# Patient Record
Sex: Female | Born: 1945 | Race: White | Hispanic: No | Smoking: Former smoker
Health system: Southern US, Community
[De-identification: ages and names within clinical notes are randomized; demographics above are authoritative.]

## PROBLEM LIST (undated history)

## (undated) DIAGNOSIS — Z87891 Personal history of nicotine dependence: Secondary | ICD-10-CM

## (undated) DIAGNOSIS — F319 Bipolar disorder, unspecified: Secondary | ICD-10-CM

## (undated) HISTORY — DX: Personal history of nicotine dependence: Z87.891

## (undated) HISTORY — PX: ABDOMINAL HYSTERECTOMY: SHX81

## (undated) HISTORY — DX: Bipolar disorder, unspecified: F31.9

---

## 1999-02-24 HISTORY — PX: HAND SURGERY: SHX662

## 2004-12-30 ENCOUNTER — Ambulatory Visit: Payer: Self-pay | Admitting: Internal Medicine

## 2006-12-23 ENCOUNTER — Ambulatory Visit: Payer: Self-pay | Admitting: Internal Medicine

## 2007-08-02 ENCOUNTER — Emergency Department: Payer: Self-pay | Admitting: Emergency Medicine

## 2007-08-12 ENCOUNTER — Emergency Department: Payer: Self-pay | Admitting: Internal Medicine

## 2008-06-11 ENCOUNTER — Ambulatory Visit: Payer: Self-pay | Admitting: Nurse Practitioner

## 2009-07-01 ENCOUNTER — Ambulatory Visit: Payer: Self-pay | Admitting: Nurse Practitioner

## 2011-01-18 ENCOUNTER — Emergency Department: Payer: Self-pay | Admitting: Internal Medicine

## 2011-09-22 ENCOUNTER — Ambulatory Visit: Payer: Self-pay

## 2013-01-10 ENCOUNTER — Ambulatory Visit: Payer: Self-pay

## 2014-01-30 ENCOUNTER — Ambulatory Visit: Payer: Self-pay

## 2014-05-17 DIAGNOSIS — Z720 Tobacco use: Secondary | ICD-10-CM | POA: Insufficient documentation

## 2014-05-17 DIAGNOSIS — F17200 Nicotine dependence, unspecified, uncomplicated: Secondary | ICD-10-CM | POA: Insufficient documentation

## 2014-08-07 ENCOUNTER — Other Ambulatory Visit: Payer: Self-pay

## 2014-08-07 DIAGNOSIS — F333 Major depressive disorder, recurrent, severe with psychotic symptoms: Secondary | ICD-10-CM

## 2014-08-07 MED ORDER — ZIPRASIDONE HCL 80 MG PO CAPS: 80.0000 mg | ORAL_CAPSULE | Freq: Two times a day (BID) | ORAL | Status: DC

## 2014-08-07 NOTE — Telephone Encounter (Signed)
pt called states she needs a refill on ziprasidone hcl 80mg . send to [facility] [address]  Pt has appt set up for 09-06-14

## 2014-08-07 NOTE — Telephone Encounter (Signed)
We'll send in a prescription for ziprasidone 80 mg, #30 with no refills. AW

## 2014-08-13 NOTE — Telephone Encounter (Signed)
PT STATES SHE RECEIVED HER RX AND IS DOING FINE.

## 2014-08-28 ENCOUNTER — Telehealth: Payer: Self-pay | Admitting: Psychiatry

## 2014-08-28 NOTE — Telephone Encounter (Signed)
Pt was given Hydroxyzine 25mg  1tid prn #150 with 1 refill on 07/09/14. She has enough supply of medication and does not need the refill at this time.   Brandy HaleUzma Anniemae Haberkorn, MD

## 2014-09-06 ENCOUNTER — Ambulatory Visit (INDEPENDENT_AMBULATORY_CARE_PROVIDER_SITE_OTHER): Payer: 59 | Admitting: Psychiatry

## 2014-09-06 ENCOUNTER — Encounter: Payer: Self-pay | Admitting: Psychiatry

## 2014-09-06 VITALS — BP 108/68 | HR 51 | Ht 60.0 in | Wt 129.2 lb

## 2014-09-06 DIAGNOSIS — F323 Major depressive disorder, single episode, severe with psychotic features: Secondary | ICD-10-CM | POA: Diagnosis not present

## 2014-09-06 MED ORDER — ZIPRASIDONE HCL 60 MG PO CAPS: 60.0000 mg | ORAL_CAPSULE | Freq: Every day | ORAL | Status: DC

## 2014-09-06 MED ORDER — HYDROXYZINE HCL 25 MG PO TABS: ORAL_TABLET | ORAL | Status: DC

## 2014-09-06 NOTE — Progress Notes (Addendum)
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 She states things continue to go well for her. She states she's not noticed any issues or problems. She states she continues to have itching. She did see a dermatologist for her itching which is localized to the scalp. Patient notes he gets better for about 24 hours when she goes to the hairdresser and then resumes. She does not report any subjective anxiety that comes along with the itching in the scalp. She states the dermatologist told her to discuss the issue with her psychiatrist. I have recommended that she perhaps get another opinion from a dermatologist given that the localized nature of the itching and the response to being in a hair salon are not entirely consistent with a somatic symptom related to anxiety.  We discussed her medication regimen for which she has been stable on for a while. However she has not exhibited any psychotic symptoms and years and her mood is remaining stable. She states in the past she's been told she has bipolar. She did have several hospitalizations but none since 2005. Given her age we've discussed whether perhaps we could decrease her dose or their need to be adjustments in her regimen. We stated that perhaps a first step would be to see if she could do well on a lower dose of Geodon.  Chief Complaint:  Visit Diagnosis:  No diagnosis found.  Past Medical History:  Past Medical History  Diagnosis Date  . Bipolar disorder     Past Surgical History  Procedure Laterality Date  . Hand surgery Right 2001    Patient not sure of what exactly was done.  Repared index finger knuckle area.   Family History: History reviewed. No pertinent family history. Social History:  History   Social History  . Marital Status: Widowed    Spouse Name: N/A  . Number of Children: N/A  . Years of  Education: N/A   Social History Main Topics  . Smoking status: Current Every Day Smoker -- 1.00 packs/day for 31 years    Types: Cigarettes  . Smokeless tobacco: Never Used  . Alcohol Use: No  . Drug Use: No  . Sexual Activity: Not Currently   Other Topics Concern  . None   Social History Narrative  . None   Additional History:   Assessment:   Musculoskeletal: Strength & Muscle Tone: within normal limits Gait & Station: normal, [name] does take small steps Patient leans: N/A  Psychiatric Specialty Exam: HPI  Review of Systems  Psychiatric/Behavioral: Negative for depression, suicidal ideas, hallucinations, memory loss and substance abuse. The patient is not nervous/anxious and does not have insomnia.     Blood pressure 108/68, pulse 51, height 5' (1.524 m), weight 129 lb 3.2 oz (58.605 kg).Body mass index is 25.23 kg/(m^2).  General Appearance: Neat and Well Groomed  Eye Contact:  Good  Speech:  Normal Rate  Volume:  Normal  Mood:  Good  Affect:  Appropriate  Thought Process:  Linear and Logical  Orientation:  Full (Time, Place, and Person)  Thought Content:  Negative  Suicidal Thoughts:  No  Homicidal Thoughts:  No  Memory:  Immediate;   Good Recent;   Good Remote;   Good  Judgement:  Good  Insight:  Good  Psychomotor Activity:  Negative  Concentration:  Good  Recall:  Good  Fund of Knowledge:  Good  Language: Good  Akathisia:  Negative  Handed:  Right unknown   AIMS (if indicated):  normal  Assets:  Communication Skills Desire for Improvement Social Support  ADL's:  Intact  Cognition: WNL  Sleep:  good   Is the patient at risk to self?  No. Has the patient been a risk to self in the past 6 months?  No. Has the patient been a risk to self within the distant past?  No. Is the patient a risk to others?  No. Has the patient been a risk to others in the past 6 months?  No. Has the patient been a risk to others within the distant past?  No.  Current  Medications: Current Outpatient Prescriptions  Medication Sig Dispense Refill  . Calcium Carbonate-Vitamin D 600-400 MG-UNIT per tablet Take by mouth.    . hydrOXYzine (ATARAX/VISTARIL) 25 MG tablet TK 1 T PO TID PRF ITCHING AND 2 TS PO QHS PRF SLEEP/ITCHING  1  . ziprasidone (GEODON) 80 MG capsule Take 1 capsule (80 mg total) by mouth 2 (two) times daily with a meal. 30 capsule 0  . ziprasidone (GEODON) 80 MG capsule Take by mouth.    . ziprasidone (GEODON) 80 MG capsule Take 80 mg by mouth.     No current facility-administered medications for this visit.    Medical Decision Making:  Established Problem, Stable/Improving (1)  Treatment Plan Summary:Medication management and Plan We will decrease the patient's ziprasidone to 60 mg a day. I've encouraged call with any questions or concerns or changes in her status with the decrease. She'll follow up in 1 month. I will also prescribe the Vistaril which she states helps her sleep. She states helps a little bit with the itching but not entirely.   Patient reports the hydroxyzine does help her sleep and helps minimally with the itching. We will provide her with a prescription for this medication.   Wallace Goinglton Mickaela Starlin 09/06/2014, 11:35 AM

## 2014-09-06 NOTE — Addendum Note (Signed)
Addended by: Kerin SalenWILLIAMS, Damyia Strider L on: 09/06/2014 11:50 AM   Modules accepted: Orders

## 2014-09-19 DIAGNOSIS — F319 Bipolar disorder, unspecified: Secondary | ICD-10-CM | POA: Insufficient documentation

## 2014-09-27 ENCOUNTER — Encounter: Payer: Self-pay | Admitting: Psychiatry

## 2014-09-27 ENCOUNTER — Ambulatory Visit (INDEPENDENT_AMBULATORY_CARE_PROVIDER_SITE_OTHER): Payer: 59 | Admitting: Psychiatry

## 2014-09-27 VITALS — BP 100/58 | HR 76 | Temp 97.8°F | Ht 60.0 in | Wt 130.8 lb

## 2014-09-27 DIAGNOSIS — F333 Major depressive disorder, recurrent, severe with psychotic symptoms: Secondary | ICD-10-CM | POA: Diagnosis not present

## 2014-09-27 MED ORDER — ZIPRASIDONE HCL 80 MG PO CAPS: 80.0000 mg | ORAL_CAPSULE | Freq: Every day | ORAL | Status: DC

## 2014-09-27 MED ORDER — HYDROXYZINE HCL 25 MG PO TABS: ORAL_TABLET | ORAL | Status: DC

## 2014-09-27 NOTE — Progress Notes (Signed)
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 At the last visit we had decreased her ziprasidone from 80 mg daily to 60 mg daily. She states that since that change she has noticed she is tired. She states she now has to take a nap pretty much every day. She states that socially she is still doing well and she is able to enjoy things but she has noticed that she is tired even in the middle of the day or late morning since the dose change. She's not noticed any other changes in her mood or perceptions or any development of psychotic symptoms.  She continues to have issues with itching and has seen a dermatologist who has prescribed a cream fluocinolone for her. She states it is not working however she states she was just given it last week.  Chief Complaint:  Visit Diagnosis:  No diagnosis found.  Past Medical History:  Past Medical History  Diagnosis Date  . Bipolar disorder     Past Surgical History  Procedure Laterality Date  . Hand surgery Right 2001    Patient not sure of what exactly was done.  Repared index finger knuckle area.  . Abdominal hysterectomy     Family History:  Family History  Problem Relation Age of Onset  . Arthritis Mother   . Heart attack Father   . Diabetes Father    Social History:  History   Social History  . Marital Status: Widowed    Spouse Name: N/A  . Number of Children: N/A  . Years of Education: N/A   Social History Main Topics  . Smoking status: Current Every Day Smoker -- 1.00 packs/day for 31 years    Types: Cigarettes    Start date: 09/27/1979  . Smokeless tobacco: Never Used  . Alcohol Use: No  . Drug Use: No  . Sexual Activity: Not Currently   Other Topics Concern  . None   Social History Narrative   Additional History:   Assessment:   Musculoskeletal: Strength & Muscle Tone: within normal  limits Gait & Station: normal, Patient does take small steps Patient leans: N/A  Psychiatric Specialty Exam: HPI  Review of Systems  Psychiatric/Behavioral: Negative for depression, suicidal ideas, hallucinations, memory loss and substance abuse. The patient has insomnia (States she sleeps about 7 hours with the Vistaril. Hypersomnia in the daytime as noted above.). The patient is not nervous/anxious.     Blood pressure 100/58, pulse 76, temperature 97.8 F (36.6 C), temperature source Tympanic, height 5' (1.524 m), weight 130 lb 12.8 oz (59.33 kg), SpO2 94 %.Body mass index is 25.54 kg/(m^2).  General Appearance: Neat and Well Groomed  Eye Contact:  Good  Speech:  Normal Rate  Volume:  Normal  Mood:  Good  Affect:  Appropriate  Thought Process:  Linear and Logical  Orientation:  Full (Time, Place, and Person)  Thought Content:  Negative  Suicidal Thoughts:  No  Homicidal Thoughts:  No  Memory:  Immediate;   Good Recent;   Good Remote;   Good  Judgement:  Good  Insight:  Good  Psychomotor Activity:  Negative  Concentration:  Good  Recall:  Good  Fund of Knowledge: Good  Language: Good  Akathisia:  Negative  Handed:  Right unknown   AIMS (if indicated):  normal  Assets:  Communication Skills Desire for Improvement Social Support  ADL's:  Intact  Cognition: WNL  Sleep:  good   Is the patient at risk to self?  No. Has the patient been a risk to self in the past 6 months?  No. Has the patient been a risk to self within the distant past?  No. Is the patient a risk to others?  No. Has the patient been a risk to others in the past 6 months?  No. Has the patient been a risk to others within the distant past?  No.  Current Medications: Current Outpatient Prescriptions  Medication Sig Dispense Refill  . Calcium Carbonate-Vitamin D 600-400 MG-UNIT per tablet Take by mouth.    . hydrOXYzine (ATARAX/VISTARIL) 25 MG tablet 1 tab 3 times daily as needed, 2 tabs at bedtime as  needed 150 tablet 1  . ziprasidone (GEODON) 60 MG capsule Take 1 capsule (60 mg total) by mouth daily with breakfast. 30 capsule 2   No current facility-administered medications for this visit.    Medical Decision Making:  Established Problem, Stable/Improving (1)  Treatment Plan Summary:Medication management and Plan As noted above the patient reports a increased daytime somnolence with the decrease from her ziprasidone from 80 mg daily to 60 ng daily. We will return her to her previous dose of 80 mg daily. She states the Vistaril has been to some extent helpful with her itching and insomnia. We will refill this medication. Thus she will get Vistaril 25 mg 4 times a day as needed for itching and 50 mg at bedtime as needed for insomnia. Patient will follow up in 2 months. She's been encouraged call with any questions or concerns prior to her next appointment.   Patient reports the hydroxyzine does help her sleep and helps minimally with the itching. We will provide her with a prescription for this medication.   Wallace Going 09/27/2014, 11:21 AM

## 2014-11-29 ENCOUNTER — Encounter: Payer: Self-pay | Admitting: Psychiatry

## 2014-11-29 ENCOUNTER — Ambulatory Visit (INDEPENDENT_AMBULATORY_CARE_PROVIDER_SITE_OTHER): Payer: 59 | Admitting: Psychiatry

## 2014-11-29 VITALS — BP 108/76 | HR 94 | Temp 97.3°F | Ht 60.0 in | Wt 133.6 lb

## 2014-11-29 DIAGNOSIS — F323 Major depressive disorder, single episode, severe with psychotic features: Secondary | ICD-10-CM
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
## 2014-12-07 ENCOUNTER — Other Ambulatory Visit: Payer: Self-pay | Admitting: Psychiatry

## 2014-12-08 LAB — LIPID PANEL W/O CHOL/HDL RATIO
CHOLESTEROL TOTAL: 218 mg/dL — AB (ref 100–199)
HDL: 60 mg/dL (ref >39)
LDL Calculated: 127 mg/dL — ABNORMAL HIGH (ref 0–99)
TRIGLYCERIDES: 153 mg/dL — AB (ref 0–149)
VLDL Cholesterol Cal: 31 mg/dL (ref 5–40)

## 2014-12-08 LAB — GLUCOSE, RANDOM: Glucose: 87 mg/dL (ref 65–99)

## 2014-12-08 LAB — PROLACTIN: PROLACTIN: 46.5 ng/mL — AB (ref 4.8–23.3)

## 2014-12-10 ENCOUNTER — Telehealth: Payer: Self-pay | Admitting: Psychiatry

## 2014-12-10 NOTE — Telephone Encounter (Signed)
Patient had her metabolic labs on 16/10/960410/14/2016. Her total cholesterol was elevated at 218, triglycerides were elevated at 153. Her glucose was 87. Her prolactin was elevated at 46.5. I contacted patient to discuss courses of action related to the elevated prolactin such as perhaps changing her to Seroquel or Abilify. Left a message for patient to contact me to discuss these issues. AW

## 2014-12-11 NOTE — Telephone Encounter (Signed)
Spoke with patient informed her laboratory results in regards to lipids and glucose. I spoke with her about her elevated prolactin. I indicated that perhaps she should come in earlier and we can discuss alternatives to Geodon to see if that medicine is contributing to her elevated prolactin at 46. Patient will come in for an earlier appointment. AW

## 2014-12-13 ENCOUNTER — Ambulatory Visit (INDEPENDENT_AMBULATORY_CARE_PROVIDER_SITE_OTHER): Payer: 59 | Admitting: Psychiatry

## 2014-12-13 ENCOUNTER — Encounter: Payer: Self-pay | Admitting: Psychiatry

## 2014-12-13 VITALS — BP 86/60 | HR 92 | Temp 97.7°F | Ht <= 58 in | Wt 133.4 lb

## 2014-12-13 DIAGNOSIS — F323 Major depressive disorder, single episode, severe with psychotic features: Secondary | ICD-10-CM | POA: Diagnosis not present

## 2014-12-13 MED ORDER — ZIPRASIDONE HCL 40 MG PO CAPS: 40.0000 mg | ORAL_CAPSULE | Freq: Every day | ORAL | Status: DC

## 2014-12-13 MED ORDER — ARIPIPRAZOLE 10 MG PO TABS: 10.0000 mg | ORAL_TABLET | Freq: Every day | ORAL | Status: DC

## 2014-12-13 NOTE — Progress Notes (Signed)
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 She comes in for this appointment early because of elevated prolactin levels on her most recent labs. Her prolactin level was 46.5 on 12/07/2014. We have made a decision that we will try to transition her to another agent to rule out the antipsychotic as a cause. Thus I have instructed her to take Geodon 40 mg daily for 7 days and then discontinue the medication. She will then start Abilify 10 mg daily. Adriana Vasquez is agreeable to this plan. She denies any physical symptoms related to hyperprolactinemia such as enlarge breasts or milk production.  Chief Complaint:  Chief Complaint    Follow-up; Medication Refill     Visit Diagnosis:   No diagnosis found.  Past Medical History:  Past Medical History  Diagnosis Date  . Bipolar disorder [facility](HCC)     Past Surgical History  Procedure Laterality Date  . Hand surgery Right 2001    Patient not sure of what exactly was done.  Repared index finger knuckle area.  . Abdominal hysterectomy     Family History:  Family History  Problem Relation Age of Onset  . Arthritis Mother   . Heart attack Father   . Diabetes Father    Social History:  Social History   Social History  . Marital Status: Widowed    Spouse Name: N/A  . Number of Children: N/A  . Years of Education: N/A   Social History Main Topics  . Smoking status: Current Every Day Smoker -- 1.00 packs/day for 31 years    Types: Cigarettes    Start date: 09/27/1979  . Smokeless tobacco: Never Used  . Alcohol Use: No  . Drug Use: No  . Sexual Activity: Not Currently   Other Topics Concern  . None   Social History Narrative   Additional History:   Assessment:   Musculoskeletal: Strength & Muscle Tone: within normal limits Gait & Station: normal, Patient does take small steps Patient leans:  N/A  Psychiatric Specialty Exam: HPI  Review of Systems  Skin: Positive for itching.  Psychiatric/Behavioral: Negative for depression, suicidal ideas, hallucinations, memory loss and substance abuse. The patient has insomnia (States she sleeps about 7 hours with the Vistaril. Hypersomnia in the daytime as noted above.). The patient is not nervous/anxious.   All other systems reviewed and are negative.   Blood pressure 86/60, pulse 92, temperature 97.7 F (36.5 C), temperature source Tympanic, height 4\' 9"  (1.448 m), weight 133 lb 6.4 oz (60.51 kg), SpO2 92 %.Body mass index is 28.86 kg/(m^2).  General Appearance: Neat and Well Groomed  Eye Contact:  Good  Speech:  Normal Rate  Volume:  Normal  Mood:  Good  Affect:  Appropriate  Thought Process:  Linear and Logical  Orientation:  Full (Time, Place, and Person)  Thought Content:  Negative  Suicidal Thoughts:  No  Homicidal Thoughts:  No  Memory:  Immediate;   Good Recent;   Good Remote;   Good  Judgement:  Good  Insight:  Good  Psychomotor Activity:  Negative  Concentration:  Good  Recall:  Good  Fund of Knowledge: Good  Language: Good  Akathisia:  Negative  Handed:  Right unknown   AIMS (if indicated):  normal  Assets:  Communication Skills Desire for Improvement Social Support  ADL's:  Intact  Cognition: WNL  Sleep:  good  Is the patient at risk to self?  No. Has the patient been a risk to self in the past 6 months?  No. Has the patient been a risk to self within the distant past?  No. Is the patient a risk to others?  No. Has the patient been a risk to others in the past 6 months?  No. Has the patient been a risk to others within the distant past?  No.  Current Medications: Current Outpatient Prescriptions  Medication Sig Dispense Refill  . Calcium Carbonate-Vitamin D 600-400 MG-UNIT per tablet Take by mouth.    . hydrOXYzine (ATARAX/VISTARIL) 25 MG tablet Take 1 capsule 4 times a day as needed for  anxiety/itching. Take 2 capsules at bedtime as needed for sleep. 180 tablet 4  . ziprasidone (GEODON) 40 MG capsule Take 1 capsule (40 mg total) by mouth daily after lunch. Take with food. Take this for seven days then discontinue Geodon. 7 capsule 0  . ARIPiprazole (ABILIFY) 10 MG tablet Take 1 tablet (10 mg total) by mouth daily. START THE DAY AFTER LAST DOSE OF GEODON 30 tablet 1   No current facility-administered medications for this visit.    Medical Decision Making:  Established Problem, Stable/Improving (1)  Treatment Plan Summary:Medication management and Plan   Major depressive disorder, recurrent severe with psychotic features. We will discontinue her Geodon. She will taper off of the medication taking 40 mg daily for 7 days and then stop it. She will then start Abilify 10 mg daily. Risk and benefits have been discussing patient's able to consent.   Insomnia-  hydroxyzine does help her sleep and helps minimally with the itching. We will provide her with a prescription for this medication.  Patient will follow up in 1 months. She's been encouraged call any questions or concerns prior to her next appointment.   Wallace Going 12/13/2014, 4:37 PM

## 2014-12-31 ENCOUNTER — Other Ambulatory Visit: Payer: Self-pay | Admitting: Infectious Diseases

## 2014-12-31 DIAGNOSIS — Z1231 Encounter for screening mammogram for malignant neoplasm of breast: Secondary | ICD-10-CM

## 2015-01-14 ENCOUNTER — Ambulatory Visit (INDEPENDENT_AMBULATORY_CARE_PROVIDER_SITE_OTHER): Payer: 59 | Admitting: Psychiatry

## 2015-01-14 ENCOUNTER — Encounter: Payer: Self-pay | Admitting: Psychiatry

## 2015-01-14 VITALS — BP 122/70 | HR 94 | Temp 97.8°F | Ht <= 58 in | Wt 135.0 lb

## 2015-01-14 DIAGNOSIS — F333 Major depressive disorder, recurrent, severe with psychotic symptoms: Secondary | ICD-10-CM

## 2015-01-14 MED ORDER — ARIPIPRAZOLE 10 MG PO TABS: 10.0000 mg | ORAL_TABLET | Freq: Every day | ORAL | Status: DC

## 2015-01-14 NOTE — Patient Instructions (Signed)
HAVE PROLACTIN LAB DONE ON OR AFTER February 06, 2015

## 2015-01-14 NOTE — Progress Notes (Addendum)
BH MD/PA/NP OP Progress Note  01/14/2015 10:34 AM Adriana Vasquez  MRN:  [id number]  Subjective:  Patient presents for follow-up for major depressive disorder with psychotic features. Patient states that she is doing well on the Abilify. Her Geodon was discontinued due to an elevation in prolactin. She states that she has plans for the holidays and she will travel to see her brother. She states an aunt will company her for this trip. She denies any emotional issues at this time. Chief Complaint:  Chief Complaint    Follow-up; Medication Refill     Visit Diagnosis:     ICD-9-CM ICD-10-CM   1. 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 Bipolar disorder [facility])     Past Surgical History  Procedure Laterality Date  . Hand surgery Right 2001    Patient not sure of what exactly was done.  Repared index finger knuckle area.  . Abdominal hysterectomy     Family History:  Family History  Problem Relation Age of Onset  . Arthritis Mother   . Heart attack Father   . Diabetes Father    Social History:  Social History   Social History  . Marital Status: Widowed    Spouse Name: N/A  . Number of Children: N/A  . Years of Education: N/A   Social History Main Topics  . Smoking status: Current Every Day Smoker -- 1.00 packs/day for 31 years    Types: Cigarettes    Start date: 09/27/1979  . Smokeless tobacco: Never Used  . Alcohol Use: No  . Drug Use: No  . Sexual Activity: Not Currently   Other Topics Concern  . None   Social History Narrative   Additional History:   Assessment:   Musculoskeletal: Strength & Muscle Tone: within normal limits Gait & Station: normal, Patient does take small steps Patient leans: N/A  Psychiatric Specialty Exam: HPI  Review of Systems  Skin: Positive for itching.  Psychiatric/Behavioral: Negative for depression, suicidal ideas,  hallucinations, memory loss and substance abuse. The patient is not nervous/anxious and does not have insomnia (States she sleeps about 7 hours with the Vistaril. Hypersomnia in the daytime as noted above.).   All other systems reviewed and are negative.   Blood pressure 122/70, pulse 94, temperature 97.8 F (36.6 C), temperature source Tympanic, height  (1.448 m), weight 135 lb (61.236 kg), SpO2 92 %.Body mass index is 29.21 kg/(m^2).  General Appearance: Neat and Well Groomed  Eye Contact:  Good  Speech:  Normal Rate  Volume:  Normal  Mood:  Good  Affect:  Congruent  Thought Process:  Linear and Logical  Orientation:  Full (Time, Place, and Person)  Thought Content:  Negative  Suicidal Thoughts:  No  Homicidal Thoughts:  No  Memory:  Immediate;   Good Recent;   Good Remote;   Good  Judgement:  Good  Insight:  Good  Psychomotor Activity:  Negative  Concentration:  Good  Recall:  Good  Fund of Knowledge: Good  Language: Good  Akathisia:  Negative  Handed:  Right unknown   AIMS (if indicated):  Done on 01/14/15 normal  Assets:  Communication Skills Desire for Improvement Social Support  ADL's:  Intact  Cognition: WNL  Sleep:  good   Is the patient at risk to self?  No. Has the patient been a risk to self in the past  6 months?  No. Has the patient been a risk to self within the distant past?  No. Is the patient a risk to others?  No. Has the patient been a risk to others in the past 6 months?  No. Has the patient been a risk to others within the distant past?  No.  Current Medications: Current Outpatient Prescriptions  Medication Sig Dispense Refill  . ARIPiprazole (ABILIFY) 10 MG tablet Take 1 tablet (10 mg total) by mouth daily. 30 tablet 2  . Calcium Carbonate-Vitamin D 600-400 MG-UNIT per tablet Take by mouth.    . hydrOXYzine (ATARAX/VISTARIL) 25 MG tablet Take 1 capsule 4 times a day as needed for anxiety/itching. Take 2 capsules at bedtime as needed for  sleep. 180 tablet 4  . PREVNAR 13 SUSP injection INJECT UTD  0   No current facility-administered medications for this visit.    Medical Decision Making:  Established Problem, Stable/Improving (1)  Treatment Plan Summary:Medication management and Plan   Major depressive disorder, recurrent severe with psychotic features. She'll continue the Abilify 10 mg daily.  Insomnia-  hydroxyzine does help her sleep and helps minimally with the itching. We will provide her with a prescription for this medication.   Given her prescription with a laboratory order for a prolactin. Her last prolactin was done on 12/07/2014 and was 46.5. I given instructions to have this lab done on her after 02/06/2015.  Patient will follow up in 2 months. She's been encouraged call any questions or concerns prior to her next appointment.   Wallace Goinglton Evvie Behrmann 01/14/2015, 10:34 AM

## 2015-01-22 ENCOUNTER — Other Ambulatory Visit: Payer: Self-pay | Admitting: Family Medicine

## 2015-01-22 ENCOUNTER — Encounter: Payer: Self-pay | Admitting: Family Medicine

## 2015-01-22 DIAGNOSIS — Z87891 Personal history of nicotine dependence: Secondary | ICD-10-CM | POA: Insufficient documentation

## 2015-01-22 HISTORY — DX: Personal history of nicotine dependence: Z87.891

## 2015-01-23 ENCOUNTER — Ambulatory Visit: Admission: RE | Admit: 2015-01-23 | Payer: Medicare Other | Source: Ambulatory Visit

## 2015-01-23 ENCOUNTER — Encounter: Payer: Self-pay | Admitting: Family Medicine

## 2015-01-23 ENCOUNTER — Inpatient Hospital Stay: Payer: Medicare Other | Attending: Family Medicine | Admitting: Family Medicine

## 2015-01-23 NOTE — Progress Notes (Signed)
Called pharmacy discontinued medication on  01-14-15 due to elevation in prolactin

## 2015-01-25 ENCOUNTER — Encounter: Payer: Self-pay | Admitting: Family Medicine

## 2015-01-25 ENCOUNTER — Ambulatory Visit
Admission: RE | Admit: 2015-01-25 | Discharge: 2015-01-25 | Disposition: A | Discharge status: Home / Self Care | Payer: Medicare Other | Source: Ambulatory Visit | Attending: Family Medicine | Admitting: Family Medicine

## 2015-01-25 ENCOUNTER — Inpatient Hospital Stay: Payer: Medicare Other | Attending: Family Medicine | Admitting: Family Medicine

## 2015-01-25 ENCOUNTER — Other Ambulatory Visit: Payer: Self-pay | Admitting: Family Medicine

## 2015-01-25 DIAGNOSIS — Z87891 Personal history of nicotine dependence: Secondary | ICD-10-CM | POA: Insufficient documentation

## 2015-01-25 DIAGNOSIS — I2510 Atherosclerotic heart disease of native coronary artery without angina pectoris: Secondary | ICD-10-CM | POA: Insufficient documentation

## 2015-01-25 DIAGNOSIS — Z122 Encounter for screening for malignant neoplasm of respiratory organs: Secondary | ICD-10-CM | POA: Diagnosis not present

## 2015-01-25 NOTE — Progress Notes (Addendum)
In accordance with CMS guidelines, patient has meet eligibility criteria including age, absence of signs or symptoms of lung cancer, the specific calculation of cigarette smoking pack-years was 35 years and is a current smoker.   A shared decision-making session was conducted prior to the performance of CT scan. This includes one or more decision aids, includes benefits and harms of screening, follow-up diagnostic testing, over-diagnosis, false positive rate, and total radiation exposure.  Counseling on the importance of adherence to annual lung cancer LDCT screening, impact of co-morbidities, and ability or willingness to undergo diagnosis and treatment is imperative for compliance of the program.  Counseling on the importance of continued smoking cessation for former smokers; the importance of smoking cessation for current smokers and information about tobacco cessation interventions have been given to patient including the [place] at [facility], 1800 quit , as well as Cancer Center specific smoking cessation programs.  Written order for lung cancer screening with LDCT has been given to the patient and any and all questions have been answered to the best of my abilities.   Yearly follow up will be scheduled by Shawn Perkins, Thoracic Navigator.   

## 2015-01-28 ENCOUNTER — Telehealth: Payer: Self-pay | Admitting: *Deleted

## 2015-01-28 NOTE — Telephone Encounter (Signed)
Notified patient of LDCT lung cancer screening results of Lung Rads 2S finding with recommendation for 12 month follow up imaging. Also notified of incidental finding noted below. Patient verbalizes understanding.   IMPRESSION: 1. Lung-RADS Category 2S, benign appearance or behavior. Continue annual screening with low-dose chest CT without contrast in 12 months. 2. The "S" modifier above refers to potentially clinically significant non lung cancer related findings. Specifically, there is atherosclerosis, including left anterior descending coronary artery disease. Please note that although the presence of coronary artery calcium documents the presence of coronary artery disease, the severity of this disease and any potential stenosis cannot be assessed on this non-gated CT examination. Assessment for potential risk factor modification, dietary therapy or pharmacologic therapy may be warranted, if clinically indicated.

## 2015-02-04 ENCOUNTER — Ambulatory Visit
Admission: RE | Admit: 2015-02-04 | Discharge: 2015-02-04 | Disposition: A | Discharge status: Home / Self Care | Payer: Medicare Other | Source: Ambulatory Visit | Attending: Infectious Diseases | Admitting: Infectious Diseases

## 2015-02-04 ENCOUNTER — Other Ambulatory Visit: Payer: Self-pay | Admitting: Infectious Diseases

## 2015-02-04 DIAGNOSIS — Z1231 Encounter for screening mammogram for malignant neoplasm of breast: Secondary | ICD-10-CM

## 2015-02-07 ENCOUNTER — Other Ambulatory Visit: Payer: Self-pay | Admitting: Psychiatry

## 2015-02-08 LAB — PROLACTIN: Prolactin: 26.7 ng/mL — ABNORMAL HIGH (ref 4.8–23.3)

## 2015-02-11 ENCOUNTER — Telehealth: Payer: Self-pay | Admitting: Psychiatry

## 2015-02-11 NOTE — Telephone Encounter (Signed)
Patient today and informed her that her prolactin level had decreased to 26.7. She had an elevated prolactin while on Geodon and now has been on Abilify. I explained to patient that perhaps at her next appointment in January we will check this again to see if it decreases further. AW

## 2015-02-28 ENCOUNTER — Ambulatory Visit: Payer: Self-pay | Admitting: Psychiatry

## 2015-03-13 ENCOUNTER — Encounter: Payer: Self-pay | Admitting: Psychiatry

## 2015-03-13 ENCOUNTER — Ambulatory Visit (INDEPENDENT_AMBULATORY_CARE_PROVIDER_SITE_OTHER): Payer: 59 | Admitting: Psychiatry

## 2015-03-13 VITALS — BP 100/62 | HR 87 | Temp 97.3°F | Ht 60.0 in | Wt 132.6 lb

## 2015-03-13 DIAGNOSIS — F333 Major depressive disorder, recurrent, severe with psychotic symptoms: Secondary | ICD-10-CM

## 2015-03-13 MED ORDER — HYDROXYZINE HCL 25 MG PO TABS: ORAL_TABLET | ORAL | Status: DC

## 2015-03-13 MED ORDER — ARIPIPRAZOLE 10 MG PO TABS: 10.0000 mg | ORAL_TABLET | Freq: Every day | ORAL | Status: DC

## 2015-03-13 NOTE — Progress Notes (Signed)
BH MD/PA/NP OP Progress Note  03/13/2015 10:25 AM Adriana Vasquez  MRN:  [id number]  Subjective:  Patient presents for follow-up for major depressive disorder with psychotic features. Patient indicated that she's been doing well. 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 She indicates the itching may be a little bit better. When asked what she thinks is made her itching better she states "maybe the Abilify." She states she had a recent scare because she had to get evaluated for possible lung cancer however she states it's turned out she's been told it is not and she is relieved by this.  Discussed with her that her prolactin was trending down and that it was closer to the normal range, now at 26.7 in December versus being 46.5 in October 2016. I stated we should follow-up on this. Patient states that due to finances she did not want to go for a lab until her insurance will pay for. She states she has a primary care appointment in May 2017 and I've given her a lab slip to have it reassessed then.  Chief Complaint:  Chief Complaint    Follow-up; Medication Refill; Other     Visit Diagnosis:     ICD-9-CM ICD-10-CM   1. 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 Bipolar disorder (HCC)   . Personal history of tobacco use, presenting hazards to health 01/22/2015    Past Surgical History  Procedure Laterality Date  . Hand surgery Right 2001    Patient not sure of what exactly was done.  Repared index finger knuckle area.  . Abdominal hysterectomy     Family History:  Family History  Problem Relation Age of Onset  . Arthritis Mother   . Heart attack Father   . Diabetes Father   . Breast cancer Neg Hx    Social History:  Social History   Social History  . Marital Status: Widowed    Spouse Name: N/A  . Number of Children: N/A  . Years of Education: N/A   Social  History Main Topics  . Smoking status: Current Every Day Smoker -- 1.00 packs/day for 35 years    Types: Cigarettes    Start date: 09/27/1979  . Smokeless tobacco: Never Used  . Alcohol Use: No  . Drug Use: No  . Sexual Activity: Not Currently   Other Topics Concern  . None   Social History Narrative   Additional History:   Assessment:   Musculoskeletal: Strength & Muscle Tone: within normal limits Gait & Station: normal, Patient does take small steps Patient leans: N/A  Psychiatric Specialty Exam: HPI  Review of Systems  Skin: Positive for itching.  Psychiatric/Behavioral: Negative for depression, suicidal ideas, hallucinations, memory loss and substance abuse. The patient is not nervous/anxious and does not have insomnia (States she sleeps about 7 hours with the Vistaril. Hypersomnia in the daytime as noted above.).   All other systems reviewed and are negative.   Blood pressure 100/62, pulse 87, temperature 97.3 F (36.3 C), temperature source Tympanic, height 5' (1.524 m), weight 132 lb 9.6 oz (60.147 kg), SpO2 92 %.Body mass index is 25.9 kg/(m^2).  General Appearance: Neat and Well Groomed  Eye Contact:  Good  Speech:  Normal Rate  Volume:  Normal  Mood:  Good  Affect:  Congruent  Thought Process:  Linear and Logical  Orientation:  Full (Time, Place,  and Person)  Thought Content:  Negative  Suicidal Thoughts:  No  Homicidal Thoughts:  No  Memory:  Immediate;   Good Recent;   Good Remote;   Good  Judgement:  Good  Insight:  Good  Psychomotor Activity:  Negative  Concentration:  Good  Recall:  Good  Fund of Knowledge: Good  Language: Good  Akathisia:  Negative  Handed:  Right unknown   AIMS (if indicated):  Done on 01/14/15 normal  Assets:  Communication Skills Desire for Improvement Social Support  ADL's:  Intact  Cognition: WNL  Sleep:  good   Is the patient at risk to self?  No. Has the patient been a risk to self in the past 6 months?  No. Has  the patient been a risk to self within the distant past?  No. Is the patient a risk to others?  No. Has the patient been a risk to others in the past 6 months?  No. Has the patient been a risk to others within the distant past?  No.  Current Medications: Current Outpatient Prescriptions  Medication Sig Dispense Refill  . ARIPiprazole (ABILIFY) 10 MG tablet Take 1 tablet (10 mg total) by mouth daily. 30 tablet 4  . Calcium Carbonate-Vitamin D 600-400 MG-UNIT per tablet Take by mouth.    . hydrOXYzine (ATARAX/VISTARIL) 25 MG tablet Take 1 capsule 4 times a day as needed for anxiety/itching. Take 2 capsules at bedtime as needed for sleep. 180 tablet 4   No current facility-administered medications for this visit.    Medical Decision Making:  Established Problem, Stable/Improving (1)  Treatment Plan Summary:Medication management and Plan   Major depressive disorder, recurrent severe with psychotic features. She'll continue the Abilify 10 mg daily.  Insomnia-  hydroxyzine does help her sleep and helps minimally with the itching. We will provide her with a prescription for this medication.   Given her prescription with a laboratory order for a prolactin.   Patient will follow up in 3 months. She's been encouraged call any questions or concerns prior to her next appointment. I've informed her that I am leaving the practice but that she can follow up with a another doctor within this practice.   Wallace Going 03/13/2015, 10:25 AM

## 2015-03-13 NOTE — Patient Instructions (Signed)
HAVE PROLACTIN CHECKED AT NEXT PCP APPOINTMENT IN MAY 2017

## 2015-06-04 ENCOUNTER — Ambulatory Visit: Payer: 59 | Admitting: Psychiatry

## 2015-06-11 ENCOUNTER — Ambulatory Visit: Payer: 59 | Admitting: Psychiatry

## 2015-06-19 ENCOUNTER — Ambulatory Visit: Payer: 59 | Admitting: Psychiatry

## 2015-06-19 ENCOUNTER — Ambulatory Visit (INDEPENDENT_AMBULATORY_CARE_PROVIDER_SITE_OTHER): Payer: 59 | Admitting: Psychiatry

## 2015-06-19 ENCOUNTER — Encounter: Payer: Self-pay | Admitting: Psychiatry

## 2015-06-19 VITALS — BP 110/82 | HR 78 | Temp 97.9°F | Ht 60.0 in | Wt 134.2 lb

## 2015-06-19 DIAGNOSIS — F3131 Bipolar disorder, current episode depressed, mild: Secondary | ICD-10-CM

## 2015-06-19 MED ORDER — ARIPIPRAZOLE 10 MG PO TABS: 5.0000 mg | ORAL_TABLET | Freq: Every day | ORAL | Status: DC

## 2015-06-19 NOTE — Progress Notes (Signed)
BH MD/PA/NP OP Progress Note  06/19/2015 1:32 PM Adriana Vasquez  MRN:  [id number]  Subjective:  Patient is a 70 year old female with history of bipolar disorder who presented for the follow-up appointment. She was previously following Dr. Mayford Knife. She appeared well-groomed and reported that she has been compliant with her medications. She reported that the itching in her head has stopped. She was happy about the same. She reported that she has a weird gait since she has been started on the medications. She spends time shopping as she enjoys feeling different types of jewelry. She reported that her husband used to have custom jewelry shop but now he is deceased for the past 4 years. She reported that she will go to the mall and will buy different stuff. Patient reported that her sister lives in [place] in a nursing home and her brother is in [place]. She currently lives by herself. She reported that she does not feel depressed. She sleeps around midnight. She has been sleeping for 7-8 hours.     Chief Complaint:  Chief Complaint    Follow-up; Medication Refill     Visit Diagnosis:     ICD-9-CM ICD-10-CM   1. MDD (major depressive disorder), recurrent episode, moderate (HCC) 296.32 F33.1     Past Medical History:  Past Medical History  Diagnosis Date  . Bipolar disorder (HCC)   . Personal history of tobacco use, presenting hazards to health 01/22/2015    Past Surgical History  Procedure Laterality Date  . Hand surgery Right 2001    Patient not sure of what exactly was done.  Repared index finger knuckle area.  . Abdominal hysterectomy     Family History:  Family History  Problem Relation Age of Onset  . Arthritis Mother   . Heart attack Father   . Diabetes Father   . Breast cancer Neg Hx    Social History:  Social History   Social History  . Marital Status: Widowed    Spouse Name: N/A  . Number of Children: N/A  . Years of Education: N/A   Social History Main  Topics  . Smoking status: Current Every Day Smoker -- 1.00 packs/day for 35 years    Types: Cigarettes    Start date: 09/27/1979  . Smokeless tobacco: Never Used  . Alcohol Use: No  . Drug Use: No  . Sexual Activity: Not Currently   Other Topics Concern  . None   Social History Narrative   Additional History:     Assessment:   Musculoskeletal: Strength & Muscle Tone: within normal limits Gait & Station: normal, Patient does take small steps Patient leans: N/A  Psychiatric Specialty Exam: HPI  Review of Systems  Skin: Positive for itching.  Psychiatric/Behavioral: Negative for depression, suicidal ideas, hallucinations, memory loss and substance abuse. The patient is not nervous/anxious and does not have insomnia (States she sleeps about 7 hours with the Vistaril. Hypersomnia in the daytime as noted above.).   All other systems reviewed and are negative.   Blood pressure 110/82, pulse 78, temperature 97.9 F (36.6 C), temperature source Tympanic, height 5' (1.524 m), weight 134 lb 3.2 oz (60.873 kg), SpO2 87 %.Body mass index is 26.21 kg/(m^2).  General Appearance: Neat and Well Groomed  Eye Contact:  Good  Speech:  Normal Rate  Volume:  Normal  Mood:  Good  Affect:  Congruent  Thought Process:  Linear and Logical  Orientation:  Full (Time, Place, and Person)  Thought Content:  Negative  Suicidal Thoughts:  No  Homicidal Thoughts:  No  Memory:  Immediate;   Good Recent;   Good Remote;   Good  Judgement:  Good  Insight:  Good  Psychomotor Activity:  Negative  Concentration:  Good  Recall:  Good  Fund of Knowledge: Good  Language: Good  Akathisia:  Negative  Handed:  Right unknown   AIMS (if indicated):  Done on 01/14/15 normal  Assets:  Communication Skills Desire for Improvement Social Support  ADL's:  Intact  Cognition: WNL  Sleep:  good   Is the patient at risk to self?  No. Has the patient been a risk to self in the past 6 months?  No. Has the  patient been a risk to self within the distant past?  No. Is the patient a risk to others?  No. Has the patient been a risk to others in the past 6 months?  No. Has the patient been a risk to others within the distant past?  No.  Current Medications: Current Outpatient Prescriptions  Medication Sig Dispense Refill  . ARIPiprazole (ABILIFY) 10 MG tablet Take 1 tablet (10 mg total) by mouth daily. 30 tablet 4  . Calcium Carbonate-Vitamin D 600-400 MG-UNIT per tablet Take by mouth.    . hydrOXYzine (ATARAX/VISTARIL) 25 MG tablet Take 1 capsule 4 times a day as needed for anxiety/itching. Take 2 capsules at bedtime as needed for sleep. 180 tablet 4   No current facility-administered medications for this visit.    Medical Decision Making:  Established Problem, Stable/Improving (1)  Treatment Plan Summary:Medication management and Plan   Discussed with patient about her medications. I will decrease Abilify 5 mg daily as she is currently stable and is experiencing some adverse reactions leading to gait instability  Insomnia-  hydroxyzine does help her sleep and helps minimally with the itching. We will provide her with a prescription for this medication.    Patient will follow up in 1 months.    More than 50% of the time spent in psychoeducation, counseling and coordination of care.    This note was generated in part or whole with voice recognition software. Voice regonition is usually quite accurate but there are transcription errors that can and very often do occur. I apologize for any typographical errors that were not detected and corrected.   Brandy HaleUzma Jeanetta Alonzo, MD  06/19/2015, 1:32 PM

## 2015-07-01 ENCOUNTER — Telehealth: Payer: Self-pay

## 2015-07-01 NOTE — Telephone Encounter (Signed)
received a fax [facility] Sam[facility]itzgerald [facility] lab[facility].  Will put labwork results on your desk please review and signe off on for scaning.

## 2015-07-01 NOTE — Telephone Encounter (Signed)
Labs reviewed. Her Prolactin level is WNL- 18.7

## 2015-07-17 ENCOUNTER — Ambulatory Visit (INDEPENDENT_AMBULATORY_CARE_PROVIDER_SITE_OTHER): Payer: 59 | Admitting: Psychiatry

## 2015-07-17 ENCOUNTER — Encounter: Payer: Self-pay | Admitting: Psychiatry

## 2015-07-17 VITALS — BP 102/68 | HR 86 | Temp 97.3°F | Wt 134.2 lb

## 2015-07-17 DIAGNOSIS — F3131 Bipolar disorder, current episode depressed, mild: Secondary | ICD-10-CM | POA: Diagnosis not present

## 2015-07-17 MED ORDER — ARIPIPRAZOLE 5 MG PO TABS: 5.0000 mg | ORAL_TABLET | Freq: Every day | ORAL | Status: DC

## 2015-07-17 MED ORDER — HYDROXYZINE HCL 25 MG PO TABS: ORAL_TABLET | ORAL | Status: DC

## 2015-07-17 NOTE — Progress Notes (Signed)
BH MD/PA/NP OP Progress Note  07/17/2015 10:30 AM Adriana Vasquez  MRN:  [id number]  Subjective:  Patient is a 70 year old female with history of bipolar disorder who presented for the follow-up appointment.  She appeared well-groomed and reported that she has been compliant with her medications. I have seen her a month ago and we have decreased the dose of her Abilify to 5 mg daily. Patient appeared well-groomed during the interview. She has responded well to the decrease in the dose of Abilify and is not experiencing any adverse effects. She appeared more alert. She reported that she likes shopping and goes [place]  but is waiting for the sale. Patient reported that she also decorates her house for every occasions. She has taken out the flag for the [facility]. She reported that she goes out shopping shopping only 2 days per week. She reported that she has cleaned her house yesterday and will go out for lunch one day a week with her aunt. She stated that she has a chronic itching in her head which is resolved with the help of the Atarax. She has been compliant with her medications. She currently denied having any suicidal homicidal ideations or plans. Her family lives in [place] and she [place] she thinks that the trucks  are driving fast on the highway. She reported that the itching in her head has stopped. She was happy about the same.  Patient reported that her sister lives in [place] in a nursing home and her brother is in [place]. She currently lives by herself.   Chief Complaint:  Chief Complaint    Follow-up; Medication Refill     Visit Diagnosis:     ICD-9-CM ICD-10-CM   1. Bipolar affective disorder, currently depressed, mild (HCC) 296.51 F31.31     Past Medical History:  Past Medical History  Diagnosis Date  . Bipolar disorder (HCC)   . Personal history of tobacco use, presenting hazards to health 01/22/2015    Past Surgical  History  Procedure Laterality Date  . Hand surgery Right 2001    Patient not sure of what exactly was done.  Repared index finger knuckle area.  . Abdominal hysterectomy     Family History:  Family History  Problem Relation Age of Onset  . Arthritis Mother   . Heart attack Father   . Diabetes Father   . Breast cancer Neg Hx    Social History:  Social History   Social History  . Marital Status: Widowed    Spouse Name: N/A  . Number of Children: N/A  . Years of Education: N/A   Social History Main Topics  . Smoking status: Current Every Day Smoker -- 1.00 packs/day for 35 years    Types: Cigarettes    Start date: 09/27/1979  . Smokeless tobacco: Never Used  . Alcohol Use: No  . Drug Use: No  . Sexual Activity: Not Currently   Other Topics Concern  . None   Social History Narrative   Additional History:     Assessment:   Musculoskeletal: Strength & Muscle Tone: within normal limits Gait & Station: normal, Patient does take small steps Patient leans: N/A  Psychiatric Specialty Exam: HPI  Review of Systems  Skin: Positive for itching.  Psychiatric/Behavioral: Negative for depression, suicidal ideas, hallucinations, memory loss and substance abuse. The patient is not nervous/anxious and does not have insomnia (States she sleeps about 7 hours with the Vistaril. Hypersomnia in  the daytime as noted above.).   All other systems reviewed and are negative.   Blood pressure 102/68, pulse 86, temperature 97.3 F (36.3 C), temperature source Tympanic, weight 134 lb 3.2 oz (60.873 kg), SpO2 92 %.Body mass index is 26.21 kg/(m^2).  General Appearance: Neat and Well Groomed  Eye Contact:  Good  Speech:  Normal Rate  Volume:  Normal  Mood:  Good  Affect:  Congruent  Thought Process:  Linear and Logical  Orientation:  Full (Time, Place, and Person)  Thought Content:  Negative  Suicidal Thoughts:  No  Homicidal Thoughts:  No  Memory:  Immediate;   Good Recent;    Good Remote;   Good  Judgement:  Good  Insight:  Good  Psychomotor Activity:  Negative  Concentration:  Good  Recall:  Good  Fund of Knowledge: Good  Language: Good  Akathisia:  Negative  Handed:  Right unknown   AIMS (if indicated):  Done on 01/14/15 normal  Assets:  Communication Skills Desire for Improvement Social Support  ADL's:  Intact  Cognition: WNL  Sleep:  good   Is the patient at risk to self?  No. Has the patient been a risk to self in the past 6 months?  No. Has the patient been a risk to self within the distant past?  No. Is the patient a risk to others?  No. Has the patient been a risk to others in the past 6 months?  No. Has the patient been a risk to others within the distant past?  No.  Current Medications: Current Outpatient Prescriptions  Medication Sig Dispense Refill  . ARIPiprazole (ABILIFY) 10 MG tablet Take 0.5 tablets (5 mg total) by mouth daily. Pt has supply 30 tablet 4  . Calcium Carbonate-Vitamin D 600-400 MG-UNIT per tablet Take by mouth.    . hydrOXYzine (ATARAX/VISTARIL) 25 MG tablet Take 1 capsule 4 times a day as needed for anxiety/itching. Take 2 capsules at bedtime as needed for sleep. 180 tablet 4   No current facility-administered medications for this visit.    Medical Decision Making:  Established Problem, Stable/Improving (1)  Treatment Plan Summary:Medication management and Plan   Continue Abilify 5 mg daily as she is currently stable   Insomnia-  hydroxyzine does help her sleep and helps minimally with the itching. We will provide her with a prescription for this medication.    Patient will follow up in 2 months.    More than 50% of the time spent in psychoeducation, counseling and coordination of care.    This note was generated in part or whole with voice recognition software. Voice regonition is usually quite accurate but there are transcription errors that can and very often do occur. I apologize for any typographical  errors that were not detected and corrected.   Brandy Hale, MD  07/17/2015, 10:30 AM

## 2015-07-25 ENCOUNTER — Other Ambulatory Visit: Payer: Self-pay | Admitting: Psychiatry

## 2015-07-25 ENCOUNTER — Telehealth: Payer: Self-pay

## 2015-07-25 MED ORDER — ARIPIPRAZOLE 5 MG PO TABS: 5.0000 mg | ORAL_TABLET | Freq: Every day | ORAL | Status: DC

## 2015-07-25 NOTE — Telephone Encounter (Signed)
per lea pt called states that she needs refill on her medication. pt last seen on  07-17-15 next appt  09-16-15

## 2015-09-16 ENCOUNTER — Ambulatory Visit: Payer: 59 | Admitting: Psychiatry

## 2015-09-18 ENCOUNTER — Encounter: Payer: Self-pay | Admitting: Psychiatry

## 2015-09-18 ENCOUNTER — Ambulatory Visit (INDEPENDENT_AMBULATORY_CARE_PROVIDER_SITE_OTHER): Payer: 59 | Admitting: Psychiatry

## 2015-09-18 VITALS — BP 103/61 | HR 83 | Temp 98.4°F | Ht 60.0 in | Wt 135.2 lb

## 2015-09-18 DIAGNOSIS — F3131 Bipolar disorder, current episode depressed, mild: Secondary | ICD-10-CM

## 2015-09-18 MED ORDER — TRAZODONE HCL 50 MG PO TABS: 50.0000 mg | ORAL_TABLET | Freq: Every day | ORAL | 1 refills | Status: DC

## 2015-09-18 MED ORDER — ARIPIPRAZOLE 5 MG PO TABS: 5.0000 mg | ORAL_TABLET | Freq: Every day | ORAL | 1 refills | Status: DC

## 2015-09-18 MED ORDER — HYDROXYZINE HCL 25 MG PO TABS: ORAL_TABLET | ORAL | 1 refills | Status: DC

## 2015-09-18 NOTE — Progress Notes (Signed)
BH MD/PA/NP OP Progress Note  09/18/2015 10:31 AM Adriana Vasquez  MRN:  [id number]  Subjective:  Patient is a 70 year old female with history of bipolar disorder who presented for the follow-up appointment.  She appeared well-groomed and reported that she has been compliant with her medications. Patient has been going to the gym and neck sizing. Patient reported that she is doing well since the dose of Abilify has been decreased. She currently has been compliant with her medication. She also takes Vistaril 4 times daily as she is scared that she might have return of her age but she is not having any itches at this time. She has been taking care of herself and has been well-groomed. She likes wearing clothes and jewelry. She reported that she cleans her house and talks to her friends and her family members as her brother lives close to [facility]. She currently denied having any suicidal ideations or plans. She reported that she has some problem with sleep and would like to take the medication for the same. We discussed about the medications. She appeared calm and collective during the interview. She denied having any suicidal homicidal ideations or plans.     Chief Complaint:  Chief Complaint    Follow-up; Medication Refill     Visit Diagnosis:     ICD-9-CM ICD-10-CM   1. Bipolar affective disorder, currently depressed, mild (HCC) 296.51 F31.31     Past Medical History:  Past Medical History:  Diagnosis Date  . Bipolar disorder (HCC)   . Personal history of tobacco use, presenting hazards to health 01/22/2015    Past Surgical History:  Procedure Laterality Date  . ABDOMINAL HYSTERECTOMY    . HAND SURGERY Right 2001   Patient not sure of what exactly was done.  Repared index finger knuckle area.   Family History:  Family History  Problem Relation Age of Onset  . Arthritis Mother   . Heart attack Father   . Diabetes Father   . Breast cancer Neg Hx    Social History:  Social  History   Social History  . Marital status: Widowed    Spouse name: N/A  . Number of children: N/A  . Years of education: N/A   Social History Main Topics  . Smoking status: Current Every Day Smoker    Packs/day: 1.00    Years: 35.00    Types: Cigarettes    Start date: 09/27/1979  . Smokeless tobacco: Never Used  . Alcohol use No  . Drug use: No  . Sexual activity: Not Currently   Other Topics Concern  . None   Social History Narrative  . None   Additional History:     Assessment:   Musculoskeletal: Strength & Muscle Tone: within normal limits Gait & Station: normal, Patient does take small steps Patient leans: N/A  Psychiatric Specialty Exam: HPI  Review of Systems  Skin: Positive for itching.  Psychiatric/Behavioral: Negative for depression, hallucinations, memory loss, substance abuse and suicidal ideas. The patient is not nervous/anxious and does not have insomnia (States she sleeps about 7 hours with the Vistaril. Hypersomnia in the daytime as noted above.).   All other systems reviewed and are negative.   Blood pressure 103/61, pulse 83, temperature 98.4 F (36.9 C), temperature source Oral, height 5' (1.524 m), weight 135 lb 3.2 oz (61.3 kg).Body mass index is 26.4 kg/m.  General Appearance: Neat and Well Groomed  Eye Contact:  Good  Speech:  Normal Rate  Volume:  Normal  Mood:  Good  Affect:  Congruent  Thought Process:  Linear and Logical  Orientation:  Full (Time, Place, and Person)  Thought Content:  Negative  Suicidal Thoughts:  No  Homicidal Thoughts:  No  Memory:  Immediate;   Good Recent;   Good Remote;   Good  Judgement:  Good  Insight:  Good  Psychomotor Activity:  Negative  Concentration:  Good  Recall:  Good  Fund of Knowledge: Good  Language: Good  Akathisia:  Negative  Handed:  Right unknown   AIMS (if indicated):  Done on 01/14/15 normal  Assets:  Communication Skills Desire for Improvement Social Support  ADL's:  Intact   Cognition: WNL  Sleep:  good   Is the patient at risk to self?  No. Has the patient been a risk to self in the past 6 months?  No. Has the patient been a risk to self within the distant past?  No. Is the patient a risk to others?  No. Has the patient been a risk to others in the past 6 months?  No. Has the patient been a risk to others within the distant past?  No.  Current Medications: Current Outpatient Prescriptions  Medication Sig Dispense Refill  . ARIPiprazole (ABILIFY) 5 MG tablet Take 1 tablet (5 mg total) by mouth daily. 30 tablet 1  . Calcium Carbonate-Vitamin D 600-400 MG-UNIT per tablet Take by mouth.    . hydrOXYzine (ATARAX/VISTARIL) 25 MG tablet Take 1 capsule 4 times a day as needed for anxiety/itching. Take 2 capsules at bedtime as needed for sleep. 180 tablet 4   No current facility-administered medications for this visit.     Medical Decision Making:  Established Problem, Stable/Improving (1)  Treatment Plan Summary:Medication management and Plan   Continue Abilify 5 mg daily as she is currently stable   Insomnia- Change hydroxyzine 25 mg by mouth 3 times a day. I will also add trazodone 25-50 mg by mouth daily at bedtime when necessary and she agreed with the plan.   Patient will follow up in 2 months.    More than 50% of the time spent in psychoeducation, counseling and coordination of care.    This note was generated in part or whole with voice recognition software. Voice regonition is usually quite accurate but there are transcription errors that can and very often do occur. I apologize for any typographical errors that were not detected and corrected.   Brandy Hale, MD  09/18/2015, 10:31 AM

## 2015-11-20 ENCOUNTER — Encounter: Payer: Self-pay | Admitting: Psychiatry

## 2015-11-20 ENCOUNTER — Ambulatory Visit (INDEPENDENT_AMBULATORY_CARE_PROVIDER_SITE_OTHER): Payer: 59 | Admitting: Psychiatry

## 2015-11-20 VITALS — BP 105/69 | HR 60 | Temp 98.6°F | Wt 139.4 lb

## 2015-11-20 DIAGNOSIS — F3131 Bipolar disorder, current episode depressed, mild: Secondary | ICD-10-CM | POA: Diagnosis not present

## 2015-11-20 MED ORDER — TRAZODONE HCL 50 MG PO TABS: 50.0000 mg | ORAL_TABLET | Freq: Every day | ORAL | 1 refills | Status: DC

## 2015-11-20 MED ORDER — ARIPIPRAZOLE 5 MG PO TABS: 5.0000 mg | ORAL_TABLET | Freq: Every day | ORAL | 1 refills | Status: DC

## 2015-11-20 MED ORDER — HYDROXYZINE HCL 10 MG PO TABS: ORAL_TABLET | ORAL | 1 refills | Status: DC

## 2015-11-20 NOTE — Progress Notes (Signed)
BH MD/PA/NP OP Progress Note  11/20/2015 10:30 AM Adriana Vasquez  MRN:  [id number]  Subjective:  Patient is a 69 year old female with history of bipolar disorder who presented for the follow-up appointment.  She appeared well-groomed as usual.Pt  reported that she was planting pansies since yesterday and her knees hurting today. She was discussing in detail about the winter flowers. She appears well dressed and reported that she takes care of herself. Patient stated that she goes out for lunch once a week with her friend. She stated that she went to the beach with her brother and sister-in-law. She reported that she watches [employer] channel and also watch different shows on the television. She listens to the music and goes to the gym at least twice a week. She has been taking care of herself and has been well-groomed. She reported that her brother has also noticed a difference since the dose of her Abilify has been decreased. She was taking Vistaril due to the itching in her head but she is willing to decrease the dose at this time. She sleeps well at night. She reported that she cleans her house every Monday. She does not wear makeup that day. She appears calm and alert during the interview.    She denied having any suicidal homicidal ideations or plans.     Chief Complaint:  Chief Complaint    Follow-up; Medication Refill     Visit Diagnosis:     ICD-9-CM ICD-10-CM   1. Bipolar affective disorder, currently depressed, mild (HCC) 296.51 F31.31     Past Medical History:  Past Medical History:  Diagnosis Date  . Bipolar disorder (HCC)   . Personal history of tobacco use, presenting hazards to health 01/22/2015    Past Surgical History:  Procedure Laterality Date  . ABDOMINAL HYSTERECTOMY    . HAND SURGERY Right 2001   Patient not sure of what exactly was done.  Repared index finger knuckle area.   Family History:  Family History  Problem Relation Age of Onset  . Arthritis Mother   .  Heart attack Father   . Diabetes Father   . Breast cancer Neg Hx    Social History:  Social History   Social History  . Marital status: Widowed    Spouse name: N/A  . Number of children: N/A  . Years of education: N/A   Social History Main Topics  . Smoking status: Current Every Day Smoker    Packs/day: 1.00    Years: 35.00    Types: Cigarettes    Start date: 09/27/1979  . Smokeless tobacco: Never Used  . Alcohol use No  . Drug use: No  . Sexual activity: Not Currently   Other Topics Concern  . None   Social History Narrative  . None   Additional History:     Assessment:   Musculoskeletal: Strength & Muscle Tone: within normal limits Gait & Station: normal, Patient does take small steps Patient leans: N/A  Psychiatric Specialty Exam: Medication Refill     Review of Systems  Skin: Positive for itching.  Psychiatric/Behavioral: Negative for depression, hallucinations, memory loss, substance abuse and suicidal ideas. The patient is not nervous/anxious and does not have insomnia (States she sleeps about 7 hours with the Vistaril. Hypersomnia in the daytime as noted above.).   All other systems reviewed and are negative.   Blood pressure 105/69, pulse 60, temperature 98.6 F (37 C), temperature source Oral, weight 139 lb 6.4 oz (63.2 kg).Body mass index  is 27.22 kg/m.  General Appearance: Neat and Well Groomed  Eye Contact:  Good  Speech:  Normal Rate  Volume:  Normal  Mood:  Good  Affect:  Congruent  Thought Process:  Linear and Logical  Orientation:  Full (Time, Place, and Person)  Thought Content:  Negative  Suicidal Thoughts:  No  Homicidal Thoughts:  No  Memory:  Immediate;   Good Recent;   Good Remote;   Good  Judgement:  Good  Insight:  Good  Psychomotor Activity:  Negative  Concentration:  Good  Recall:  Good  Fund of Knowledge: Good  Language: Good  Akathisia:  Negative  Handed:  Right unknown   AIMS (if indicated):  Done on 01/14/15  normal  Assets:  Communication Skills Desire for Improvement Social Support  ADL's:  Intact  Cognition: WNL  Sleep:  good   Is the patient at risk to self?  No. Has the patient been a risk to self in the past 6 months?  No. Has the patient been a risk to self within the distant past?  No. Is the patient a risk to others?  No. Has the patient been a risk to others in the past 6 months?  No. Has the patient been a risk to others within the distant past?  No.  Current Medications: Current Outpatient Prescriptions  Medication Sig Dispense Refill  . ARIPiprazole (ABILIFY) 5 MG tablet Take 1 tablet (5 mg total) by mouth daily. 30 tablet 1  . Calcium Carbonate-Vitamin D 600-400 MG-UNIT per tablet Take by mouth.    . hydrOXYzine (ATARAX/VISTARIL) 10 MG tablet TID 90 tablet 1  . traZODone (DESYREL) 50 MG tablet Take 1 tablet (50 mg total) by mouth at bedtime. 30 tablet 1   No current facility-administered medications for this visit.     Medical Decision Making:  Established Problem, Stable/Improving (1)  Treatment Plan Summary:Medication management and Plan   Continue Abilify 5 mg daily as she is currently stable    Change hydroxyzine 10 mg by mouth 3 times a day. Continue  trazodone 25-50 mg by mouth daily at bedtime when necessary and she agreed with the plan.   Patient will follow up in 2 months.    More than 50% of the time spent in psychoeducation, counseling and coordination of care.    This note was generated in part or whole with voice recognition software. Voice regonition is usually quite accurate but there are transcription errors that can and very often do occur. I apologize for any typographical errors that were not detected and corrected.   Brandy HaleUzma Hale Chalfin, MD  11/20/2015, 10:30 AM

## 2016-01-01 DIAGNOSIS — R635 Abnormal weight gain: Secondary | ICD-10-CM | POA: Insufficient documentation

## 2016-01-02 ENCOUNTER — Other Ambulatory Visit: Payer: Self-pay | Admitting: Infectious Diseases

## 2016-01-02 DIAGNOSIS — Z1231 Encounter for screening mammogram for malignant neoplasm of breast: Secondary | ICD-10-CM

## 2016-01-15 ENCOUNTER — Ambulatory Visit (INDEPENDENT_AMBULATORY_CARE_PROVIDER_SITE_OTHER): Payer: 59 | Admitting: Psychiatry

## 2016-01-15 DIAGNOSIS — F3131 Bipolar disorder, current episode depressed, mild: Secondary | ICD-10-CM | POA: Diagnosis not present

## 2016-01-15 MED ORDER — HYDROXYZINE HCL 10 MG PO TABS: 10.0000 mg | ORAL_TABLET | Freq: Four times a day (QID) | ORAL | 2 refills | Status: DC | PRN

## 2016-01-15 MED ORDER — ARIPIPRAZOLE 5 MG PO TABS: 5.0000 mg | ORAL_TABLET | Freq: Every day | ORAL | 1 refills | Status: DC

## 2016-01-15 MED ORDER — TRAZODONE HCL 50 MG PO TABS: 50.0000 mg | ORAL_TABLET | Freq: Every day | ORAL | 2 refills | Status: DC

## 2016-01-15 MED ORDER — BUSPIRONE HCL 5 MG PO TABS: 5.0000 mg | ORAL_TABLET | Freq: Two times a day (BID) | ORAL | 1 refills | Status: DC

## 2016-01-15 NOTE — Progress Notes (Signed)
BH MD/PA/NP OP Progress Note  01/15/2016 12:16 PM Adriana Vasquez  MRN:  [id number]  Subjective:  Patient is a 70 year old female with history of bipolar disorder who presented for the follow-up appointment.  She appeared well-groomed as usual.Pt  reported that she is planning to go to her brother's house for the Thanksgiving in [place] see them. She appeared anxious and apprehensive as she has received a letter from her insurance company about not covering her hydroxyzine in 2018. Patient reported that she wants to have her medications adjusted. We discussed about starting her on BuSpar as it'll be covered next year. Patient reported that she is willing to try the medication. We discussed about gradually decreasing the dose off hydroxyzine as she has been taking it for many years for her itching in the scalp. She agreed with the plan. We have already decrease the dose to 10 mg 3 times a day. Patient is doing well on the medication. She is planning to bake higher for her family members. She appeared well-groomed as usual. She denied having any side effects of her medications at this time. . She reported that she cleans her house every Monday. She does not wear makeup that day. She appears calm and alert during the interview.    She denied having any suicidal homicidal ideations or plans.     Chief Complaint:   Visit Diagnosis:     ICD-9-CM ICD-10-CM   1. Bipolar affective disorder, currently depressed, mild (HCC) 296.51 F31.31     Past Medical History:  Past Medical History:  Diagnosis Date  . Bipolar disorder (HCC)   . Personal history of tobacco use, presenting hazards to health 01/22/2015    Past Surgical History:  Procedure Laterality Date  . ABDOMINAL HYSTERECTOMY    . HAND SURGERY Right 2001   Patient not sure of what exactly was done.  Repared index finger knuckle area.   Family History:  Family History  Problem Relation Age of Onset  . Arthritis Mother   . Heart attack  Father   . Diabetes Father   . Breast cancer Neg Hx    Social History:  Social History   Social History  . Marital status: Widowed    Spouse name: N/A  . Number of children: N/A  . Years of education: N/A   Social History Main Topics  . Smoking status: Current Every Day Smoker    Packs/day: 1.00    Years: 35.00    Types: Cigarettes    Start date: 09/27/1979  . Smokeless tobacco: Never Used  . Alcohol use No  . Drug use: No  . Sexual activity: Not Currently   Other Topics Concern  . Not on file   Social History Narrative  . No narrative on file   Additional History:     Assessment:   Musculoskeletal: Strength & Muscle Tone: within normal limits Gait & Station: normal, Patient does take small steps Patient leans: N/A  Psychiatric Specialty Exam: Medication Refill     Review of Systems  Skin: Positive for itching.  Psychiatric/Behavioral: Negative for depression, hallucinations, memory loss, substance abuse and suicidal ideas. The patient is not nervous/anxious and does not have insomnia (States she sleeps about 7 hours with the Vistaril. Hypersomnia in the daytime as noted above.).   All other systems reviewed and are negative.   There were no vitals taken for this visit.There is no height or weight on file to calculate BMI.  General Appearance: Neat and Well Groomed  Eye Contact:  Good  Speech:  Normal Rate  Volume:  Normal  Mood:  Good  Affect:  Congruent  Thought Process:  Linear and Logical  Orientation:  Full (Time, Place, and Person)  Thought Content:  Negative  Suicidal Thoughts:  No  Homicidal Thoughts:  No  Memory:  Immediate;   Good Recent;   Good Remote;   Good  Judgement:  Good  Insight:  Good  Psychomotor Activity:  Negative  Concentration:  Good  Recall:  Good  Fund of Knowledge: Good  Language: Good  Akathisia:  Negative  Handed:  Right unknown   AIMS (if indicated):  Done on 01/14/15 normal  Assets:  Communication Skills Desire  for Improvement Social Support  ADL's:  Intact  Cognition: WNL  Sleep:  good   Is the patient at risk to self?  No. Has the patient been a risk to self in the past 6 months?  No. Has the patient been a risk to self within the distant past?  No. Is the patient a risk to others?  No. Has the patient been a risk to others in the past 6 months?  No. Has the patient been a risk to others within the distant past?  No.  Current Medications: Current Outpatient Prescriptions  Medication Sig Dispense Refill  . ARIPiprazole (ABILIFY) 5 MG tablet Take 1 tablet (5 mg total) by mouth daily. 30 tablet 1  . busPIRone (BUSPAR) 5 MG tablet Take 1 tablet (5 mg total) by mouth 2 (two) times daily. 60 tablet 1  . Calcium Carbonate-Vitamin D 600-400 MG-UNIT per tablet Take by mouth.    . hydrOXYzine (ATARAX/VISTARIL) 10 MG tablet Take 1 tablet (10 mg total) by mouth 4 (four) times daily as needed. 120 tablet 2  . traZODone (DESYREL) 50 MG tablet Take 1 tablet (50 mg total) by mouth at bedtime. 30 tablet 2   No current facility-administered medications for this visit.     Medical Decision Making:  Established Problem, Stable/Improving (1)  Treatment Plan Summary:Medication management and Plan   Continue Abilify 5 mg daily    Change hydroxyzine 10 mg by mouth Times daily and she will get a prescription of 120 pills. Patient will start decreasing the dose to twice a day and will gradually taper off the next few months. She will refill her medication next month as well. I will start her on BuSpar 5 mg twice a day. Advised her to start taking 1 pill and she agreed with the plan. Continue  trazodone 50 mg by mouth daily at bedtime when necessary and she agreed with the plan.   Patient will follow up in 2 months.    More than 50% of the time spent in psychoeducation, counseling and coordination of care.    This note was generated in part or whole with voice recognition software. Voice regonition is  usually quite accurate but there are transcription errors that can and very often do occur. I apologize for any typographical errors that were not detected and corrected.   Brandy HaleUzma Rece Zechman, MD  01/15/2016, 12:16 PM

## 2016-01-23 ENCOUNTER — Telehealth: Payer: Self-pay | Admitting: *Deleted

## 2016-01-23 NOTE — Telephone Encounter (Signed)
Notified patient that annual lung cancer screening low dose CT scan is due. Confirmed that patient is within the age range of 55-77, and asymptomatic, (no signs or symptoms of lung cancer). Patient denies illness that would prevent curative treatment for lung cancer if found. The patient is a current smoker, with a 36 pack year history. The shared decision making visit was done 01/25/15. Patient is agreeable for CT scan being scheduled. Information sent to business office.

## 2016-02-07 ENCOUNTER — Ambulatory Visit
Admission: RE | Admit: 2016-02-07 | Discharge: 2016-02-07 | Disposition: A | Discharge status: Home / Self Care | Payer: Medicare Other | Source: Ambulatory Visit | Attending: Infectious Diseases | Admitting: Infectious Diseases

## 2016-02-07 DIAGNOSIS — Z1231 Encounter for screening mammogram for malignant neoplasm of breast: Secondary | ICD-10-CM | POA: Insufficient documentation

## 2016-02-20 ENCOUNTER — Other Ambulatory Visit: Payer: Self-pay | Admitting: *Deleted

## 2016-02-20 DIAGNOSIS — Z87891 Personal history of nicotine dependence: Secondary | ICD-10-CM

## 2016-03-06 ENCOUNTER — Ambulatory Visit
Admission: RE | Admit: 2016-03-06 | Discharge: 2016-03-06 | Disposition: A | Discharge status: Home / Self Care | Payer: Medicare Other | Source: Ambulatory Visit | Attending: Oncology | Admitting: Oncology

## 2016-03-06 DIAGNOSIS — I2510 Atherosclerotic heart disease of native coronary artery without angina pectoris: Secondary | ICD-10-CM | POA: Insufficient documentation

## 2016-03-06 DIAGNOSIS — R918 Other nonspecific abnormal finding of lung field: Secondary | ICD-10-CM | POA: Diagnosis not present

## 2016-03-06 DIAGNOSIS — Z87891 Personal history of nicotine dependence: Secondary | ICD-10-CM | POA: Insufficient documentation

## 2016-03-06 DIAGNOSIS — I700 Atherosclerosis of aorta: Secondary | ICD-10-CM | POA: Insufficient documentation

## 2016-03-11 ENCOUNTER — Ambulatory Visit: Payer: 59 | Admitting: Psychiatry

## 2016-03-16 ENCOUNTER — Telehealth: Payer: Self-pay | Admitting: *Deleted

## 2016-03-16 NOTE — Telephone Encounter (Signed)
Voicemail left in attempt to notify patient of LDCT lung cancer screening results with recommendation for 6 month follow up imaging. Also, upon return call patient will be notified of incidental finding noted below. This note will be forwarded to PCP via Epic.   IMPRESSION: 1. Lung-Rads category 3, probably benign findings. Short-term follow-up in 6 months is recommended with repeat low-dose chest CT without contrast (please use the following order, "CT CHEST LCS NODULE FOLLOW-UP W/O CM"). 2. Thoracic aortic and coronary artery atherosclerosis.

## 2016-03-26 ENCOUNTER — Other Ambulatory Visit: Payer: Self-pay

## 2016-03-26 NOTE — Telephone Encounter (Signed)
pt called she needs a refill on her medications pt has this month.

## 2016-03-27 MED ORDER — HYDROXYZINE HCL 10 MG PO TABS: 10.0000 mg | ORAL_TABLET | Freq: Four times a day (QID) | ORAL | 0 refills | Status: DC | PRN

## 2016-03-27 MED ORDER — TRAZODONE HCL 50 MG PO TABS: 50.0000 mg | ORAL_TABLET | Freq: Every day | ORAL | 0 refills | Status: DC

## 2016-03-27 MED ORDER — BUSPIRONE HCL 5 MG PO TABS: 5.0000 mg | ORAL_TABLET | Freq: Two times a day (BID) | ORAL | 0 refills | Status: DC

## 2016-03-27 MED ORDER — ARIPIPRAZOLE 5 MG PO TABS: 5.0000 mg | ORAL_TABLET | Freq: Every day | ORAL | 0 refills | Status: DC

## 2016-03-30 ENCOUNTER — Telehealth: Payer: Self-pay

## 2016-03-30 NOTE — Telephone Encounter (Signed)
received denial for hydroxyz reason:  anxiety not list in office notes.  and pt will need to try and fall buspirone, remeron, zolfot

## 2016-03-30 NOTE — Telephone Encounter (Signed)
did prior auth online at [email or web address]. pending responces.

## 2016-03-30 NOTE — Telephone Encounter (Signed)
I know it was denied because it was not for anxiety.

## 2016-03-30 NOTE — Telephone Encounter (Signed)
She takes it for itching- not anxiety.

## 2016-03-30 NOTE — Telephone Encounter (Signed)
received a fax stating that prior auth was needed for hydroxyzine hcl. 10mg 

## 2016-04-01 NOTE — Telephone Encounter (Signed)
left a message telling patient that she can try zyrtec since her insurance would not approve hydroxyzine.

## 2016-04-01 NOTE — Telephone Encounter (Signed)
Called pharmacy and spoke with pharmacist- her insurance changed and she will not be able to get hydroxyzine any longer. She can try Zyrtec for itching. Please discuss with her.

## 2016-04-08 ENCOUNTER — Ambulatory Visit (INDEPENDENT_AMBULATORY_CARE_PROVIDER_SITE_OTHER): Payer: 59 | Admitting: Psychiatry

## 2016-04-08 ENCOUNTER — Encounter: Payer: Self-pay | Admitting: Psychiatry

## 2016-04-08 VITALS — BP 107/67 | HR 80 | Temp 98.6°F | Wt 140.8 lb

## 2016-04-08 DIAGNOSIS — F3131 Bipolar disorder, current episode depressed, mild: Secondary | ICD-10-CM

## 2016-04-08 DIAGNOSIS — Z87898 Personal history of other specified conditions: Secondary | ICD-10-CM | POA: Diagnosis not present

## 2016-04-08 MED ORDER — ARIPIPRAZOLE 5 MG PO TABS: 5.0000 mg | ORAL_TABLET | Freq: Every day | ORAL | 2 refills | Status: DC

## 2016-04-08 MED ORDER — BUSPIRONE HCL 5 MG PO TABS: 5.0000 mg | ORAL_TABLET | Freq: Three times a day (TID) | ORAL | 2 refills | Status: DC

## 2016-04-08 MED ORDER — TRAZODONE HCL 50 MG PO TABS: 50.0000 mg | ORAL_TABLET | Freq: Every day | ORAL | 2 refills | Status: DC

## 2016-04-08 NOTE — Progress Notes (Signed)
BH MD/PA/NP OP Progress Note  04/08/2016 3:56 PM Adriana FinlayShirley C Vasquez  MRN:  [id number]  Subjective:  Patient is a 71 year old female with history of bipolar disorder who presented for the follow-up appointment.  She appeared well-groomed as usual.Pt  reported that she has decorated her house for the Valentine's Day. She reported that she is excited and showed her house to her friends. Patient reported that she is planning to go out for lunch with her friends as they go for lunch every Thursday. Patient reported that she has been taking the hydroxyzine for itching but the insurance company is not approving. She is willing to try other over-the-counter medications. We discussed about several medications in detail. She is receptive to the change. Patient currently denied having any suicidal homicidal ideations or plans. She appeared calm and alert. She reported that she sleeps well with the help of the trazodone. She denied having any perceptual disturbances at this time.  Patient reported that the current combination of medications helping her. She does not have any mood  symptoms anger anxiety or paranoia.    She denied having any suicidal homicidal ideations or plans.     Chief Complaint:  Chief Complaint    Follow-up; Medication Refill     Visit Diagnosis:     ICD-9-CM ICD-10-CM   1. Bipolar affective disorder, currently depressed, mild (HCC) 296.51 F31.31   2. History of itching V13.3 Z87.898     Past Medical History:  Past Medical History:  Diagnosis Date  . Bipolar disorder (HCC)   . Personal history of tobacco use, presenting hazards to health 01/22/2015    Past Surgical History:  Procedure Laterality Date  . ABDOMINAL HYSTERECTOMY    . HAND SURGERY Right 2001   Patient not sure of what exactly was done.  Repared index finger knuckle area.   Family History:  Family History  Problem Relation Age of Onset  . Arthritis Mother   . Heart attack Father   . Diabetes Father   .  Breast cancer Neg Hx    Social History:  Social History   Social History  . Marital status: Widowed    Spouse name: N/A  . Number of children: N/A  . Years of education: N/A   Social History Main Topics  . Smoking status: Current Every Day Smoker    Packs/day: 1.00    Years: 35.00    Types: Cigarettes    Start date: 09/27/1979  . Smokeless tobacco: Never Used  . Alcohol use No  . Drug use: No  . Sexual activity: Not Currently   Other Topics Concern  . None   Social History Narrative  . None   Additional History:     Assessment:   Musculoskeletal: Strength & Muscle Tone: within normal limits Gait & Station: normal, Patient does take small steps Patient leans: N/A  Psychiatric Specialty Exam: Medication Refill     Review of Systems  Skin: Positive for itching.  Psychiatric/Behavioral: Negative for depression, hallucinations, memory loss, substance abuse and suicidal ideas. The patient is not nervous/anxious and does not have insomnia (States she sleeps about 7 hours with the Vistaril. Hypersomnia in the daytime as noted above.).   All other systems reviewed and are negative.   Blood pressure 107/67, pulse 80, temperature 98.6 F (37 C), temperature source Oral, weight 140 lb 12.8 oz (63.9 kg).Body mass index is 27.5 kg/m.  General Appearance: Neat and Well Groomed  Eye Contact:  Good  Speech:  Normal Rate  Volume:  Normal  Mood:  Good  Affect:  Congruent  Thought Process:  Linear and Logical  Orientation:  Full (Time, Place, and Person)  Thought Content:  Negative  Suicidal Thoughts:  No  Homicidal Thoughts:  No  Memory:  Immediate;   Good Recent;   Good Remote;   Good  Judgement:  Good  Insight:  Good  Psychomotor Activity:  Negative  Concentration:  Good  Recall:  Good  Fund of Knowledge: Good  Language: Good  Akathisia:  Negative  Handed:  Right unknown   AIMS (if indicated):  Done on 01/14/15 normal  Assets:  Communication Skills Desire for  Improvement Social Support  ADL's:  Intact  Cognition: WNL  Sleep:  good   Is the patient at risk to self?  No. Has the patient been a risk to self in the past 6 months?  No. Has the patient been a risk to self within the distant past?  No. Is the patient a risk to others?  No. Has the patient been a risk to others in the past 6 months?  No. Has the patient been a risk to others within the distant past?  No.  Current Medications: Current Outpatient Prescriptions  Medication Sig Dispense Refill  . ARIPiprazole (ABILIFY) 5 MG tablet Take 1 tablet (5 mg total) by mouth daily. 30 tablet 2  . busPIRone (BUSPAR) 5 MG tablet Take 1 tablet (5 mg total) by mouth 3 (three) times daily. 90 tablet 2  . Calcium Carbonate-Vitamin D 600-400 MG-UNIT per tablet Take by mouth.    . traZODone (DESYREL) 50 MG tablet Take 1 tablet (50 mg total) by mouth at bedtime. 30 tablet 2   No current facility-administered medications for this visit.     Medical Decision Making:  Established Problem, Stable/Improving (1)  Treatment Plan Summary:Medication management and Plan   Continue Abilify 5 mg daily  D/c Hydroxyzine I will start her on BuSpar 5 mg TID. Advised her to start taking over-the-counter medications to help with the itching and she was provided information about medications and she agreed with the plan. She was asked her to take Benadryl when necessary and she agreed with the plan..  Continue  trazodone 50 mg by mouth daily at bedtime when necessary and she agreed with the plan.   Patient will follow up in 2 months.    More than 50% of the time spent in psychoeducation, counseling and coordination of care.    This note was generated in part or whole with voice recognition software. Voice regonition is usually quite accurate but there are transcription errors that can and very often do occur. I apologize for any typographical errors that were not detected and corrected.   Brandy Hale,  MD  04/08/2016, 3:56 PM

## 2016-06-17 ENCOUNTER — Ambulatory Visit (INDEPENDENT_AMBULATORY_CARE_PROVIDER_SITE_OTHER): Payer: 59 | Admitting: Psychiatry

## 2016-06-17 ENCOUNTER — Encounter: Payer: Self-pay | Admitting: Psychiatry

## 2016-06-17 VITALS — BP 120/76 | HR 91 | Temp 98.6°F | Wt 142.6 lb

## 2016-06-17 DIAGNOSIS — F3131 Bipolar disorder, current episode depressed, mild: Secondary | ICD-10-CM

## 2016-06-17 MED ORDER — BUSPIRONE HCL 5 MG PO TABS: 5.0000 mg | ORAL_TABLET | Freq: Three times a day (TID) | ORAL | 2 refills | Status: DC

## 2016-06-17 MED ORDER — ARIPIPRAZOLE 5 MG PO TABS: 5.0000 mg | ORAL_TABLET | Freq: Every day | ORAL | 2 refills | Status: DC

## 2016-06-17 MED ORDER — TRAZODONE HCL 50 MG PO TABS: 50.0000 mg | ORAL_TABLET | Freq: Every day | ORAL | 2 refills | Status: DC

## 2016-06-17 NOTE — Progress Notes (Signed)
BH MD/PA/NP OP Progress Note  06/17/2016 9:46 AM Adriana Vasquez  MRN:  [id number]  Subjective:  Patient is a 71 year old female with history of bipolar disorder who presented for the follow-up appointment.  She appeared well-groomed as usual.Pt  reported that she continues to have itching in her head as she is unable to get the Vistaril due to her insurance. Patient reported that she has seeing the dermatologist twice in the past and they could not find any reason for the same. Patient reported that she has been washing her hair on alternate days. We discussed about using coconut oil massage before shampoo and she agreed with the plan. Patient reported that she feels apprehensive and anxious due to her itching in the scalp and has been taking BuSpar but she does not feel that it is very effective. She is also on Abilify on a regular basis. She is planning to meet with her friends tomorrow. She denied having any suicidal ideations or plans. She appeared very well-groomed as usual.   He also discussed about her sister who is currently in a nursing home and she sees her on a regular basis as her sister has paraplegia since she was 51 years old. She was married but now her husband passed away 5 years ago. She sees her on a regular basis.   She denied having any suicidal homicidal ideations or plans.     Chief Complaint:  Chief Complaint    Follow-up; Medication Refill     Visit Diagnosis:     ICD-9-CM ICD-10-CM   1. Bipolar affective disorder, currently depressed, mild (HCC) 296.51 F31.31     Past Medical History:  Past Medical History:  Diagnosis Date  . Bipolar disorder (HCC)   . Personal history of tobacco use, presenting hazards to health 01/22/2015    Past Surgical History:  Procedure Laterality Date  . ABDOMINAL HYSTERECTOMY    . HAND SURGERY Right 2001   Patient not sure of what exactly was done.  Repared index finger knuckle area.   Family History:  Family History  Problem  Relation Age of Onset  . Arthritis Mother   . Heart attack Father   . Diabetes Father   . Breast cancer Neg Hx    Social History:  Social History   Social History  . Marital status: Widowed    Spouse name: N/A  . Number of children: N/A  . Years of education: N/A   Social History Main Topics  . Smoking status: Current Every Day Smoker    Packs/day: 1.00    Years: 35.00    Types: Cigarettes    Start date: 09/27/1979  . Smokeless tobacco: Never Used  . Alcohol use No  . Drug use: No  . Sexual activity: Not Currently   Other Topics Concern  . None   Social History Narrative  . None   Additional History:     Assessment:   Musculoskeletal: Strength & Muscle Tone: within normal limits Gait & Station: normal, Patient does take small steps Patient leans: N/A  Psychiatric Specialty Exam: Medication Refill     Review of Systems  Skin: Positive for itching.  Psychiatric/Behavioral: Negative for depression, hallucinations, memory loss, substance abuse and suicidal ideas. The patient is not nervous/anxious and does not have insomnia (States she sleeps about 7 hours with the Vistaril. Hypersomnia in the daytime as noted above.).   All other systems reviewed and are negative.   Blood pressure 120/76, pulse 91, temperature 98.6 F (37  C), temperature source Oral, weight 142 lb 9.6 oz (64.7 kg).Body mass index is 27.85 kg/m.  General Appearance: Neat and Well Groomed  Eye Contact:  Good  Speech:  Normal Rate  Volume:  Normal  Mood:  Good  Affect:  Congruent  Thought Process:  Linear and Logical  Orientation:  Full (Time, Place, and Person)  Thought Content:  Negative  Suicidal Thoughts:  No  Homicidal Thoughts:  No  Memory:  Immediate;   Good Recent;   Good Remote;   Good  Judgement:  Good  Insight:  Good  Psychomotor Activity:  Negative  Concentration:  Good  Recall:  Good  Fund of Knowledge: Good  Language: Good  Akathisia:  Negative  Handed:  Right unknown    AIMS (if indicated):  Done on 01/14/15 normal  Assets:  Communication Skills Desire for Improvement Social Support  ADL's:  Intact  Cognition: WNL  Sleep:  good   Is the patient at risk to self?  No. Has the patient been a risk to self in the past 6 months?  No. Has the patient been a risk to self within the distant past?  No. Is the patient a risk to others?  No. Has the patient been a risk to others in the past 6 months?  No. Has the patient been a risk to others within the distant past?  No.  Current Medications: Current Outpatient Prescriptions  Medication Sig Dispense Refill  . ARIPiprazole (ABILIFY) 5 MG tablet Take 1 tablet (5 mg total) by mouth daily. 30 tablet 2  . busPIRone (BUSPAR) 5 MG tablet Take 1 tablet (5 mg total) by mouth 3 (three) times daily. 90 tablet 2  . Calcium Carbonate-Vitamin D 600-400 MG-UNIT per tablet Take by mouth.    . traZODone (DESYREL) 50 MG tablet Take 1 tablet (50 mg total) by mouth at bedtime. 30 tablet 2   No current facility-administered medications for this visit.     Medical Decision Making:  Established Problem, Stable/Improving (1)  Treatment Plan Summary:Medication management and Plan   Continue Abilify 5 mg daily  Continue BuSpar 5 mg TID. She will try coconut oil massage in her head before shampoo and she agreed with the plan. Continue  trazodone 50 mg by mouth daily at bedtime when necessary and she agreed with the plan.   Patient will follow up in 1 months.    More than 50% of the time spent in psychoeducation, counseling and coordination of care.    This note was generated in part or whole with voice recognition software. Voice regonition is usually quite accurate but there are transcription errors that can and very often do occur. I apologize for any typographical errors that were not detected and corrected.   Brandy Hale, MD  06/17/2016, 9:46 AM

## 2016-07-01 DIAGNOSIS — R9389 Abnormal findings on diagnostic imaging of other specified body structures: Secondary | ICD-10-CM | POA: Insufficient documentation

## 2016-07-03 ENCOUNTER — Telehealth: Payer: Self-pay | Admitting: *Deleted

## 2016-07-03 DIAGNOSIS — R9389 Abnormal findings on diagnostic imaging of other specified body structures: Secondary | ICD-10-CM

## 2016-07-03 NOTE — Telephone Encounter (Signed)
Notified patient that lung cancer screening low dose CT scan is due in July. Confirmed that patient is within the age range of 55-77, and asymptomatic, (no signs or symptoms of lung cancer). Patient denies illness that would prevent curative treatment for lung cancer if found. Verified smoking history, (36 pack year, current smoker). The shared decision making visit was done 01/25/15. Patient is agreeable for CT scan being scheduled.

## 2016-07-15 ENCOUNTER — Encounter: Payer: Self-pay | Admitting: Psychiatry

## 2016-07-15 ENCOUNTER — Ambulatory Visit (INDEPENDENT_AMBULATORY_CARE_PROVIDER_SITE_OTHER): Payer: 59 | Admitting: Psychiatry

## 2016-07-15 VITALS — BP 105/67 | HR 85 | Temp 99.2°F | Wt 139.6 lb

## 2016-07-15 DIAGNOSIS — F3131 Bipolar disorder, current episode depressed, mild: Secondary | ICD-10-CM | POA: Diagnosis not present

## 2016-07-15 MED ORDER — ARIPIPRAZOLE 5 MG PO TABS: 5.0000 mg | ORAL_TABLET | Freq: Every day | ORAL | 2 refills | Status: DC

## 2016-07-15 NOTE — Progress Notes (Signed)
BH MD/PA/NP OP Progress Note  07/15/2016 9:33 AM Adriana Vasquez  MRN:  [id number]  Subjective:  Patient is a 71 year old female with history of bipolar disorder who presented for the follow-up appointment.  She appeared well-groomed as usual.Pt  reported that she has started noticing improvement in her itching after she started using the coconut oil and the scalp shampoo. She reported that her itching is improving. She is also taking her medications as prescribed. She reported that she does not have any acute symptoms at this time. She appeared well groomed. She reported that she has been taking her medications as prescribed. We discussed about decreasing the dose of Abilify and she agreed with the plan. She currently denied having any mood symptoms anger anxiety or paranoia.    She denied having any suicidal homicidal ideations or plans.     Chief Complaint:  Chief Complaint    Follow-up; Medication Refill     Visit Diagnosis:     ICD-9-CM ICD-10-CM   1. Bipolar affective disorder, currently depressed, mild (HCC) 296.51 F31.31     Past Medical History:  Past Medical History:  Diagnosis Date  . Bipolar disorder (HCC)   . Personal history of tobacco use, presenting hazards to health 01/22/2015    Past Surgical History:  Procedure Laterality Date  . ABDOMINAL HYSTERECTOMY    . HAND SURGERY Right 2001   Patient not sure of what exactly was done.  Repared index finger knuckle area.   Family History:  Family History  Problem Relation Age of Onset  . Arthritis Mother   . Heart attack Father   . Diabetes Father   . Breast cancer Neg Hx    Social History:  Social History   Social History  . Marital status: Widowed    Spouse name: N/A  . Number of children: N/A  . Years of education: N/A   Social History Main Topics  . Smoking status: Current Every Day Smoker    Packs/day: 1.00    Years: 35.00    Types: Cigarettes    Start date: 09/27/1979  . Smokeless tobacco: Never Used   . Alcohol use No  . Drug use: No  . Sexual activity: Not Currently   Other Topics Concern  . None   Social History Narrative  . None   Additional History:     Assessment:   Musculoskeletal: Strength & Muscle Tone: within normal limits Gait & Station: normal, Patient does take small steps Patient leans: N/A  Psychiatric Specialty Exam: Medication Refill     Review of Systems  Skin: Positive for itching.  Psychiatric/Behavioral: Negative for depression, hallucinations, memory loss, substance abuse and suicidal ideas. The patient is not nervous/anxious and does not have insomnia (States she sleeps about 7 hours with the Vistaril. Hypersomnia in the daytime as noted above.).   All other systems reviewed and are negative.   Blood pressure 105/67, pulse 85, temperature 99.2 F (37.3 C), temperature source Oral, weight 139 lb 9.6 oz (63.3 kg).Body mass index is 27.26 kg/m.  General Appearance: Neat and Well Groomed  Eye Contact:  Good  Speech:  Normal Rate  Volume:  Normal  Mood:  Good  Affect:  Congruent  Thought Process:  Linear and Logical  Orientation:  Full (Time, Place, and Person)  Thought Content:  Negative  Suicidal Thoughts:  No  Homicidal Thoughts:  No  Memory:  Immediate;   Good Recent;   Good Remote;   Good  Judgement:  Good  Insight:  Good  Psychomotor Activity:  Negative  Concentration:  Good  Recall:  Good  Fund of Knowledge: Good  Language: Good  Akathisia:  Negative  Handed:  Right unknown   AIMS (if indicated):  Done on 01/14/15 normal  Assets:  Communication Skills Desire for Improvement Social Support  ADL's:  Intact  Cognition: WNL  Sleep:  good   Is the patient at risk to self?  No. Has the patient been a risk to self in the past 6 months?  No. Has the patient been a risk to self within the distant past?  No. Is the patient a risk to others?  No. Has the patient been a risk to others in the past 6 months?  No. Has the patient been  a risk to others within the distant past?  No.  Current Medications: Current Outpatient Prescriptions  Medication Sig Dispense Refill  . ARIPiprazole (ABILIFY) 5 MG tablet Take 1 tablet (5 mg total) by mouth daily. 1/2 pill in am - pt has supply 30 tablet 2  . busPIRone (BUSPAR) 5 MG tablet Take 1 tablet (5 mg total) by mouth 3 (three) times daily. 90 tablet 2  . Calcium Carbonate-Vitamin D 600-400 MG-UNIT per tablet Take by mouth.    . traZODone (DESYREL) 50 MG tablet Take 1 tablet (50 mg total) by mouth at bedtime. 30 tablet 2   No current facility-administered medications for this visit.     Medical Decision Making:  Established Problem, Stable/Improving (1)  Treatment Plan Summary:Medication management and Plan   Decrease Abilify 2.5 mg daily. Patient has supply. Continue BuSpar 5 mg TID. She will continue  coconut oil massage in her head before shampoo and she agreed with the plan. Continue  trazodone 50 mg by mouth daily at bedtime when necessary and she agreed with the plan.   Patient will follow up in 2 months.    More than 50% of the time spent in psychoeducation, counseling and coordination of care.    This note was generated in part or whole with voice recognition software. Voice regonition is usually quite accurate but there are transcription errors that can and very often do occur. I apologize for any typographical errors that were not detected and corrected.   Brandy HaleUzma Pasco Marchitto, MD  07/15/2016, 9:33 AM

## 2016-08-17 ENCOUNTER — Telehealth: Payer: Self-pay

## 2016-08-17 ENCOUNTER — Other Ambulatory Visit: Payer: Self-pay | Admitting: Psychiatry

## 2016-08-17 MED ORDER — BUSPIRONE HCL 5 MG PO TABS: 5.0000 mg | ORAL_TABLET | Freq: Three times a day (TID) | ORAL | 2 refills | Status: DC

## 2016-08-17 NOTE — Telephone Encounter (Signed)
done

## 2016-08-17 NOTE — Telephone Encounter (Signed)
Pt was last seen on  07-15-16 next appt 09-14-16. Pt needs enough medication to do until appt.    busPIRone (BUSPAR) 5 MG tablet  Medication  Date: 06/17/2016 Department: [facility] Ordering/Authorizing: Brandy HaleFaheem, Uzma, MD  Order Providers   Prescribing Provider Encounter Provider  Brandy HaleFaheem, Uzma, MD Brandy HaleFaheem, Uzma, MD  Medication Detail    Disp Refills Start End   busPIRone (BUSPAR) 5 MG tablet 90 tablet 2 06/17/2016    Sig - Route: Take 1 tablet (5 mg total) by mouth 3 (three) times daily. - Oral   E-Prescribing Status: Receipt confirmed by pharmacy (06/17/2016 8:55 AM EDT)   Pharmacy   [phone] - [place], [place] - [address]

## 2016-09-09 ENCOUNTER — Ambulatory Visit
Admission: RE | Admit: 2016-09-09 | Discharge: 2016-09-09 | Disposition: A | Discharge status: Home / Self Care | Payer: Medicare Other | Source: Ambulatory Visit | Attending: Oncology | Admitting: Oncology

## 2016-09-09 DIAGNOSIS — Z87891 Personal history of nicotine dependence: Secondary | ICD-10-CM | POA: Diagnosis not present

## 2016-09-09 DIAGNOSIS — R938 Abnormal findings on diagnostic imaging of other specified body structures: Secondary | ICD-10-CM | POA: Diagnosis present

## 2016-09-09 DIAGNOSIS — R9389 Abnormal findings on diagnostic imaging of other specified body structures: Secondary | ICD-10-CM

## 2016-09-09 DIAGNOSIS — R918 Other nonspecific abnormal finding of lung field: Secondary | ICD-10-CM | POA: Diagnosis not present

## 2016-09-09 DIAGNOSIS — I700 Atherosclerosis of aorta: Secondary | ICD-10-CM | POA: Diagnosis not present

## 2016-09-10 ENCOUNTER — Encounter: Payer: Self-pay | Admitting: *Deleted

## 2016-09-14 ENCOUNTER — Encounter: Payer: Self-pay | Admitting: Psychiatry

## 2016-09-14 ENCOUNTER — Ambulatory Visit (INDEPENDENT_AMBULATORY_CARE_PROVIDER_SITE_OTHER): Payer: 59 | Admitting: Psychiatry

## 2016-09-14 VITALS — BP 107/69 | HR 61 | Ht 60.0 in | Wt 135.0 lb

## 2016-09-14 DIAGNOSIS — F411 Generalized anxiety disorder: Secondary | ICD-10-CM | POA: Diagnosis not present

## 2016-09-14 DIAGNOSIS — F3131 Bipolar disorder, current episode depressed, mild: Secondary | ICD-10-CM | POA: Diagnosis not present

## 2016-09-14 MED ORDER — BUSPIRONE HCL 5 MG PO TABS: ORAL_TABLET | ORAL | 2 refills | Status: DC

## 2016-09-14 MED ORDER — ARIPIPRAZOLE 5 MG PO TABS: 5.0000 mg | ORAL_TABLET | Freq: Every day | ORAL | 2 refills | Status: DC

## 2016-09-14 MED ORDER — TRAZODONE HCL 50 MG PO TABS: 50.0000 mg | ORAL_TABLET | Freq: Every day | ORAL | 2 refills | Status: DC

## 2016-09-14 NOTE — Progress Notes (Signed)
BH MD/PA/NP OP Progress Note  09/14/2016 10:22 AM TEZRA MAHR  MRN:  [id number]  Subjective:  Patient is a 71 year old female with history of bipolar disorder who presented for the follow-up appointment.  She appeared well-groomed as usual.Pt  reported that she continues to have itching in her head. She reported that there was a slight improvement after she has started using the  selsun blue shampoo and coconut oil. She reported that she wakes up with the itching. We discussed about increasing the dose of BuSpar. She is compliant with her medications. She reported that she went shopping and watched a movie with her friends last night. She appeared calm and alert during the interview. She reported that she does not have any side effects of the medications. She denied having any suicidal ideations or plans. She reported that her medications are very expensive and it Abilify also cost her $47 /  month.  She is on fixed income and is concerned about the cost of the medications. She is receptive to medication adjustment.     Chief Complaint:  Chief Complaint    Follow-up     Visit Diagnosis:     ICD-10-CM   1. Bipolar affective disorder, currently depressed, mild (HCC) F31.31   2. Anxiety state F41.1     Past Medical History:  Past Medical History:  Diagnosis Date  . Bipolar disorder (HCC)   . Personal history of tobacco use, presenting hazards to health 01/22/2015    Past Surgical History:  Procedure Laterality Date  . ABDOMINAL HYSTERECTOMY    . HAND SURGERY Right 2001   Patient not sure of what exactly was done.  Repared index finger knuckle area.   Family History:  Family History  Problem Relation Age of Onset  . Arthritis Mother   . Heart attack Father   . Diabetes Father   . Breast cancer Neg Hx    Social History:  Social History   Social History  . Marital status: Widowed    Spouse name: N/A  . Number of children: N/A  . Years of education: N/A   Social  History Main Topics  . Smoking status: Current Every Day Smoker    Packs/day: 1.00    Years: 35.00    Types: Cigarettes    Start date: 09/27/1979  . Smokeless tobacco: Never Used     Comment: Has cut back to 1/2 pack  . Alcohol use No  . Drug use: No  . Sexual activity: Not Currently   Other Topics Concern  . None   Social History Narrative  . None   Additional History:     Assessment:   Musculoskeletal: Strength & Muscle Tone: within normal limits Gait & Station: normal, Patient does take small steps Patient leans: N/A  Psychiatric Specialty Exam: Medication Refill     Review of Systems  Skin: Positive for itching.  Psychiatric/Behavioral: Negative for depression, hallucinations, memory loss, substance abuse and suicidal ideas. The patient is not nervous/anxious and does not have insomnia (States she sleeps about 7 hours with the Vistaril. Hypersomnia in the daytime as noted above.).   All other systems reviewed and are negative.   Blood pressure 107/69, pulse 61, height 5' (1.524 m), weight 135 lb (61.2 kg).Body mass index is 26.37 kg/m.  General Appearance: Neat and Well Groomed  Eye Contact:  Good  Speech:  Normal Rate  Volume:  Normal  Mood:  Good  Affect:  Congruent  Thought Process:  Linear and Logical  Orientation:  Full (Time, Place, and Person)  Thought Content:  Negative  Suicidal Thoughts:  No  Homicidal Thoughts:  No  Memory:  Immediate;   Good Recent;   Good Remote;   Good  Judgement:  Good  Insight:  Good  Psychomotor Activity:  Negative  Concentration:  Good  Recall:  Good  Fund of Knowledge: Good  Language: Good  Akathisia:  Negative  Handed:  Right unknown   AIMS (if indicated):  Done on 01/14/15 normal  Assets:  Communication Skills Desire for Improvement Social Support  ADL's:  Intact  Cognition: WNL  Sleep:  good   Is the patient at risk to self?  No. Has the patient been a risk to self in the past 6 months?  No. Has the  patient been a risk to self within the distant past?  No. Is the patient a risk to others?  No. Has the patient been a risk to others in the past 6 months?  No. Has the patient been a risk to others within the distant past?  No.  Current Medications: Current Outpatient Prescriptions  Medication Sig Dispense Refill  . ARIPiprazole (ABILIFY) 5 MG tablet Take 1 tablet (5 mg total) by mouth daily. 30 tablet 2  . busPIRone (BUSPAR) 5 MG tablet 10 mg in am, 5 mg at noon and bedtime. 120 tablet 2  . Calcium Carbonate-Vitamin D 600-400 MG-UNIT per tablet Take by mouth.    . traZODone (DESYREL) 50 MG tablet Take 1 tablet (50 mg total) by mouth at bedtime. 30 tablet 2   No current facility-administered medications for this visit.     Medical Decision Making:  Established Problem, Stable/Improving (1)  Treatment Plan Summary:Medication management and Plan   Decrease Abilify 2.5 mg daily. Patient was given 5 mg pill and she will take half pill on a daily basis.  I will titrate the dose of BuSpar 10 mg in the morning and 5 mg twice a day. She will continue  coconut oil massage in her head before shampoo and she agreed with the plan. Continue  trazodone 50 mg by mouth daily at bedtime when necessary and she agreed with the plan.   Patient will follow up in 3 months.    More than 50% of the time spent in psychoeducation, counseling and coordination of care.    This note was generated in part or whole with voice recognition software. Voice regonition is usually quite accurate but there are transcription errors that can and very often do occur. I apologize for any typographical errors that were not detected and corrected.   Brandy HaleUzma Tyreck Bell, MD  09/14/2016, 10:22 AM

## 2016-12-14 ENCOUNTER — Encounter: Payer: Self-pay | Admitting: Psychiatry

## 2016-12-14 ENCOUNTER — Ambulatory Visit (INDEPENDENT_AMBULATORY_CARE_PROVIDER_SITE_OTHER): Payer: Medicare Other | Admitting: Psychiatry

## 2016-12-14 VITALS — BP 113/72 | HR 63 | Temp 99.0°F | Wt 136.8 lb

## 2016-12-14 DIAGNOSIS — F3131 Bipolar disorder, current episode depressed, mild: Secondary | ICD-10-CM

## 2016-12-14 MED ORDER — BUSPIRONE HCL 10 MG PO TABS: ORAL_TABLET | ORAL | 3 refills | Status: DC

## 2016-12-14 MED ORDER — ARIPIPRAZOLE 5 MG PO TABS
5.0000 mg | ORAL_TABLET | Freq: Every day | ORAL | 2 refills | Status: DC
Start: 2016-12-14 — End: 2017-03-15

## 2016-12-14 MED ORDER — BUSPIRONE HCL 10 MG PO TABS: 10.0000 mg | ORAL_TABLET | Freq: Three times a day (TID) | ORAL | 3 refills | Status: DC

## 2016-12-14 MED ORDER — TRAZODONE HCL 50 MG PO TABS: 50.0000 mg | ORAL_TABLET | Freq: Every day | ORAL | 2 refills | Status: DC

## 2016-12-14 NOTE — Progress Notes (Signed)
BH MD/PA/NP OP Progress Note  12/14/2016 9:46 AM Adriana Vasquez  MRN:  [id number]  Subjective:  Patient is a 71 year old female with history of bipolar disorder who presented for the follow-up appointment.  She appeared well-groomed as usual.Pt  reported that she celebrated her birthday last week with her friends. Patient reported that she also received gifts from her friends and she was very excited about the same. Patient reported that her sister who is currently in the nursing home in [facility] also had her birthday in October. She was discussing in detail about the condition of her sister who is paraplegic. Patient reported that her itching is getting better with the help of the BuSpar. She wants to go higher on the dose of the medication. She appeared calm and alert during the interview. Patient reported that her brother is moving to [facility] and she is excited about the same. He has put his house on sale [place]. She reported that he has never lived in the [place]. She reported that she is looking forward to the same. Patient currently denied having any suicidal ideations or plans. She appeared calm and alert during the interview.        Chief Complaint:  Chief Complaint    Follow-up; Medication Refill     Visit Diagnosis:     ICD-10-CM   1. Bipolar affective disorder, currently depressed, mild (HCC) F31.31     Past Medical History:  Past Medical History:  Diagnosis Date  . Bipolar disorder (HCC)   . Personal history of tobacco use, presenting hazards to health 01/22/2015    Past Surgical History:  Procedure Laterality Date  . ABDOMINAL HYSTERECTOMY    . HAND SURGERY Right 2001   Patient not sure of what exactly was done.  Repared index finger knuckle area.   Family History:  Family History  Problem Relation Age of Onset  . Arthritis Mother   . Heart attack Father   . Diabetes Father   . Breast cancer Neg Hx    Social History:   Social History   Social History  . Marital status: Widowed    Spouse name: N/A  . Number of children: N/A  . Years of education: N/A   Social History Main Topics  . Smoking status: Current Every Day Smoker    Packs/day: 1.00    Years: 35.00    Types: Cigarettes    Start date: 09/27/1979  . Smokeless tobacco: Never Used     Comment: Has cut back to 1/2 pack  . Alcohol use No  . Drug use: No  . Sexual activity: Not Currently   Other Topics Concern  . None   Social History Narrative  . None   Additional History:     Assessment:   Musculoskeletal: Strength & Muscle Tone: within normal limits Gait & Station: normal, Patient does take small steps Patient leans: N/A  Psychiatric Specialty Exam: Medication Refill     Review of Systems  Skin: Positive for itching.  Psychiatric/Behavioral: Negative for depression, hallucinations, memory loss, substance abuse and suicidal ideas. The patient is not nervous/anxious and does not have insomnia (States she sleeps about 7 hours with the Vistaril. Hypersomnia in the daytime as noted above.).   All other systems reviewed and are negative.   Blood pressure 113/72, pulse 63, temperature 99 F (37.2 C), temperature source Oral, weight 136 lb 12.8 oz (62.1 kg).Body mass index is 26.72 kg/m.  General Appearance:  Neat and Well Groomed  Eye Contact:  Good  Speech:  Normal Rate  Volume:  Normal  Mood:  Good  Affect:  Congruent  Thought Process:  Linear and Logical  Orientation:  Full (Time, Place, and Person)  Thought Content:  Negative  Suicidal Thoughts:  No  Homicidal Thoughts:  No  Memory:  Immediate;   Good Recent;   Good Remote;   Good  Judgement:  Good  Insight:  Good  Psychomotor Activity:  Negative  Concentration:  Good  Recall:  Good  Fund of Knowledge: Good  Language: Good  Akathisia:  Negative  Handed:  Right unknown   AIMS (if indicated):  Done on 01/14/15 normal  Assets:  Communication Skills Desire for  Improvement Social Support  ADL's:  Intact  Cognition: WNL  Sleep:  good   Is the patient at risk to self?  No. Has the patient been a risk to self in the past 6 months?  No. Has the patient been a risk to self within the distant past?  No. Is the patient a risk to others?  No. Has the patient been a risk to others in the past 6 months?  No. Has the patient been a risk to others within the distant past?  No.  Current Medications: Current Outpatient Prescriptions  Medication Sig Dispense Refill  . ARIPiprazole (ABILIFY) 5 MG tablet Take 1 tablet (5 mg total) by mouth daily. 30 tablet 2  . busPIRone (BUSPAR) 10 MG tablet Take 1 tablet (10 mg total) by mouth 3 (three) times daily. 90 tablet 3  . Calcium Carbonate-Vitamin D 600-400 MG-UNIT per tablet Take by mouth.    . traZODone (DESYREL) 50 MG tablet Take 1 tablet (50 mg total) by mouth at bedtime. 30 tablet 2   No current facility-administered medications for this visit.     Medical Decision Making:  Established Problem, Stable/Improving (1)  Treatment Plan Summary:Medication management and Plan   Decrease Abilify 2.5 mg daily. Patient was given 5 mg pill and she will take half pill on a daily basis.  I will titrate the dose of BuSpar 10 mg TID. She was given instructions about the medications. Continue  trazodone 50 mg by mouth daily at bedtime when necessary and she agreed with the plan.   Patient will follow up in 3 months.    More than 50% of the time spent in psychoeducation, counseling and coordination of care.    This note was generated in part or whole with voice recognition software. Voice regonition is usually quite accurate but there are transcription errors that can and very often do occur. I apologize for any typographical errors that were not detected and corrected.   Brandy HaleUzma Bronislaw Switzer, MD  12/14/2016, 9:46 AM

## 2017-01-18 ENCOUNTER — Other Ambulatory Visit: Payer: Self-pay | Admitting: Infectious Diseases

## 2017-01-18 DIAGNOSIS — Z1231 Encounter for screening mammogram for malignant neoplasm of breast: Secondary | ICD-10-CM

## 2017-02-15 IMAGING — CT CT CHEST LUNG CANCER SCREENING LOW DOSE W/O CM
2 of 4 series · 15 of 40 positions shown, 18 images · non-contrast
Comparison: No priors.

CLINICAL DATA: 69-year-old female current smoker with 35 pack-year
history of smoking. Lung cancer screening examination.

EXAM:
CT CHEST WITHOUT CONTRAST
TECHNIQUE: Multidetector CT imaging of the chest was performed following the
standard protocol without IV contrast.

[Series 3: routine chest wo · axial · 0.61mm/px · z∈[-533,-323]mm · 12 of 50 slices shown, 15 images]
[im 4/50  mediastinal]
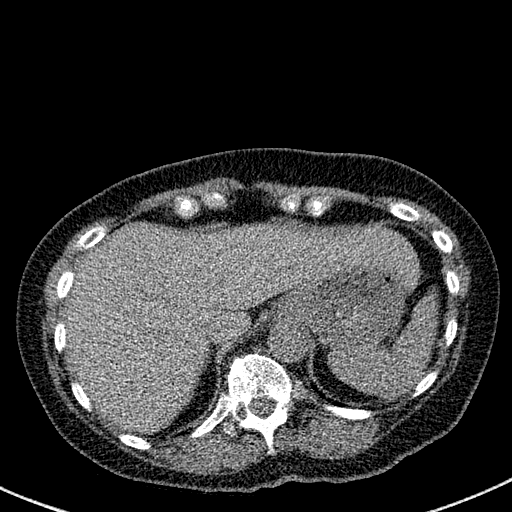
[im 4/50  lung]
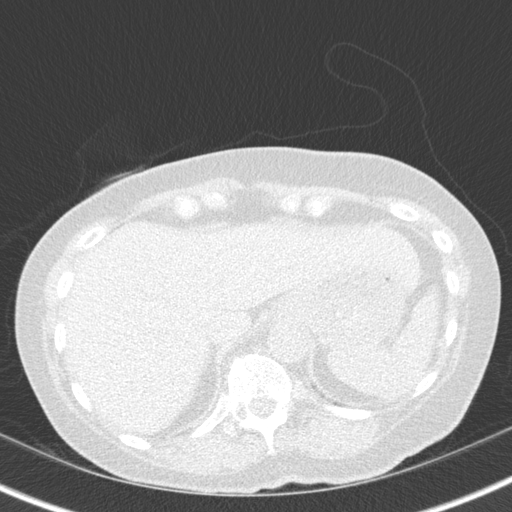
[im 8/50  lung]
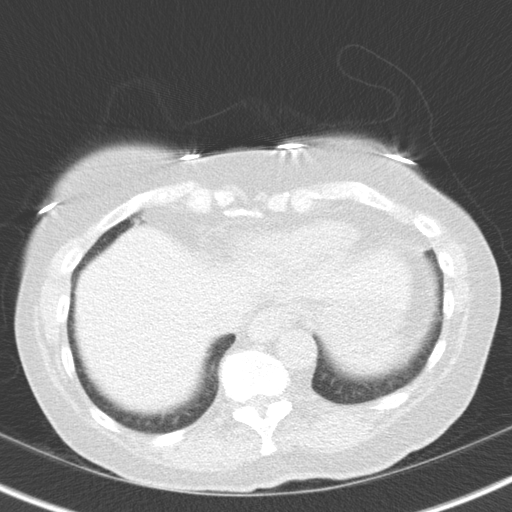
[im 12/50  lung]
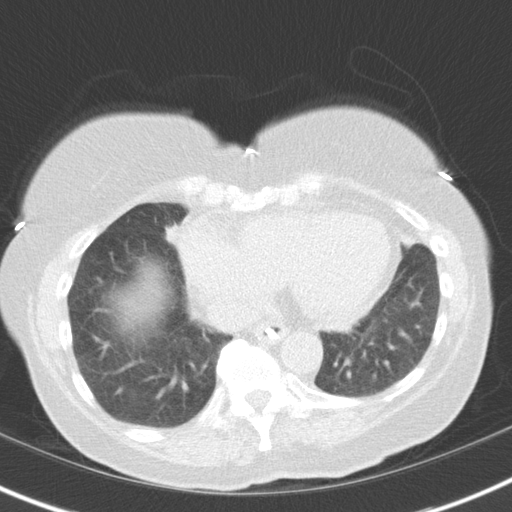
[im 16/50  lung]
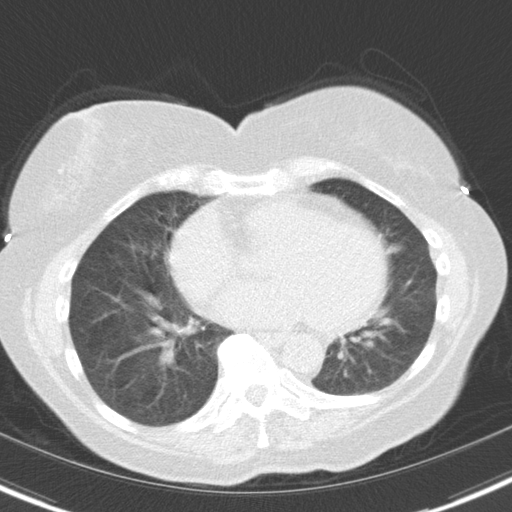
[im 19/50  mediastinal]
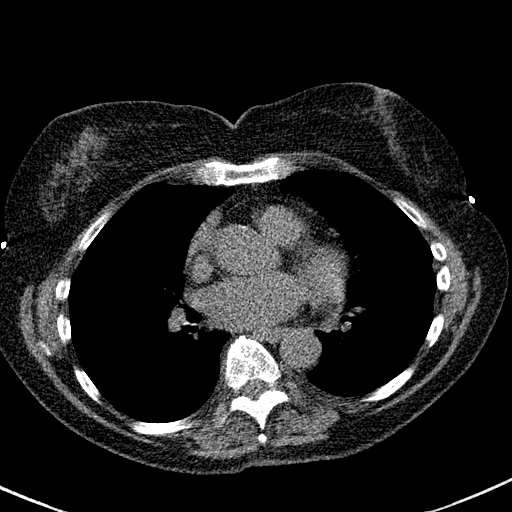
[im 19/50  lung]
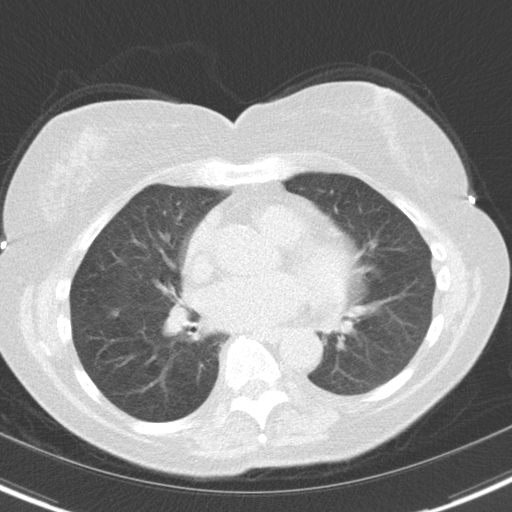
[im 23/50  lung]
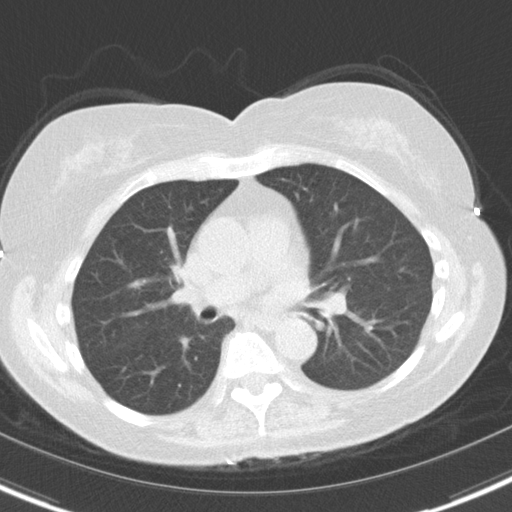
[im 27/50  lung]
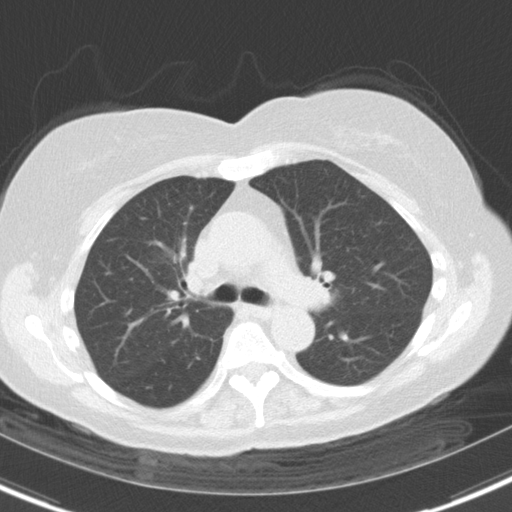
[im 31/50  lung]
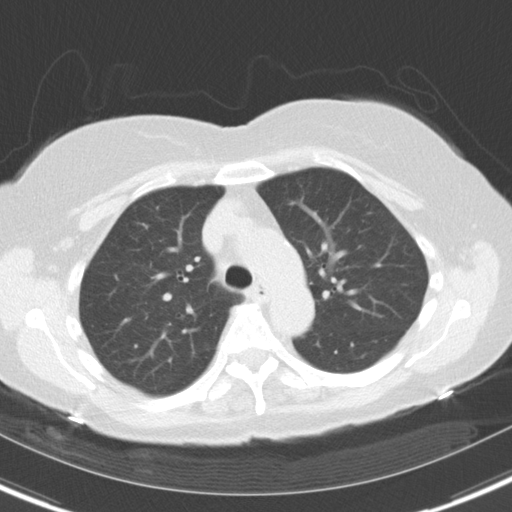
[im 34/50  mediastinal]
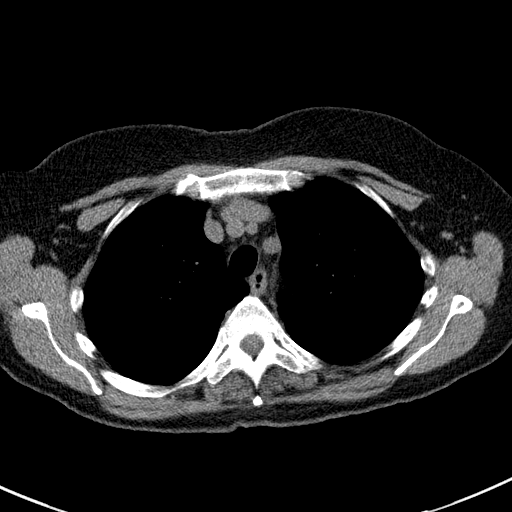
[im 34/50  lung]
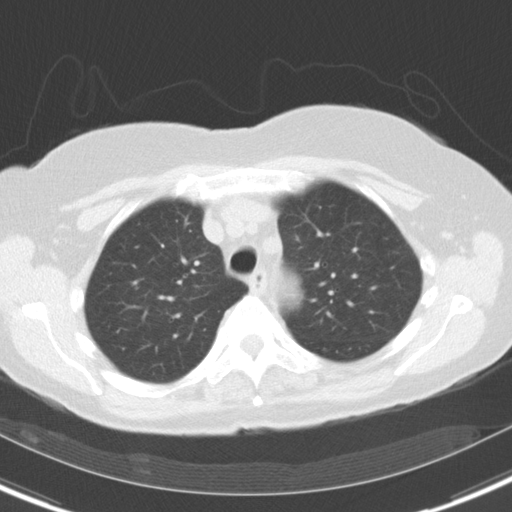
[im 38/50  lung]
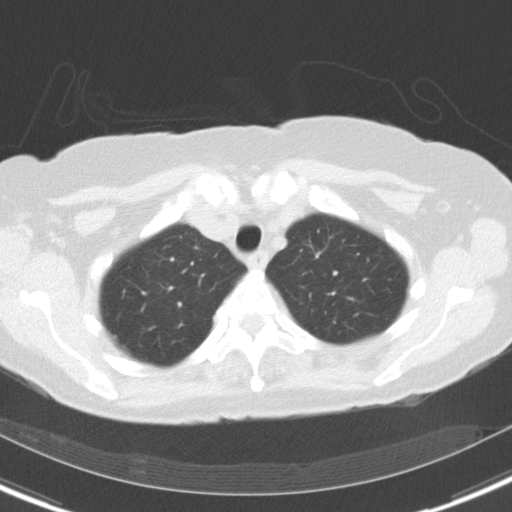
[im 42/50  lung]
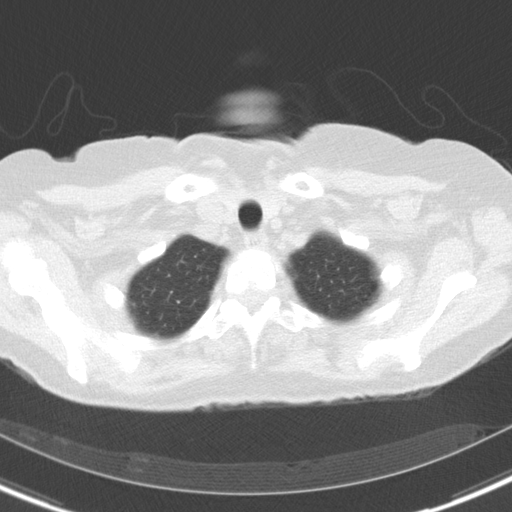
[im 46/50  lung]
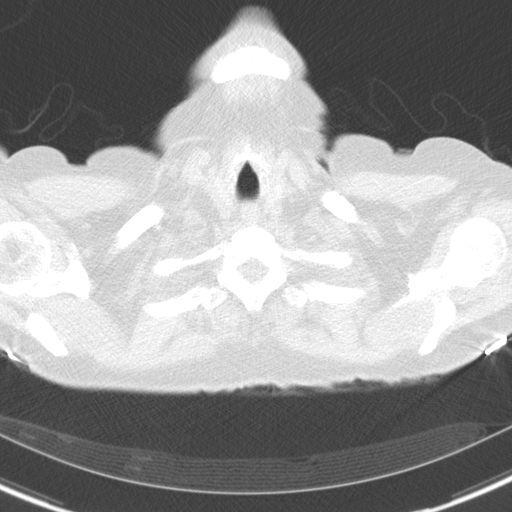

[Series 6: routine chest wo cor · coronal · 0.51mm/px · 3 of 257 slices shown]
[im 52/257  lung]
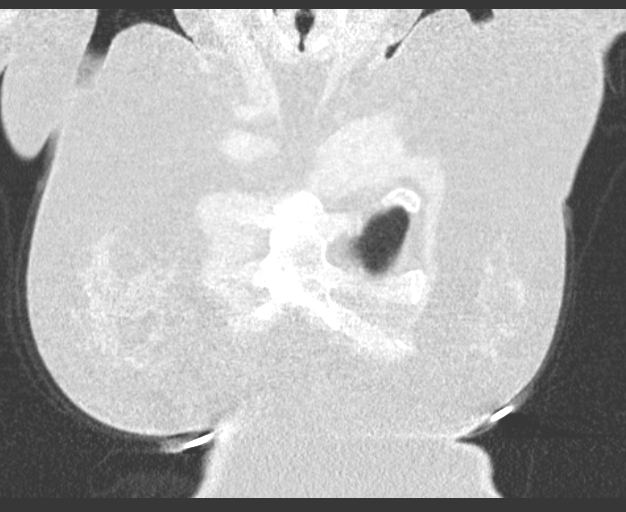
[im 103/257  lung]
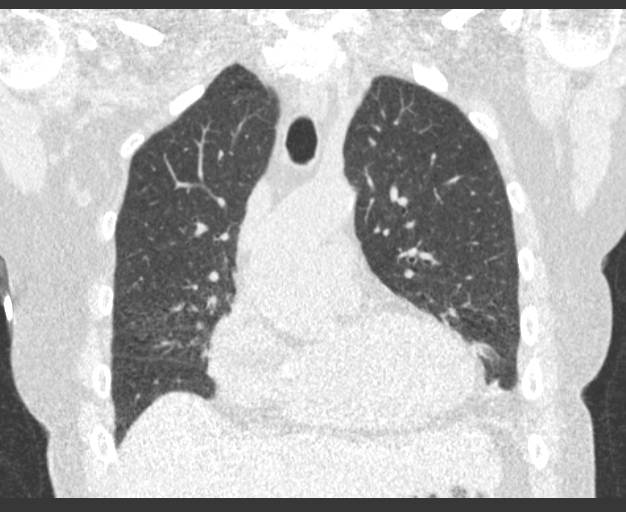
[im 154/257  lung]
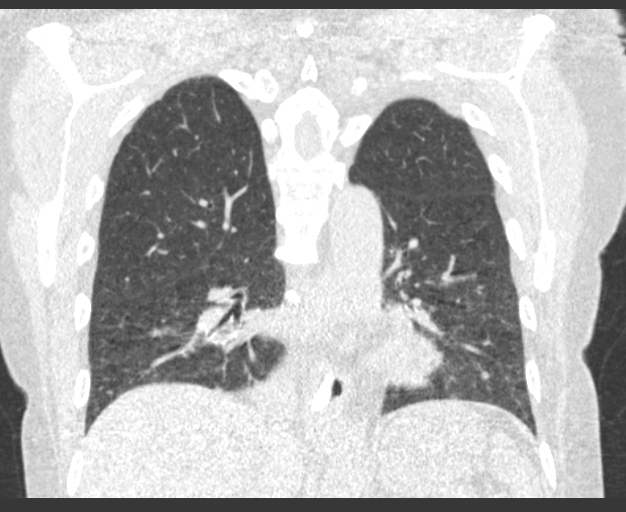

[15 of 40 positions shown; findings below may reference images not displayed]

FINDINGS: Mediastinum/Lymph Nodes: Heart size is normal. There is no
significant pericardial fluid, thickening or pericardial
calcification. There is atherosclerosis of the thoracic aorta, the
great vessels of the mediastinum and the coronary arteries,
including calcified atherosclerotic plaque in the left anterior
descending coronary artery. No pathologically enlarged mediastinal
or hilar lymph nodes. Please note that accurate exclusion of hilar
adenopathy is limited on noncontrast CT scans. High attenuation
structure in the distal third of the esophagus, presumably an
ingested pill. Esophagus is otherwise unremarkable in appearance. No
axillary lymphadenopathy.

Lungs/Pleura: Several small pulmonary nodules are noted in the right
lung, the largest of which is in the anterior aspect of the inferior
right lower lobe abutting the major fissure (image 209 of series 4)
with a volume derived mean diameter of 5.9 mm. No larger more
suspicious appearing pulmonary nodules or masses are otherwise
noted. No acute consolidative airspace disease. No pleural
effusions.

Upper Abdomen: Unremarkable.

Musculoskeletal/Soft Tissues: There are no aggressive appearing
lytic or blastic lesions noted in the visualized portions of the
skeleton.
IMPRESSION: 1. Lung-RADS Category 2S, benign appearance or behavior. Continue
annual screening with low-dose chest CT without contrast in 12
months.
2. The "S" modifier above refers to potentially clinically
significant non lung cancer related findings. Specifically, there is
atherosclerosis, including left anterior descending coronary artery
disease. Please note that although the presence of coronary artery
calcium documents the presence of coronary artery disease, the
severity of this disease and any potential stenosis cannot be
assessed on this non-gated CT examination. Assessment for potential
risk factor modification, dietary therapy or pharmacologic therapy
may be warranted, if clinically indicated.

## 2017-03-05 ENCOUNTER — Ambulatory Visit
Admission: RE | Admit: 2017-03-05 | Discharge: 2017-03-05 | Disposition: A | Discharge status: Home / Self Care | Payer: Medicare Other | Source: Ambulatory Visit | Attending: Infectious Diseases | Admitting: Infectious Diseases

## 2017-03-05 DIAGNOSIS — Z1231 Encounter for screening mammogram for malignant neoplasm of breast: Secondary | ICD-10-CM | POA: Insufficient documentation

## 2017-03-15 ENCOUNTER — Encounter: Payer: Self-pay | Admitting: Psychiatry

## 2017-03-15 ENCOUNTER — Ambulatory Visit: Payer: Medicare Other | Admitting: Psychiatry

## 2017-03-15 ENCOUNTER — Other Ambulatory Visit: Payer: Self-pay

## 2017-03-15 VITALS — BP 119/76 | HR 83 | Temp 97.4°F | Wt 137.6 lb

## 2017-03-15 DIAGNOSIS — F411 Generalized anxiety disorder: Secondary | ICD-10-CM

## 2017-03-15 DIAGNOSIS — F3131 Bipolar disorder, current episode depressed, mild: Secondary | ICD-10-CM

## 2017-03-15 MED ORDER — ARIPIPRAZOLE 5 MG PO TABS: 5.0000 mg | ORAL_TABLET | Freq: Every day | ORAL | 2 refills | Status: DC

## 2017-03-15 MED ORDER — BUSPIRONE HCL 10 MG PO TABS: 10.0000 mg | ORAL_TABLET | Freq: Three times a day (TID) | ORAL | 3 refills | Status: DC

## 2017-03-15 MED ORDER — TRAZODONE HCL 50 MG PO TABS: 50.0000 mg | ORAL_TABLET | Freq: Every day | ORAL | 2 refills | Status: DC

## 2017-03-15 NOTE — Progress Notes (Signed)
BH MD/PA/NP OP Progress Note  03/15/2017 10:49 AM Adriana Vasquez  MRN:  [id number]  Subjective:  Patient is a 72 year old female with history of bipolar disorder who presented for the follow-up appointment.  She appeared well-groomed as usual.Pt  reported that she is planning for the Valentine's Day and is excited about the same. She reported that she is already buying stuff for the Valentine's Day and has been going around shopping. She reported that she is planning to visit her sister in [facility] tomorrow. She is going to give her some stuff for the Valentine's Day. Patient reported that she has been feeling well and has been taking her medications. BuSpar is helping with her itching as well as the coconut oil. She is compliant with her medications. She enjoys watching TV in the evening. She has been sleeping well with the help of the trazodone. No acute issues noted at this time. She denied having any suicidal homicidal ideations or plans.      Chief Complaint:  Chief Complaint    Follow-up; Medication Refill     Visit Diagnosis:     ICD-10-CM   1. Bipolar affective disorder, currently depressed, mild (HCC) F31.31   2. Anxiety state F41.1     Past Medical History:  Past Medical History:  Diagnosis Date  . Bipolar disorder (HCC)   . Personal history of tobacco use, presenting hazards to health 01/22/2015    Past Surgical History:  Procedure Laterality Date  . ABDOMINAL HYSTERECTOMY    . HAND SURGERY Right 2001   Patient not sure of what exactly was done.  Repared index finger knuckle area.   Family History:  Family History  Problem Relation Age of Onset  . Arthritis Mother   . Heart attack Father   . Diabetes Father   . Breast cancer Neg Hx    Social History:  Social History   Socioeconomic History  . Marital status: Widowed    Spouse name: None  . Number of children: None  . Years of education: None  . Highest education level: None  Social Needs  . Financial  resource strain: Not hard at all  . Food insecurity - worry: Never true  . Food insecurity - inability: Never true  . Transportation needs - medical: No  . Transportation needs - non-medical: No  Occupational History  . None  Tobacco Use  . Smoking status: Current Every Day Smoker    Packs/day: 1.00    Years: 35.00    Pack years: 35.00    Types: Cigarettes    Start date: 09/27/1979  . Smokeless tobacco: Never Used  . Tobacco comment: Has cut back to 1/2 pack  Substance and Sexual Activity  . Alcohol use: No    Alcohol/week: 0.0 oz  . Drug use: No  . Sexual activity: Not Currently  Other Topics Concern  . None  Social History Narrative  . None   Additional History:     Assessment:   Musculoskeletal: Strength & Muscle Tone: within normal limits Gait & Station: normal, Patient does take small steps Patient leans: N/A  Psychiatric Specialty Exam: Medication Refill     Review of Systems  Skin: Positive for itching.  Psychiatric/Behavioral: Negative for depression, hallucinations, memory loss, substance abuse and suicidal ideas. The patient is not nervous/anxious and does not have insomnia (States she sleeps about 7 hours with the Vistaril. Hypersomnia in the daytime as noted above.).   All other systems reviewed and are negative.  Blood pressure 119/76, pulse 83, temperature (!) 97.4 F (36.3 C), temperature source Oral, weight 137 lb 9.6 oz (62.4 kg).Body mass index is 26.87 kg/m.  General Appearance: Neat and Well Groomed  Eye Contact:  Good  Speech:  Normal Rate  Volume:  Normal  Mood:  Good  Affect:  Congruent  Thought Process:  Linear and Logical  Orientation:  Full (Time, Place, and Person)  Thought Content:  Negative  Suicidal Thoughts:  No  Homicidal Thoughts:  No  Memory:  Immediate;   Good Recent;   Good Remote;   Good  Judgement:  Good  Insight:  Good  Psychomotor Activity:  Negative  Concentration:  Good  Recall:  Good  Fund of Knowledge: Good   Language: Good  Akathisia:  Negative  Handed:  Right unknown   AIMS (if indicated):  Done on 01/14/15 normal  Assets:  Communication Skills Desire for Improvement Social Support  ADL's:  Intact  Cognition: WNL  Sleep:  good   Is the patient at risk to self?  No. Has the patient been a risk to self in the past 6 months?  No. Has the patient been a risk to self within the distant past?  No. Is the patient a risk to others?  No. Has the patient been a risk to others in the past 6 months?  No. Has the patient been a risk to others within the distant past?  No.  Current Medications: Current Outpatient Medications  Medication Sig Dispense Refill  . ARIPiprazole (ABILIFY) 5 MG tablet Take 1 tablet (5 mg total) by mouth daily. 30 tablet 2  . busPIRone (BUSPAR) 10 MG tablet Take 1 tablet (10 mg total) by mouth 3 (three) times daily. 90 tablet 3  . Calcium Carbonate-Vitamin D 600-400 MG-UNIT per tablet Take by mouth.    . traZODone (DESYREL) 50 MG tablet Take 1 tablet (50 mg total) by mouth at bedtime. 30 tablet 2   No current facility-administered medications for this visit.     Medical Decision Making:  Established Problem, Stable/Improving (1)  Treatment Plan Summary:Medication management and Plan   Continue Abilify 2.5 mg daily. Patient was given 5 mg pill and she will take half pill on a daily basis.  I will titrate the dose of BuSpar 10 mg TID. She was given instructions about the medications. Continue  trazodone 50 mg by mouth daily at bedtime when necessary and she agreed with the plan.   Patient will follow up in 3 months.    More than 50% of the time spent in psychoeducation, counseling and coordination of care.    This note was generated in part or whole with voice recognition software. Voice regonition is usually quite accurate but there are transcription errors that can and very often do occur. I apologize for any typographical errors that were not detected and  corrected.   Brandy HaleUzma Kadir Azucena, MD  03/15/2017, 10:49 AM

## 2017-06-05 ENCOUNTER — Other Ambulatory Visit: Payer: Self-pay | Admitting: Psychiatry

## 2017-06-07 ENCOUNTER — Ambulatory Visit: Payer: Medicare Other | Admitting: Psychiatry

## 2017-06-07 ENCOUNTER — Encounter: Payer: Self-pay | Admitting: Psychiatry

## 2017-06-07 ENCOUNTER — Other Ambulatory Visit: Payer: Self-pay

## 2017-06-07 VITALS — BP 109/71 | HR 85 | Temp 98.5°F | Wt 139.8 lb

## 2017-06-07 DIAGNOSIS — F3131 Bipolar disorder, current episode depressed, mild: Secondary | ICD-10-CM | POA: Diagnosis not present

## 2017-06-07 DIAGNOSIS — F411 Generalized anxiety disorder: Secondary | ICD-10-CM

## 2017-06-07 MED ORDER — BUSPIRONE HCL 10 MG PO TABS: 10.0000 mg | ORAL_TABLET | Freq: Three times a day (TID) | ORAL | 3 refills | Status: DC

## 2017-06-07 MED ORDER — ARIPIPRAZOLE 5 MG PO TABS: 5.0000 mg | ORAL_TABLET | Freq: Every day | ORAL | 2 refills | Status: DC

## 2017-06-07 MED ORDER — TRAZODONE HCL 50 MG PO TABS: 50.0000 mg | ORAL_TABLET | Freq: Every day | ORAL | 2 refills | Status: DC

## 2017-06-07 NOTE — Progress Notes (Signed)
BH MD/PA/NP OP Progress Note  06/07/2017 10:53 AM Adriana Vasquez  MRN:  [id number]  Subjective:  Patient is a 72 year old female with history of bipolar disorder who presented for the follow-up appointment.  She appeared well-groomed as usual.Pt reported that her sister passed away last 05-10-2022.  She appeared sad and tearful during the interview.  She reported that her sister was followed by the hospice and she was unable to swallow.  She reported that she met her one night before her death.  She was trying to feed her ice cream but she was having a difficult time.  When it was raining hard and people were having a difficult time to come for her funeral.  Patient stated that now she is hoping with her death and her friends and and are supportive.  She was discussing in detail about her death.  Patient reported that she is also trying to plan for the spring and will plan to some flowers.  We discussed about her medications in detail.  She remains compliant with them.  She reporting that her itching is also improving and she is taking her medications as prescribed.  She denied having any suicidal or homicidal ideations or plans.  She denied having any perceptual disturbances.  She has been sleeping well at night.        Chief Complaint:  Chief Complaint    Follow-up; Medication Refill     Visit Diagnosis:     ICD-10-CM   1. Bipolar affective disorder, currently depressed, mild ([place]) F31.31   2. Anxiety state F41.1     Past Medical History:  Past Medical History:  Diagnosis Date  . Bipolar disorder ([place])   . Personal history of tobacco use, presenting hazards to health 01/22/2015    Past Surgical History:  Procedure Laterality Date  . ABDOMINAL HYSTERECTOMY    . HAND SURGERY Right 2001   Patient not sure of what exactly was done.  Repared index finger knuckle area.   Family History:  Family History  Problem Relation Age of Onset  . Arthritis Mother   . Heart attack Father   .  Diabetes Father   . Breast cancer Neg Hx    Social History:  Social History   Socioeconomic History  . Marital status: Widowed    Spouse name: Not on file  . Number of children: Not on file  . Years of education: Not on file  . Highest education level: Not on file  Occupational History  . Not on file  Social Needs  . Financial resource strain: Not hard at all  . Food insecurity:    Worry: Never true    Inability: Never true  . Transportation needs:    Medical: No    Non-medical: No  Tobacco Use  . Smoking status: Current Every Day Smoker    Packs/day: 1.00    Years: 35.00    Pack years: 35.00    Types: Cigarettes    Start date: 09/27/1979  . Smokeless tobacco: Never Used  . Tobacco comment: Has cut back to 1/2 pack  Substance and Sexual Activity  . Alcohol use: No    Alcohol/week: 0.0 oz  . Drug use: No  . Sexual activity: Not Currently  Lifestyle  . Physical activity:    Days per week: Not on file    Minutes per session: Not on file  . Stress: Not on file  Relationships  . Social connections:    Talks on phone: Not on  file    Gets together: Not on file    Attends religious service: Not on file    Active member of club or organization: Not on file    Attends meetings of clubs or organizations: Not on file    Relationship status: Not on file  Other Topics Concern  . Not on file  Social History Narrative  . Not on file   Additional History:     Assessment:   Musculoskeletal: Strength & Muscle Tone: within normal limits Gait & Station: normal, Patient does take small steps Patient leans: N/A  Psychiatric Specialty Exam: Medication Refill     Review of Systems  Skin: Positive for itching.  Psychiatric/Behavioral: Negative for depression, hallucinations, memory loss, substance abuse and suicidal ideas. The patient is not nervous/anxious and does not have insomnia (States she sleeps about 7 hours with the Vistaril. Hypersomnia in the daytime as noted  above.).   All other systems reviewed and are negative.   Blood pressure 109/71, pulse 85, temperature 98.5 F (36.9 C), temperature source Oral, weight 139 lb 12.8 oz (63.4 kg).Body mass index is 27.3 kg/m.  General Appearance: Neat and Well Groomed  Eye Contact:  Good  Speech:  Normal Rate  Volume:  Normal  Mood:  Good  Affect:  Congruent  Thought Process:  Linear and Logical  Orientation:  Full (Time, Place, and Person)  Thought Content:  Negative  Suicidal Thoughts:  No  Homicidal Thoughts:  No  Memory:  Immediate;   Good Recent;   Good Remote;   Good  Judgement:  Good  Insight:  Good  Psychomotor Activity:  Negative  Concentration:  Good  Recall:  Good  Fund of Knowledge: Good  Language: Good  Akathisia:  Negative  Handed:  Right unknown   AIMS (if indicated):  Done on 01/14/15 normal  Assets:  Communication Skills Desire for Improvement Social Support  ADL's:  Intact  Cognition: WNL  Sleep:  good   Is the patient at risk to self?  No. Has the patient been a risk to self in the past 6 months?  No. Has the patient been a risk to self within the distant past?  No. Is the patient a risk to others?  No. Has the patient been a risk to others in the past 6 months?  No. Has the patient been a risk to others within the distant past?  No.  Current Medications: Current Outpatient Medications  Medication Sig Dispense Refill  . ARIPiprazole (ABILIFY) 5 MG tablet Take 1 tablet (5 mg total) by mouth daily. 30 tablet 2  . busPIRone (BUSPAR) 10 MG tablet Take 1 tablet (10 mg total) by mouth 3 (three) times daily. 90 tablet 3  . Calcium Carbonate-Vitamin D 600-400 MG-UNIT per tablet Take by mouth.    . traZODone (DESYREL) 50 MG tablet Take 1 tablet (50 mg total) by mouth at bedtime. 30 tablet 2   No current facility-administered medications for this visit.     Medical Decision Making:  Established Problem, Stable/Improving (1)  Treatment Plan Summary:Medication management  and Plan   Continue Abilify '5mg'$  daily.    BuSpar 10 mg TID. She was given instructions about the medications. Continue  trazodone 50 mg by mouth daily at bedtime when necessary and she agreed with the plan.   Patient will follow up in 2 months.    More than 50% of the time spent in psychoeducation, counseling and coordination of care.    This note was generated  in part or whole with voice recognition software. Voice regonition is usually quite accurate but there are transcription errors that can and very often do occur. I apologize for any typographical errors that were not detected and corrected.   Rainey Pines, MD  06/07/2017, 10:53 AM

## 2017-06-28 ENCOUNTER — Telehealth: Payer: Self-pay

## 2017-06-28 NOTE — Telephone Encounter (Signed)
pt called states that the trazodone is not working she is not able to sleep she was offered an appt for thur but she already has an appt for thur afternoon somewhere else and can not come in.

## 2017-07-05 ENCOUNTER — Encounter: Payer: Self-pay | Admitting: Psychiatry

## 2017-07-05 ENCOUNTER — Ambulatory Visit: Payer: Medicare Other | Admitting: Psychiatry

## 2017-07-05 ENCOUNTER — Other Ambulatory Visit: Payer: Self-pay | Admitting: Psychiatry

## 2017-07-05 ENCOUNTER — Other Ambulatory Visit: Payer: Self-pay

## 2017-07-05 VITALS — BP 100/67 | HR 84 | Temp 98.5°F | Wt 138.0 lb

## 2017-07-05 DIAGNOSIS — F411 Generalized anxiety disorder: Secondary | ICD-10-CM | POA: Diagnosis not present

## 2017-07-05 DIAGNOSIS — F3131 Bipolar disorder, current episode depressed, mild: Secondary | ICD-10-CM

## 2017-07-05 MED ORDER — ALPRAZOLAM 0.25 MG PO TABS: 0.2500 mg | ORAL_TABLET | Freq: Every evening | ORAL | 0 refills | Status: DC | PRN

## 2017-07-05 NOTE — Progress Notes (Signed)
BH MD/PA/NP OP Progress Note  07/05/2017 10:53 AM Adriana Vasquez  MRN:  [id number]  Subjective:  Patient is a 72 year old female with history of bipolar disorder who presented for the follow-up appointment.  She appeared well-groomed as usual.Pt reported that he has been becoming more depressed and anxious since her sister passed away.  She reported that she is unable to sleep at night.  Patient reported that she has only slept 2 days in the past 7 days.  She reported that she is she has had apprehensive and is also having some work done in her house.  She has a flooring replaced and also the ice cooler in her refrigerator is broken.  Patient reported that she wants medications for insomnia.  I tried her on trazodone last time but it is not working.  She tried taking 100 mg but it did not help.  Patient has been taking hydroxyzine in the past but now her insurance is not paying for the medication.    Also tried prescribing her Ambien and Sonata but they are not approved by her insurance and they will not pay for her medications.  Patient appears calm and alert during the interview.  She currently denied having any suicidal homicidal ideations or plans.             Chief Complaint:  Chief Complaint    Follow-up; Medication Refill     Visit Diagnosis:     ICD-10-CM   1. Bipolar affective disorder, currently depressed, mild ([facility]) F31.31   2. Anxiety state F41.1     Past Medical History:  Past Medical History:  Diagnosis Date  . Bipolar disorder ([facility])   . Personal history of tobacco use, presenting hazards to health 01/22/2015    Past Surgical History:  Procedure Laterality Date  . ABDOMINAL HYSTERECTOMY    . HAND SURGERY Right 2001   Patient not sure of what exactly was done.  Repared index finger knuckle area.   Family History:  Family History  Problem Relation Age of Onset  . Arthritis Mother   . Heart attack Father   . Diabetes Father   . Breast cancer Neg Hx    Social  History:  Social History   Socioeconomic History  . Marital status: Widowed    Spouse name: Not on file  . Number of children: Not on file  . Years of education: Not on file  . Highest education level: Not on file  Occupational History  . Not on file  Social Needs  . Financial resource strain: Not hard at all  . Food insecurity:    Worry: Never true    Inability: Never true  . Transportation needs:    Medical: No    Non-medical: No  Tobacco Use  . Smoking status: Current Every Day Smoker    Packs/day: 1.00    Years: 35.00    Pack years: 35.00    Types: Cigarettes    Start date: 09/27/1979  . Smokeless tobacco: Never Used  . Tobacco comment: Has cut back to 1/2 pack  Substance and Sexual Activity  . Alcohol use: No    Alcohol/week: 0.0 oz  . Drug use: No  . Sexual activity: Not Currently  Lifestyle  . Physical activity:    Days per week: Not on file    Minutes per session: Not on file  . Stress: Not on file  Relationships  . Social connections:    Talks on phone: Not on file  Gets together: Not on file    Attends religious service: Not on file    Active member of club or organization: Not on file    Attends meetings of clubs or organizations: Not on file    Relationship status: Not on file  Other Topics Concern  . Not on file  Social History Narrative  . Not on file   Additional History:     Assessment:   Musculoskeletal: Strength & Muscle Tone: within normal limits Gait & Station: normal, Patient does take small steps Patient leans: N/A  Psychiatric Specialty Exam: Medication Refill     Review of Systems  Skin: Positive for itching.  Psychiatric/Behavioral: Negative for depression, hallucinations, memory loss, substance abuse and suicidal ideas. The patient is not nervous/anxious and does not have insomnia (States she sleeps about 7 hours with the Vistaril. Hypersomnia in the daytime as noted above.).   All other systems reviewed and are  negative.   Blood pressure 100/67, pulse 84, temperature 98.5 F (36.9 C), temperature source Oral, weight 138 lb (62.6 kg).Body mass index is 26.95 kg/m.  General Appearance: Neat and Well Groomed  Eye Contact:  Good  Speech:  Normal Rate  Volume:  Normal  Mood:  Good  Affect:  Congruent  Thought Process:  Linear and Logical  Orientation:  Full (Time, Place, and Person)  Thought Content:  Negative  Suicidal Thoughts:  No  Homicidal Thoughts:  No  Memory:  Immediate;   Good Recent;   Good Remote;   Good  Judgement:  Good  Insight:  Good  Psychomotor Activity:  Negative  Concentration:  Good  Recall:  Good  Fund of Knowledge: Good  Language: Good  Akathisia:  Negative  Handed:  Right unknown   AIMS (if indicated):  Done on 01/14/15 normal  Assets:  Communication Skills Desire for Improvement Social Support  ADL's:  Intact  Cognition: WNL  Sleep:  good   Is the patient at risk to self?  No. Has the patient been a risk to self in the past 6 months?  No. Has the patient been a risk to self within the distant past?  No. Is the patient a risk to others?  No. Has the patient been a risk to others in the past 6 months?  No. Has the patient been a risk to others within the distant past?  No.  Current Medications: Current Outpatient Medications  Medication Sig Dispense Refill  . ARIPiprazole (ABILIFY) 5 MG tablet Take 1 tablet (5 mg total) by mouth daily. 30 tablet 2  . busPIRone (BUSPAR) 10 MG tablet Take 1 tablet (10 mg total) by mouth 3 (three) times daily. 90 tablet 3  . Calcium Carbonate-Vitamin D 600-400 MG-UNIT per tablet Take by mouth.    . ALPRAZolam (XANAX) 0.25 MG tablet Take 1 tablet (0.25 mg total) by mouth at bedtime as needed for anxiety. 30 tablet 0   No current facility-administered medications for this visit.     Medical Decision Making:  Established Problem, Stable/Improving (1)  Treatment Plan Summary:Medication management and Plan   Continue  Abilify  daily.    BuSpar 10 mg TID. She was given instructions about the medications. D/c   trazodone as it is ineffective. Patient insurance will not pay for Ambien Sonata or hydroxyzine Will start her on Xanax 0.25 mg half to 1 pill at bedtime to help with anxiety and insomnia.  We discussed with her about the side effects of the medication in detail and she  agreed with the plan.   Patient will follow up in 1 months.    More than 50% of the time spent in psychoeducation, counseling and coordination of care.    This note was generated in part or whole with voice recognition software. Voice regonition is usually quite accurate but there are transcription errors that can and very often do occur. I apologize for any typographical errors that were not detected and corrected.   Brandy Hale, MD  07/05/2017, 10:53 AM

## 2017-08-02 ENCOUNTER — Ambulatory Visit: Payer: Medicare Other | Admitting: Psychiatry

## 2017-08-09 ENCOUNTER — Ambulatory Visit: Payer: Medicare Other | Admitting: Psychiatry

## 2017-08-16 ENCOUNTER — Encounter: Payer: Self-pay | Admitting: Psychiatry

## 2017-08-16 ENCOUNTER — Ambulatory Visit: Payer: Medicare Other | Admitting: Psychiatry

## 2017-08-16 ENCOUNTER — Other Ambulatory Visit: Payer: Self-pay

## 2017-08-16 VITALS — BP 99/61 | HR 70 | Temp 98.1°F | Wt 138.4 lb

## 2017-08-16 DIAGNOSIS — F3131 Bipolar disorder, current episode depressed, mild: Secondary | ICD-10-CM

## 2017-08-16 DIAGNOSIS — F411 Generalized anxiety disorder: Secondary | ICD-10-CM

## 2017-08-16 MED ORDER — ARIPIPRAZOLE 2 MG PO TABS: 2.0000 mg | ORAL_TABLET | Freq: Every day | ORAL | 2 refills | Status: DC

## 2017-08-16 MED ORDER — BUSPIRONE HCL 10 MG PO TABS: 10.0000 mg | ORAL_TABLET | Freq: Three times a day (TID) | ORAL | 3 refills | Status: DC

## 2017-08-16 NOTE — Progress Notes (Signed)
BH MD/PA/NP OP Progress Note  08/16/2017 2:35 PM Adriana Vasquez  MRN:  [id number]  Subjective:  Patient is a 72 year old female with history of bipolar disorder who presented for the follow-up appointment.  She appeared well-groomed as usual.Pt reported that she has been doing well and has been compliant with her medications.  She reported that she was able to sleep well with the help of Xanax.  She was also discussing in detail about her different trips with her friend.  Patient reported that her itching is under control and it is mostly related to her anxiety.  We discussed about her medications in detail.  Patient has been compliant with them.  She does not have any side effects from them.  however she has been taking Abilify for a long time and appears to have some rigidity and gait disturbance related to that.  Patient agreed with the plan to decrease her dose of Abilify at this time.  She is receptive to her medication changes.    Patient appears calm and alert during the interview.  She currently denied having any suicidal homicidal ideations or plans.       Chief Complaint:  Chief Complaint    Follow-up; Medication Refill     Visit Diagnosis:     ICD-10-CM   1. Bipolar affective disorder, currently depressed, mild ([facility]) F31.31   2. Anxiety state F41.1     Past Medical History:  Past Medical History:  Diagnosis Date  . Bipolar disorder ([facility])   . Personal history of tobacco use, presenting hazards to health 01/22/2015    Past Surgical History:  Procedure Laterality Date  . ABDOMINAL HYSTERECTOMY    . HAND SURGERY Right 2001   Patient not sure of what exactly was done.  Repared index finger knuckle area.   Family History:  Family History  Problem Relation Age of Onset  . Arthritis Mother   . Heart attack Father   . Diabetes Father   . Breast cancer Neg Hx    Social History:  Social History   Socioeconomic History  . Marital status: Widowed    Spouse name: Not on  file  . Number of children: Not on file  . Years of education: Not on file  . Highest education level: Not on file  Occupational History  . Not on file  Social Needs  . Financial resource strain: Not hard at all  . Food insecurity:    Worry: Never true    Inability: Never true  . Transportation needs:    Medical: No    Non-medical: No  Tobacco Use  . Smoking status: Current Every Day Smoker    Packs/day: 1.00    Years: 35.00    Pack years: 35.00    Types: Cigarettes    Start date: 09/27/1979  . Smokeless tobacco: Never Used  . Tobacco comment: Has cut back to 1/2 pack  Substance and Sexual Activity  . Alcohol use: No    Alcohol/week: 0.0 oz  . Drug use: No  . Sexual activity: Not Currently  Lifestyle  . Physical activity:    Days per week: Not on file    Minutes per session: Not on file  . Stress: Not on file  Relationships  . Social connections:    Talks on phone: Not on file    Gets together: Not on file    Attends religious service: Not on file    Active member of club or organization: Not on file  Attends meetings of clubs or organizations: Not on file    Relationship status: Not on file  Other Topics Concern  . Not on file  Social History Narrative  . Not on file   Additional History:     Assessment:   Musculoskeletal: Strength & Muscle Tone: within normal limits Gait & Station: normal, Patient does take small steps Patient leans: N/A  Psychiatric Specialty Exam: Medication Refill     Review of Systems  Skin: Positive for itching.  Psychiatric/Behavioral: Negative for depression, hallucinations, memory loss, substance abuse and suicidal ideas. The patient is not nervous/anxious and does not have insomnia (States she sleeps about 7 hours with the Vistaril. Hypersomnia in the daytime as noted above.).   All other systems reviewed and are negative.   Blood pressure 99/61, pulse 70, temperature 98.1 F (36.7 C), temperature source Oral, weight 138  lb 6.4 oz (62.8 kg).Body mass index is 27.03 kg/m.  General Appearance: Neat and Well Groomed  Eye Contact:  Good  Speech:  Normal Rate  Volume:  Normal  Mood:  Good  Affect:  Congruent  Thought Process:  Linear and Logical  Orientation:  Full (Time, Place, and Person)  Thought Content:  Negative  Suicidal Thoughts:  No  Homicidal Thoughts:  No  Memory:  Immediate;   Good Recent;   Good Remote;   Good  Judgement:  Good  Insight:  Good  Psychomotor Activity:  Negative  Concentration:  Good  Recall:  Good  Fund of Knowledge: Good  Language: Good  Akathisia:  Negative  Handed:  Right unknown   AIMS (if indicated):  Done on 01/14/15 normal  Assets:  Communication Skills Desire for Improvement Social Support  ADL's:  Intact  Cognition: WNL  Sleep:  good   Is the patient at risk to self?  No. Has the patient been a risk to self in the past 6 months?  No. Has the patient been a risk to self within the distant past?  No. Is the patient a risk to others?  No. Has the patient been a risk to others in the past 6 months?  No. Has the patient been a risk to others within the distant past?  No.  Current Medications: Current Outpatient Medications  Medication Sig Dispense Refill  . ALPRAZolam (XANAX) 0.25 MG tablet Take 1 tablet (0.25 mg total) by mouth at bedtime as needed for anxiety. 30 tablet 0  . ARIPiprazole (ABILIFY) 2 MG tablet Take 1 tablet (2 mg total) by mouth daily. 30 tablet 2  . busPIRone (BUSPAR) 10 MG tablet Take 1 tablet (10 mg total) by mouth 3 (three) times daily. 90 tablet 3  . Calcium Carbonate-Vitamin D 600-400 MG-UNIT per tablet Take by mouth.     No current facility-administered medications for this visit.     Medical Decision Making:  Established Problem, Stable/Improving (1)  Treatment Plan Summary:Medication management and Plan   Decreased dose of Abilify 2 mg at bedtime.  She will start taking half a pill of her current medication pills   BuSpar  10 mg TID. She was given instructions about the medications. Patient has enough supply of the Xanax.   We discussed with her about the side effects of the medication in detail and she agreed with the plan.   Patient will follow up in  2 months.    More than 50% of the time spent in psychoeducation, counseling and coordination of care.    This note was generated in part  or whole with voice recognition software. Voice regonition is usually quite accurate but there are transcription errors that can and very often do occur. I apologize for any typographical errors that were not detected and corrected.   Brandy HaleUzma Shavona Gunderman, MD  08/16/2017, 2:35 PM

## 2017-09-03 ENCOUNTER — Other Ambulatory Visit: Payer: Self-pay | Admitting: Psychiatry

## 2017-09-06 ENCOUNTER — Telehealth: Payer: Self-pay | Admitting: Nurse Practitioner

## 2017-09-16 ENCOUNTER — Telehealth: Payer: Self-pay | Admitting: *Deleted

## 2017-09-16 DIAGNOSIS — Z87891 Personal history of nicotine dependence: Secondary | ICD-10-CM

## 2017-09-16 DIAGNOSIS — Z122 Encounter for screening for malignant neoplasm of respiratory organs: Secondary | ICD-10-CM

## 2017-09-16 NOTE — Telephone Encounter (Signed)
Notified patient that annual lung cancer screening low dose CT scan is due currently or will be in near future. Confirmed that patient is within the age range of 55-77, and asymptomatic, (no signs or symptoms of lung cancer). Patient denies illness that would prevent curative treatment for lung cancer if found. Verified smoking history, (current, 36.5 pack year). The shared decision making visit was done 01/25/15. Patient is agreeable for CT scan being scheduled.

## 2017-09-24 ENCOUNTER — Ambulatory Visit
Admission: RE | Admit: 2017-09-24 | Discharge: 2017-09-24 | Disposition: A | Discharge status: Home / Self Care | Payer: Medicare Other | Source: Ambulatory Visit | Attending: Nurse Practitioner | Admitting: Nurse Practitioner

## 2017-09-24 DIAGNOSIS — J438 Other emphysema: Secondary | ICD-10-CM | POA: Diagnosis not present

## 2017-09-24 DIAGNOSIS — I700 Atherosclerosis of aorta: Secondary | ICD-10-CM | POA: Diagnosis not present

## 2017-09-24 DIAGNOSIS — Z87891 Personal history of nicotine dependence: Secondary | ICD-10-CM

## 2017-09-24 DIAGNOSIS — Z122 Encounter for screening for malignant neoplasm of respiratory organs: Secondary | ICD-10-CM

## 2017-09-24 DIAGNOSIS — J432 Centrilobular emphysema: Secondary | ICD-10-CM | POA: Diagnosis not present

## 2017-09-28 ENCOUNTER — Encounter: Payer: Self-pay | Admitting: *Deleted

## 2017-10-04 ENCOUNTER — Ambulatory Visit: Payer: Medicare Other | Admitting: Psychiatry

## 2017-10-04 ENCOUNTER — Encounter: Payer: Self-pay | Admitting: Psychiatry

## 2017-10-04 ENCOUNTER — Other Ambulatory Visit: Payer: Self-pay

## 2017-10-04 VITALS — BP 106/69 | HR 78 | Temp 98.2°F | Wt 138.4 lb

## 2017-10-04 DIAGNOSIS — F411 Generalized anxiety disorder: Secondary | ICD-10-CM

## 2017-10-04 DIAGNOSIS — F3131 Bipolar disorder, current episode depressed, mild: Secondary | ICD-10-CM

## 2017-10-04 MED ORDER — BUSPIRONE HCL 10 MG PO TABS: 10.0000 mg | ORAL_TABLET | Freq: Three times a day (TID) | ORAL | 3 refills | Status: DC

## 2017-10-04 MED ORDER — ARIPIPRAZOLE 2 MG PO TABS: 2.0000 mg | ORAL_TABLET | Freq: Every day | ORAL | 2 refills | Status: DC

## 2017-10-04 MED ORDER — TRAZODONE HCL 50 MG PO TABS: 50.0000 mg | ORAL_TABLET | Freq: Every day | ORAL | 3 refills | Status: DC

## 2017-10-04 NOTE — Progress Notes (Signed)
BH MD/PA/NP OP Progress Note  10/04/2017 9:34 AM Adriana Vasquez  MRN:  [id number]  Subjective:  Patient is a 72 year old female with history of bipolar disorder who presented for the follow-up appointment.  She appeared well-groomed as usual.Pt reported that she has been doing well and has been compliant with her medications.  She went to the beach with her brother and reported that she had a good time.  She reported that she enjoyed her visit to the beach and was talking in detail about the same.  She stated that she has been compliant with her medications.  She did well after the Abilify dose was decreased to 2 mg.  She stated that she is sleeping well with the help of trazodone and has stopped taking the Xanax.  She does not have any side effects of the medications at this time.  She takes BuSpar for her anxiety.  She is not having any side effects of the medications.  She occasionally H and has been controlling that with her medications.  She appears calm and alert during the interview.  No acute side effects noted from the medications.    Patient was able to communicate well.     Patient appears calm and alert during the interview.  She currently denied having any suicidal homicidal ideations or plans.       Chief Complaint:  Chief Complaint    Follow-up; Medication Refill     Visit Diagnosis:     ICD-10-CM   1. Bipolar affective disorder, currently depressed, mild (HCC) F31.31   2. Anxiety state F41.1     Past Medical History:  Past Medical History:  Diagnosis Date  . Bipolar disorder (HCC)   . Personal history of tobacco use, presenting hazards to health 01/22/2015    Past Surgical History:  Procedure Laterality Date  . ABDOMINAL HYSTERECTOMY    . HAND SURGERY Right 2001   Patient not sure of what exactly was done.  Repared index finger knuckle area.   Family History:  Family History  Problem Relation Age of Onset  . Arthritis Mother   . Heart attack Father   .  Diabetes Father   . Breast cancer Neg Hx    Social History:  Social History   Socioeconomic History  . Marital status: Widowed    Spouse name: Not on file  . Number of children: Not on file  . Years of education: Not on file  . Highest education level: Not on file  Occupational History  . Not on file  Social Needs  . Financial resource strain: Not hard at all  . Food insecurity:    Worry: Never true    Inability: Never true  . Transportation needs:    Medical: No    Non-medical: No  Tobacco Use  . Smoking status: Current Every Day Smoker    Packs/day: 1.00    Years: 35.00    Pack years: 35.00    Types: Cigarettes    Start date: 09/27/1979  . Smokeless tobacco: Never Used  . Tobacco comment: Has cut back to 1/2 pack  Substance and Sexual Activity  . Alcohol use: No    Alcohol/week: 0.0 standard drinks  . Drug use: No  . Sexual activity: Not Currently  Lifestyle  . Physical activity:    Days per week: Not on file    Minutes per session: Not on file  . Stress: Not on file  Relationships  . Social connections:    Talks  on phone: Not on file    Gets together: Not on file    Attends religious service: Not on file    Active member of club or organization: Not on file    Attends meetings of clubs or organizations: Not on file    Relationship status: Not on file  Other Topics Concern  . Not on file  Social History Narrative  . Not on file   Additional History:     Assessment:   Musculoskeletal: Strength & Muscle Tone: within normal limits Gait & Station: normal, Patient does take small steps Patient leans: N/A  Psychiatric Specialty Exam: Medication Refill     Review of Systems  Skin: Positive for itching.  Psychiatric/Behavioral: Negative for depression, hallucinations, memory loss, substance abuse and suicidal ideas. The patient is not nervous/anxious and does not have insomnia (States she sleeps about 7 hours with the Vistaril. Hypersomnia in the daytime  as noted above.).   All other systems reviewed and are negative.   Blood pressure 106/69, pulse 78, temperature 98.2 F (36.8 C), temperature source Oral, weight 138 lb 6.4 oz (62.8 kg).Body mass index is 26.15 kg/m.  General Appearance: Neat and Well Groomed  Eye Contact:  Good  Speech:  Normal Rate  Volume:  Normal  Mood:  Good  Affect:  Congruent  Thought Process:  Linear and Logical  Orientation:  Full (Time, Place, and Person)  Thought Content:  Negative  Suicidal Thoughts:  No  Homicidal Thoughts:  No  Memory:  Immediate;   Good Recent;   Good Remote;   Good  Judgement:  Good  Insight:  Good  Psychomotor Activity:  Negative  Concentration:  Good  Recall:  Good  Fund of Knowledge: Good  Language: Good  Akathisia:  Negative  Handed:  Right unknown   AIMS (if indicated):  Done on 01/14/15 normal  Assets:  Communication Skills Desire for Improvement Social Support  ADL's:  Intact  Cognition: WNL  Sleep:  good   Is the patient at risk to self?  No. Has the patient been a risk to self in the past 6 months?  No. Has the patient been a risk to self within the distant past?  No. Is the patient a risk to others?  No. Has the patient been a risk to others in the past 6 months?  No. Has the patient been a risk to others within the distant past?  No.  Current Medications: Current Outpatient Medications  Medication Sig Dispense Refill  . ALPRAZolam (XANAX) 0.25 MG tablet Take 1 tablet (0.25 mg total) by mouth at bedtime as needed for anxiety. 30 tablet 0  . ARIPiprazole (ABILIFY) 2 MG tablet Take 1 tablet (2 mg total) by mouth daily. 30 tablet 2  . busPIRone (BUSPAR) 10 MG tablet Take 1 tablet (10 mg total) by mouth 3 (three) times daily. 90 tablet 3  . Calcium Carbonate-Vitamin D 600-400 MG-UNIT per tablet Take by mouth.     No current facility-administered medications for this visit.     Medical Decision Making:  Established Problem, Stable/Improving (1)  Treatment  Plan Summary:Medication management and Plan    Abilify 2 mg at bedtime.     BuSpar 10 mg TID. She was given instructions about the medications. Start trazodone 50 mg at bedtime to help her insomnia  .   We discussed with her about the side effects of the medication in detail and she agreed with the plan.   Patient will follow up in  3 months.    More than 50% of the time spent in psychoeducation, counseling and coordination of care.    This note was generated in part or whole with voice recognition software. Voice regonition is usually quite accurate but there are transcription errors that can and very often do occur. I apologize for any typographical errors that were not detected and corrected.   Brandy Hale, MD  10/04/2017, 9:34 AM

## 2018-01-01 ENCOUNTER — Other Ambulatory Visit: Payer: Self-pay | Admitting: Psychiatry

## 2018-01-03 ENCOUNTER — Encounter: Payer: Self-pay | Admitting: Psychiatry

## 2018-01-03 ENCOUNTER — Other Ambulatory Visit: Payer: Self-pay

## 2018-01-03 ENCOUNTER — Ambulatory Visit: Payer: Medicare Other | Admitting: Psychiatry

## 2018-01-03 VITALS — BP 116/73 | HR 80 | Temp 98.1°F | Wt 138.6 lb

## 2018-01-03 DIAGNOSIS — F411 Generalized anxiety disorder: Secondary | ICD-10-CM | POA: Diagnosis not present

## 2018-01-03 DIAGNOSIS — F3131 Bipolar disorder, current episode depressed, mild: Secondary | ICD-10-CM | POA: Diagnosis not present

## 2018-01-03 MED ORDER — ARIPIPRAZOLE 2 MG PO TABS: 2.0000 mg | ORAL_TABLET | Freq: Every day | ORAL | 2 refills | Status: DC

## 2018-01-03 NOTE — Progress Notes (Signed)
BH MD/PA/NP OP Progress Note  01/03/2018 10:12 AM Adriana Vasquez  MRN:  [id number]  Subjective:  Patient is a 72 year old female with history of bipolar disorder who presented for the follow-up appointment.  She appeared well-groomed as usual.Pt reported that she been having weakness in her legs which is something new.  She reported that she started feeling the weakness in her legs for the past 6 weeks.  She reported that she feels tired after walking short distance and she has made an appointment with her primary care physician for next week.  She stated that she does not have any pain and she was able to describe the weakness.  She reported that she does not feel dizzy or having any muscular tenderness.  Patient reported that she is unable to sleep on her right side as well.  Patient appeared apprehensive during the interview.  Patient reported that she does not take any medications except those prescribed for her.  Her current medications are helping her and she has been doing well.  She currently denied having any side effects and stated that she has been compliant.   Patient has been busy decorating her house for the Christmas and new year as usual.  Her friend helped her and putting up the decorations.  She appeared well groomed as usual.  She denied having any suicidal homicidal ideations or plans.  No acute symptoms noted at this time.       Patient was able to communicate well.     Patient appears calm and alert during the interview.  She currently denied having any suicidal homicidal ideations or plans.       Chief Complaint:  Chief Complaint    Follow-up; Medication Refill     Visit Diagnosis:     ICD-10-CM   1. Bipolar affective disorder, currently depressed, mild (HCC) F31.31   2. Anxiety state F41.1     Past Medical History:  Past Medical History:  Diagnosis Date  . Bipolar disorder (HCC)   . Personal history of tobacco use, presenting hazards to health 01/22/2015     Past Surgical History:  Procedure Laterality Date  . ABDOMINAL HYSTERECTOMY    . HAND SURGERY Right 2001   Patient not sure of what exactly was done.  Repared index finger knuckle area.   Family History:  Family History  Problem Relation Age of Onset  . Arthritis Mother   . Heart attack Father   . Diabetes Father   . Breast cancer Neg Hx    Social History:  Social History   Socioeconomic History  . Marital status: Widowed    Spouse name: Not on file  . Number of children: Not on file  . Years of education: Not on file  . Highest education level: Not on file  Occupational History  . Not on file  Social Needs  . Financial resource strain: Not hard at all  . Food insecurity:    Worry: Never true    Inability: Never true  . Transportation needs:    Medical: No    Non-medical: No  Tobacco Use  . Smoking status: Current Every Day Smoker    Packs/day: 1.00    Years: 35.00    Pack years: 35.00    Types: Cigarettes    Start date: 09/27/1979  . Smokeless tobacco: Never Used  . Tobacco comment: Has cut back to 1/2 pack  Substance and Sexual Activity  . Alcohol use: No    Alcohol/week: 0.0 standard  drinks  . Drug use: No  . Sexual activity: Not Currently  Lifestyle  . Physical activity:    Days per week: Not on file    Minutes per session: Not on file  . Stress: Not on file  Relationships  . Social connections:    Talks on phone: Not on file    Gets together: Not on file    Attends religious service: Not on file    Active member of club or organization: Not on file    Attends meetings of clubs or organizations: Not on file    Relationship status: Not on file  Other Topics Concern  . Not on file  Social History Narrative  . Not on file   Additional History:     Assessment:   Musculoskeletal: Strength & Muscle Tone: within normal limits Gait & Station: normal, Patient does take small steps Patient leans: N/A  Psychiatric Specialty Exam: Medication  Refill     Review of Systems  Skin: Positive for itching.  Psychiatric/Behavioral: Negative for depression, hallucinations, memory loss, substance abuse and suicidal ideas. The patient is not nervous/anxious and does not have insomnia (States she sleeps about 7 hours with the Vistaril. Hypersomnia in the daytime as noted above.).   All other systems reviewed and are negative.   Blood pressure 116/73, pulse 80, temperature 98.1 F (36.7 C), temperature source Oral, weight 138 lb 9.6 oz (62.9 kg).Body mass index is 26.19 kg/m.  General Appearance: Neat and Well Groomed  Eye Contact:  Good  Speech:  Normal Rate  Volume:  Normal  Mood:  Good  Affect:  Congruent  Thought Process:  Linear and Logical  Orientation:  Full (Time, Place, and Person)  Thought Content:  Negative  Suicidal Thoughts:  No  Homicidal Thoughts:  No  Memory:  Immediate;   Good Recent;   Good Remote;   Good  Judgement:  Good  Insight:  Good  Psychomotor Activity:  Negative  Concentration:  Good  Recall:  Good  Fund of Knowledge: Good  Language: Good  Akathisia:  Negative  Handed:  Right unknown   AIMS (if indicated):  Done on 01/14/15 normal  Assets:  Communication Skills Desire for Improvement Social Support  ADL's:  Intact  Cognition: WNL  Sleep:  good   Is the patient at risk to self?  No. Has the patient been a risk to self in the past 6 months?  No. Has the patient been a risk to self within the distant past?  No. Is the patient a risk to others?  No. Has the patient been a risk to others in the past 6 months?  No. Has the patient been a risk to others within the distant past?  No.  Current Medications: Current Outpatient Medications  Medication Sig Dispense Refill  . ARIPiprazole (ABILIFY) 2 MG tablet Take 1 tablet (2 mg total) by mouth daily. 30 tablet 2  . busPIRone (BUSPAR) 10 MG tablet Take 1 tablet (10 mg total) by mouth 3 (three) times daily. 90 tablet 3  . Calcium Carbonate-Vitamin D  600-400 MG-UNIT per tablet Take by mouth.    . traZODone (DESYREL) 50 MG tablet TAKE 1 TABLET BY MOUTH AT BEDTIME 90 tablet 2  . traZODone (DESYREL) 50 MG tablet TAKE 1 TABLET BY MOUTH AT BEDTIME 30 tablet 0   No current facility-administered medications for this visit.     Medical Decision Making:  Established Problem, Stable/Improving (1)  Treatment Plan Summary:Medication management and Plan  Abilify 2 mg at bedtime.     BuSpar 10 mg TID. She was given instructions about the medications. Start trazodone 50 mg at bedtime to help her insomnia  We discussed with her about the side effects of the medication in detail and she agreed with the plan.   Patient will follow up in  3 months.    More than 50% of the time spent in psychoeducation, counseling and coordination of care.    This note was generated in part or whole with voice recognition software. Voice regonition is usually quite accurate but there are transcription errors that can and very often do occur. I apologize for any typographical errors that were not detected and corrected.   Brandy Hale, MD  01/03/2018, 10:12 AM

## 2018-02-01 ENCOUNTER — Other Ambulatory Visit: Payer: Self-pay | Admitting: Internal Medicine

## 2018-02-01 DIAGNOSIS — Z1231 Encounter for screening mammogram for malignant neoplasm of breast: Secondary | ICD-10-CM

## 2018-03-28 ENCOUNTER — Ambulatory Visit
Admission: RE | Admit: 2018-03-28 | Discharge: 2018-03-28 | Disposition: A | Discharge status: Home / Self Care | Payer: Medicare Other | Source: Ambulatory Visit | Attending: Internal Medicine | Admitting: Internal Medicine

## 2018-03-28 DIAGNOSIS — Z1231 Encounter for screening mammogram for malignant neoplasm of breast: Secondary | ICD-10-CM

## 2018-04-01 ENCOUNTER — Other Ambulatory Visit: Payer: Self-pay | Admitting: Psychiatry

## 2018-04-04 ENCOUNTER — Ambulatory Visit: Payer: Medicare Other | Admitting: Psychiatry

## 2018-04-04 ENCOUNTER — Other Ambulatory Visit: Payer: Self-pay

## 2018-04-04 ENCOUNTER — Encounter: Payer: Self-pay | Admitting: Psychiatry

## 2018-04-04 VITALS — BP 96/59 | HR 67 | Temp 98.3°F | Wt 138.6 lb

## 2018-04-04 DIAGNOSIS — F411 Generalized anxiety disorder: Secondary | ICD-10-CM

## 2018-04-04 DIAGNOSIS — F3131 Bipolar disorder, current episode depressed, mild: Secondary | ICD-10-CM

## 2018-04-04 MED ORDER — TRAZODONE HCL 50 MG PO TABS: 50.0000 mg | ORAL_TABLET | Freq: Every day | ORAL | 2 refills | Status: DC

## 2018-04-04 MED ORDER — BUSPIRONE HCL 10 MG PO TABS: 10.0000 mg | ORAL_TABLET | Freq: Three times a day (TID) | ORAL | 3 refills | Status: DC

## 2018-04-04 MED ORDER — ARIPIPRAZOLE 2 MG PO TABS: 2.0000 mg | ORAL_TABLET | Freq: Every day | ORAL | 2 refills | Status: DC

## 2018-04-04 NOTE — Progress Notes (Signed)
BH MD/PA/NP OP Progress Note  04/04/2018 10:50 AM Adriana FinlayShirley C Vasquez  MRN:  [id number]  Subjective:  Patient is a 73 year old female with history of bipolar disorder who presented for the follow-up appointment.  She appeared well-groomed as usual.Pt reported that she was diagnosed with bronchitis approximately a month ago and was started on prednisone.  She reported that she was taking the medication but it was not helpful.  She was then switched to the antibiotic as her symptoms were not improving.  She reported that she has completed the course recently.  She reported that she might have been infected from her friend who was sick during the holidays.  Patient reported that she she is preparing for the Valentine's Day and has decorated her house.  Patient reported that she feels happy for the holidays and enjoys decorating her house.  Patient reported that she is also looking forward for the Easter.  She currently denied having any side effects of the medications.  She stated that she sleeps well at night.  She appeared calm and alert as usual.  No acute issues noted at this time.    Patient was able to communicate well.          Chief Complaint:  Chief Complaint    Follow-up; Medication Refill     Visit Diagnosis:     ICD-10-CM   1. Bipolar affective disorder, currently depressed, mild (HCC) F31.31   2. Anxiety state F41.1     Past Medical History:  Past Medical History:  Diagnosis Date  . Bipolar disorder (HCC)   . Personal history of tobacco use, presenting hazards to health 01/22/2015    Past Surgical History:  Procedure Laterality Date  . ABDOMINAL HYSTERECTOMY    . HAND SURGERY Right 2001   Patient not sure of what exactly was done.  Repared index finger knuckle area.   Family History:  Family History  Problem Relation Age of Onset  . Arthritis Mother   . Heart attack Father   . Diabetes Father   . Breast cancer Neg Hx    Social History:  Social History    Socioeconomic History  . Marital status: Widowed    Spouse name: Not on file  . Number of children: Not on file  . Years of education: Not on file  . Highest education level: Not on file  Occupational History  . Not on file  Social Needs  . Financial resource strain: Not hard at all  . Food insecurity:    Worry: Never true    Inability: Never true  . Transportation needs:    Medical: No    Non-medical: No  Tobacco Use  . Smoking status: Current Every Day Smoker    Packs/day: 1.00    Years: 35.00    Pack years: 35.00    Types: Cigarettes    Start date: 09/27/1979  . Smokeless tobacco: Never Used  . Tobacco comment: Has cut back to 1/2 pack  Substance and Sexual Activity  . Alcohol use: No    Alcohol/week: 0.0 standard drinks  . Drug use: No  . Sexual activity: Not Currently  Lifestyle  . Physical activity:    Days per week: Not on file    Minutes per session: Not on file  . Stress: Not on file  Relationships  . Social connections:    Talks on phone: Not on file    Gets together: Not on file    Attends religious service: Not on file  Active member of club or organization: Not on file    Attends meetings of clubs or organizations: Not on file    Relationship status: Not on file  Other Topics Concern  . Not on file  Social History Narrative  . Not on file   Additional History:     Assessment:   Musculoskeletal: Strength & Muscle Tone: within normal limits Gait & Station: normal, Patient does take small steps Patient leans: N/A  Psychiatric Specialty Exam: Medication Refill     Review of Systems  Skin: Positive for itching.  Psychiatric/Behavioral: Negative for depression, hallucinations, memory loss, substance abuse and suicidal ideas. The patient is not nervous/anxious and does not have insomnia (States she sleeps about 7 hours with the Vistaril. Hypersomnia in the daytime as noted above.).   All other systems reviewed and are negative.   Blood  pressure (!) 96/59, pulse 67, temperature 98.3 F (36.8 C), temperature source Oral, weight 138 lb 9.6 oz (62.9 kg).Body mass index is 26.19 kg/m.  General Appearance: Neat and Well Groomed  Eye Contact:  Good  Speech:  Normal Rate  Volume:  Normal  Mood:  Good  Affect:  Congruent  Thought Process:  Linear and Logical  Orientation:  Full (Time, Place, and Person)  Thought Content:  Negative  Suicidal Thoughts:  No  Homicidal Thoughts:  No  Memory:  Immediate;   Good Recent;   Good Remote;   Good  Judgement:  Good  Insight:  Good  Psychomotor Activity:  Negative  Concentration:  Good  Recall:  Good  Fund of Knowledge: Good  Language: Good  Akathisia:  Negative  Handed:  Right unknown   AIMS (if indicated):  Done on 01/14/15 normal  Assets:  Communication Skills Desire for Improvement Social Support  ADL's:  Intact  Cognition: WNL  Sleep:  good   Is the patient at risk to self?  No. Has the patient been a risk to self in the past 6 months?  No. Has the patient been a risk to self within the distant past?  No. Is the patient a risk to others?  No. Has the patient been a risk to others in the past 6 months?  No. Has the patient been a risk to others within the distant past?  No.  Current Medications: Current Outpatient Medications  Medication Sig Dispense Refill  . ARIPiprazole (ABILIFY) 2 MG tablet Take 1 tablet (2 mg total) by mouth daily. 90 tablet 2  . busPIRone (BUSPAR) 10 MG tablet Take 1 tablet (10 mg total) by mouth 3 (three) times daily. 90 tablet 3  . Calcium Carbonate-Vitamin D 600-400 MG-UNIT per tablet Take by mouth.    . traZODone (DESYREL) 50 MG tablet Take 1 tablet (50 mg total) by mouth at bedtime. 90 tablet 2   No current facility-administered medications for this visit.     Medical Decision Making:  Established Problem, Stable/Improving (1)  Treatment Plan Summary:Medication management and Plan    Abilify 2 mg at bedtime.     BuSpar 10 mg TID.  She was given instructions about the medications. Trazodone 50 mg at bedtime to help her insomnia  We discussed with her about the side effects of the medication in detail and she agreed with the plan.   Patient will follow up in  3 months.    More than 50% of the time spent in psychoeducation, counseling and coordination of care.    This note was generated in part or whole with  voice recognition software. Voice regonition is usually quite accurate but there are transcription errors that can and very often do occur. I apologize for any typographical errors that were not detected and corrected.   Brandy Hale, MD  04/04/2018, 10:50 AM

## 2018-07-04 ENCOUNTER — Encounter: Payer: Self-pay | Admitting: Psychiatry

## 2018-07-04 ENCOUNTER — Ambulatory Visit (INDEPENDENT_AMBULATORY_CARE_PROVIDER_SITE_OTHER): Payer: Medicare Other | Admitting: Psychiatry

## 2018-07-04 ENCOUNTER — Other Ambulatory Visit: Payer: Self-pay

## 2018-07-04 DIAGNOSIS — F3131 Bipolar disorder, current episode depressed, mild: Secondary | ICD-10-CM | POA: Diagnosis not present

## 2018-07-04 DIAGNOSIS — F411 Generalized anxiety disorder: Secondary | ICD-10-CM

## 2018-07-04 MED ORDER — BUSPIRONE HCL 10 MG PO TABS: 10.0000 mg | ORAL_TABLET | Freq: Three times a day (TID) | ORAL | 3 refills | Status: DC

## 2018-07-04 MED ORDER — TRAZODONE HCL 50 MG PO TABS: 50.0000 mg | ORAL_TABLET | Freq: Every day | ORAL | 2 refills | Status: DC

## 2018-07-04 MED ORDER — ARIPIPRAZOLE 2 MG PO TABS: 2.0000 mg | ORAL_TABLET | Freq: Every day | ORAL | 2 refills | Status: DC

## 2018-07-04 NOTE — Progress Notes (Signed)
Patient ID: Adriana Vasquez, female   DOB: 21-Jan-1946, 73 y.o.   MRN: [id number]   Patient is a 73 year old female with history of bipolar and anxiety seen for follow-up. She reported that she has been staying home due to Covid. She reported that she has been compliant with her medications and just refilled them recently. She has occasionally gone out with her friends to the farmers market. She appeared calm and alert during the interview. She reported that she has been sleeping approximately seven hours with the help of the trazodone and the medications are helping her. No acute symptoms noted. She is compliant with her medications. She denied having any suicidal or homicidal ideations or plans.   Plan Refilled her medications. Follow up in three months earlier depending on her symptoms.  I discussed the assessment and treatment plan with the patient. The patient was provided an opportunity to ask questions and all were answered. The patient agreed with the plan and demonstrated an understanding of the instructions.   The patient was advised to call back or seek an in-person evaluation if the symptoms worsen or if the condition fails to improve as anticipated.   I provided 15 minutes of -face-to-face time during this encounter.

## 2018-10-05 ENCOUNTER — Telehealth: Payer: Self-pay | Admitting: *Deleted

## 2018-10-05 DIAGNOSIS — Z122 Encounter for screening for malignant neoplasm of respiratory organs: Secondary | ICD-10-CM

## 2018-10-05 DIAGNOSIS — Z87891 Personal history of nicotine dependence: Secondary | ICD-10-CM

## 2018-10-05 NOTE — Telephone Encounter (Signed)
Left message for patient to notify them that it is time to schedule annual low dose lung cancer screening CT scan. Instructed patient to call back to verify information prior to the scan being scheduled.  

## 2018-10-05 NOTE — Telephone Encounter (Signed)
Patient has been notified that annual lung cancer screening low dose CT scan is due currently or will be in near future. Confirmed that patient is within the age range of 55-77, and asymptomatic, (no signs or symptoms of lung cancer). Patient denies illness that would prevent curative treatment for lung cancer if found. Verified smoking history, (former, quit 03/26/18, 36.5 pack year). The shared decision making visit was done 01/25/15. Patient is agreeable for CT scan being scheduled.

## 2018-10-10 ENCOUNTER — Ambulatory Visit: Payer: Medicare Other | Admitting: Psychiatry

## 2018-10-17 ENCOUNTER — Other Ambulatory Visit: Payer: Self-pay

## 2018-10-17 ENCOUNTER — Encounter: Payer: Self-pay | Admitting: Psychiatry

## 2018-10-17 ENCOUNTER — Ambulatory Visit (INDEPENDENT_AMBULATORY_CARE_PROVIDER_SITE_OTHER): Payer: Medicare Other | Admitting: Psychiatry

## 2018-10-17 DIAGNOSIS — F3131 Bipolar disorder, current episode depressed, mild: Secondary | ICD-10-CM | POA: Diagnosis not present

## 2018-10-17 DIAGNOSIS — F411 Generalized anxiety disorder: Secondary | ICD-10-CM

## 2018-10-17 MED ORDER — BUSPIRONE HCL 10 MG PO TABS: 10.0000 mg | ORAL_TABLET | Freq: Three times a day (TID) | ORAL | 3 refills | Status: DC

## 2018-10-17 MED ORDER — ARIPIPRAZOLE 2 MG PO TABS: 2.0000 mg | ORAL_TABLET | Freq: Every day | ORAL | 2 refills | Status: DC

## 2018-10-17 MED ORDER — TRAZODONE HCL 50 MG PO TABS: 50.0000 mg | ORAL_TABLET | Freq: Every day | ORAL | 2 refills | Status: DC

## 2018-10-17 NOTE — Progress Notes (Signed)
Patient ID: Adriana Vasquez, female   DOB: October 07, 1945, 73 y.o.   MRN: [id number]    Patient is a 73 year old female with history of bipolar disorder who was followed up for medication management. She reported that she has been doing well and has been compliant with her medications. She was talking in detail about birthday party with her friends, She went out to celebrate and she reported that she has been trying to stay safe and compliant with her medications. She appeared happy and calm as usual. She reported that she sleeps well with the help of trazodone. She reported that her anxiety is under control. Patient reported that she had some tendinitis and she went for evaluation and is going for physical therapy. She has been managing herself and taking good care of her. Patient denied having any suicidal or homicidal ideation or plans.  Plan Continue medications as prescribed. She stable on the current combination of the medications. Follow-up in three months earlier depending on her symptoms    I connected with patient via telemedicine application and verified that I am speaking with the correct person using two identifiers.  I discussed the limitations of evaluation and management by telemedicine and the availability of in person appointments. The patient expressed understanding and agreed to proceed.   I discussed the assessment and treatment plan with the patient. The patient was provided an opportunity to ask questions and all were answered. The patient agreed with the plan and demonstrated an understanding of the instructions.   The patient was advised to call back or seek an in-person evaluation if the symptoms worsen or if the condition fails to improve as anticipated.   I provided 15 minutes of non-face-to-face time during this encounter.

## 2018-10-27 ENCOUNTER — Other Ambulatory Visit: Payer: Self-pay

## 2018-10-27 ENCOUNTER — Ambulatory Visit
Admission: RE | Admit: 2018-10-27 | Discharge: 2018-10-27 | Disposition: A | Discharge status: Home / Self Care | Payer: Medicare Other | Source: Ambulatory Visit | Attending: Oncology | Admitting: Oncology

## 2018-10-27 DIAGNOSIS — Z122 Encounter for screening for malignant neoplasm of respiratory organs: Secondary | ICD-10-CM

## 2018-10-27 DIAGNOSIS — Z87891 Personal history of nicotine dependence: Secondary | ICD-10-CM

## 2018-11-01 ENCOUNTER — Encounter: Payer: Self-pay | Admitting: *Deleted

## 2018-12-27 ENCOUNTER — Other Ambulatory Visit: Payer: Self-pay

## 2018-12-27 ENCOUNTER — Encounter: Payer: Self-pay | Admitting: Psychiatry

## 2018-12-27 ENCOUNTER — Ambulatory Visit (INDEPENDENT_AMBULATORY_CARE_PROVIDER_SITE_OTHER): Payer: Medicare Other | Admitting: Psychiatry

## 2018-12-27 DIAGNOSIS — F411 Generalized anxiety disorder: Secondary | ICD-10-CM

## 2018-12-27 DIAGNOSIS — F319 Bipolar disorder, unspecified: Secondary | ICD-10-CM

## 2018-12-27 NOTE — Progress Notes (Signed)
[place] MD OP Progress Note  I connected with  Adriana Vasquez on 12/27/18 by a video enabled telemedicine application and verified that I am speaking with the correct person using two identifiers.   I discussed the limitations of evaluation and management by telemedicine. The patient expressed understanding and agreed to proceed.   12/27/2018 10:13 AM Adriana Vasquez  MRN:  [id number]  Chief Complaint:  " I am doing okay."  HPI: Patient reported doing well on her current combination of medications.  She stated that she is able to sleep well for the most.  She reported feeling stressed due to the ongoing pandemic.  She is very cautious and avoids going out much wash her hands frequently and wears her mask.  She stated that she lives alone and she does not have a lot of family.  She tries to keep herself busy.  She denied any side effects to her regimen.  Visit Diagnosis:    ICD-10-CM   1. Bipolar I disorder ([place])  F31.9   2. Anxiety state  F41.1     Past Psychiatric History: Bipolar disorder, anxiety  Past Medical History:  Past Medical History:  Diagnosis Date  . Bipolar disorder ([place])   . Personal history of tobacco use, presenting hazards to health 01/22/2015    Past Surgical History:  Procedure Laterality Date  . ABDOMINAL HYSTERECTOMY    . HAND SURGERY Right 2001   Patient not sure of what exactly was done.  Repared index finger knuckle area.    Family Psychiatric History: Anxiety in parents  Family History:  Family History  Problem Relation Age of Onset  . Arthritis Mother   . Heart attack Father   . Diabetes Father   . Breast cancer Neg Hx     Social History:  Social History   Socioeconomic History  . Marital status: Widowed    Spouse name: Not on file  . Number of children: Not on file  . Years of education: Not on file  . Highest education level: Not on file  Occupational History  . Not on file  Social Needs  . Financial resource strain: Not hard at all   . Food insecurity    Worry: Never true    Inability: Never true  . Transportation needs    Medical: No    Non-medical: No  Tobacco Use  . Smoking status: Current Every Day Smoker    Packs/day: 1.00    Years: 35.00    Pack years: 35.00    Types: Cigarettes    Start date: 09/27/1979  . Smokeless tobacco: Never Used  . Tobacco comment: Has cut back to 1/2 pack  Substance and Sexual Activity  . Alcohol use: No    Alcohol/week: 0.0 standard drinks  . Drug use: No  . Sexual activity: Not Currently  Lifestyle  . Physical activity    Days per week: Not on file    Minutes per session: Not on file  . Stress: Not on file  Relationships  . Social Herbalist on phone: Not on file    Gets together: Not on file    Attends religious service: Not on file    Active member of club or organization: Not on file    Attends meetings of clubs or organizations: Not on file    Relationship status: Not on file  Other Topics Concern  . Not on file  Social History Narrative  . Not on file  Allergies: No Known Allergies  Metabolic Disorder Labs: No results found for: HGBA1C, MPG Lab Results  Component Value Date   PROLACTIN 26.7 (H) 02/07/2015   PROLACTIN 46.5 (H) 12/07/2014   Lab Results  Component Value Date   CHOL 218 (H) 12/07/2014   TRIG 153 (H) 12/07/2014   HDL 60 12/07/2014   LDLCALC 127 (H) 12/07/2014   No results found for: TSH  Therapeutic Level Labs: No results found for: LITHIUM No results found for: VALPROATE No components found for:  CBMZ  Current Medications: Current Outpatient Medications  Medication Sig Dispense Refill  . ARIPiprazole (ABILIFY) 2 MG tablet Take 1 tablet (2 mg total) by mouth daily. 90 tablet 2  . busPIRone (BUSPAR) 10 MG tablet Take 1 tablet (10 mg total) by mouth 3 (three) times daily. 90 tablet 3  . Calcium Carbonate-Vitamin D 600-400 MG-UNIT per tablet Take by mouth.    . traZODone (DESYREL) 50 MG tablet Take 1 tablet (50 mg  total) by mouth at bedtime. 90 tablet 2   No current facility-administered medications for this visit.      Musculoskeletal: Strength & Muscle Tone: unable to assess due to telemed visit Gait & Station: unable to assess due to telemed visit Patient leans: unable to assess due to telemed visit  Psychiatric Specialty Exam: ROS  There were no vitals taken for this visit.There is no height or weight on file to calculate BMI.  General Appearance: Well Groomed  Eye Contact:  Good  Speech:  Clear and Coherent and Normal Rate  Volume:  Normal  Mood:  Euthymic  Affect:  Congruent  Thought Process:  Goal Directed, Linear and Descriptions of Associations: Intact  Orientation:  Full (Time, Place, and Person)  Thought Content: Logical   Suicidal Thoughts:  No  Homicidal Thoughts:  No  Memory:  Recent;   Good Remote;   Good  Judgement:  Fair  Insight:  Fair  Psychomotor Activity:  Normal  Concentration:  Concentration: Good and Attention Span: Good  Recall:  Good  Fund of Knowledge: Good  Language: Good  Akathisia:  Negative  Handed:  Right  AIMS (if indicated): not done  Assets:  Communication Skills Desire for Improvement Financial Resources/Insurance Housing  ADL's:  Intact  Cognition: WNL  Sleep:  Good   Screenings: AIMS     Office Visit from 10/04/2017 in [facility] Office Visit from 12/14/2016 in [facility] Office Visit from 06/19/2015 in [facility]  AIMS Total Score  0  0  0       Assessment and Plan: 73 year old female currently stable on her regimen, reported some anxiety and stress due to ongoing pandemic and its effects. Continue the same regimen as below. Abilify 2 mg daily Trazodone 50 mg at bedtime BuSpar 10 mg 3 times daily  Follow-up in 3 months.   Adriana Amos, MD 12/27/2018, 10:13 AM

## 2019-01-09 ENCOUNTER — Ambulatory Visit: Payer: Medicare Other | Admitting: Psychiatry

## 2019-03-01 ENCOUNTER — Other Ambulatory Visit: Payer: Self-pay | Admitting: Psychiatry

## 2019-03-02 ENCOUNTER — Telehealth: Payer: Self-pay

## 2019-03-02 MED ORDER — BUSPIRONE HCL 10 MG PO TABS: 10.0000 mg | ORAL_TABLET | Freq: Three times a day (TID) | ORAL | 0 refills | Status: DC

## 2019-03-02 NOTE — Telephone Encounter (Signed)
Prescription sent for Buspar

## 2019-03-02 NOTE — Telephone Encounter (Signed)
pt called left message that she needs a refill on her medication buspar.

## 2019-03-02 NOTE — Telephone Encounter (Signed)
Dr.Kaur's patient 

## 2019-03-22 ENCOUNTER — Encounter: Payer: Self-pay | Admitting: Psychiatry

## 2019-03-22 ENCOUNTER — Ambulatory Visit (INDEPENDENT_AMBULATORY_CARE_PROVIDER_SITE_OTHER): Payer: Medicare PPO | Admitting: Psychiatry

## 2019-03-22 ENCOUNTER — Other Ambulatory Visit: Payer: Self-pay

## 2019-03-22 DIAGNOSIS — G4700 Insomnia, unspecified: Secondary | ICD-10-CM | POA: Diagnosis not present

## 2019-03-22 DIAGNOSIS — F3176 Bipolar disorder, in full remission, most recent episode depressed: Secondary | ICD-10-CM | POA: Diagnosis not present

## 2019-03-22 DIAGNOSIS — F419 Anxiety disorder, unspecified: Secondary | ICD-10-CM | POA: Diagnosis not present

## 2019-03-22 DIAGNOSIS — Z9189 Other specified personal risk factors, not elsewhere classified: Secondary | ICD-10-CM

## 2019-03-22 DIAGNOSIS — F5105 Insomnia due to other mental disorder: Secondary | ICD-10-CM | POA: Insufficient documentation
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
## 2019-04-03 ENCOUNTER — Telehealth: Payer: Self-pay

## 2019-04-03 DIAGNOSIS — F419 Anxiety disorder, unspecified: Secondary | ICD-10-CM

## 2019-04-03 DIAGNOSIS — F3176 Bipolar disorder, in full remission, most recent episode depressed: Secondary | ICD-10-CM

## 2019-04-03 MED ORDER — BUSPIRONE HCL 10 MG PO TABS: 10.0000 mg | ORAL_TABLET | Freq: Three times a day (TID) | ORAL | 2 refills | Status: DC

## 2019-04-03 NOTE — Telephone Encounter (Signed)
I have sent BuSpar to her pharmacy. 

## 2019-04-03 NOTE — Telephone Encounter (Signed)
Medication refill request - Fax received from pt's [facility] for a refill of pt's Buspirone 10 mg tablets 3 times a day, last ordered 03/02/19 with no refills. Dr. Elna Breslow saw patient 03/22/19 and continued medication.  Patient does not return until 05/29/19 to see Dr. Elna Breslow again then.

## 2019-04-28 ENCOUNTER — Other Ambulatory Visit: Payer: Self-pay

## 2019-04-28 ENCOUNTER — Ambulatory Visit
Admission: RE | Admit: 2019-04-28 | Discharge: 2019-04-28 | Disposition: A | Discharge status: Home / Self Care | Payer: Medicare PPO | Source: Ambulatory Visit | Attending: Infectious Diseases | Admitting: Infectious Diseases

## 2019-04-28 ENCOUNTER — Other Ambulatory Visit: Payer: Self-pay | Admitting: Infectious Diseases

## 2019-04-28 DIAGNOSIS — M79661 Pain in right lower leg: Secondary | ICD-10-CM

## 2019-04-28 DIAGNOSIS — M7989 Other specified soft tissue disorders: Secondary | ICD-10-CM | POA: Diagnosis present

## 2019-05-04 ENCOUNTER — Other Ambulatory Visit: Payer: Self-pay | Admitting: Infectious Diseases

## 2019-05-29 ENCOUNTER — Encounter: Payer: Self-pay | Admitting: Psychiatry

## 2019-05-29 ENCOUNTER — Other Ambulatory Visit: Payer: Self-pay

## 2019-05-29 ENCOUNTER — Ambulatory Visit (INDEPENDENT_AMBULATORY_CARE_PROVIDER_SITE_OTHER): Payer: Medicare PPO | Admitting: Psychiatry

## 2019-05-29 DIAGNOSIS — G4700 Insomnia, unspecified: Secondary | ICD-10-CM | POA: Diagnosis not present

## 2019-05-29 DIAGNOSIS — Z9189 Other specified personal risk factors, not elsewhere classified: Secondary | ICD-10-CM | POA: Diagnosis not present

## 2019-05-29 DIAGNOSIS — F3176 Bipolar disorder, in full remission, most recent episode depressed: Secondary | ICD-10-CM | POA: Diagnosis not present

## 2019-05-29 DIAGNOSIS — F419 Anxiety disorder, unspecified: Secondary | ICD-10-CM

## 2019-05-29 MED ORDER — BUSPIRONE HCL 10 MG PO TABS: 10.0000 mg | ORAL_TABLET | Freq: Three times a day (TID) | ORAL | 2 refills | Status: DC

## 2019-05-29 NOTE — Progress Notes (Signed)
Provider Location : [facility] Patient Location : Home  Virtual Visit via Video Note  I connected with Adriana Vasquez on 05/29/19 at 11:00 AM EDT by a video enabled telemedicine application and verified that I am speaking with the correct person using two identifiers.   I discussed the limitations of evaluation and management by telemedicine and the availability of in person appointments. The patient expressed understanding and agreed to proceed.     I discussed the assessment and treatment plan with the patient. The patient was provided an opportunity to ask questions and all were answered. The patient agreed with the plan and demonstrated an understanding of the instructions.   The patient was advised to call back or seek an in-person evaluation if the symptoms worsen or if the condition fails to improve as anticipated.   BH MD OP Progress Note  05/29/2019 5:34 PM DANE BLOCH  MRN:  [id number]  Chief Complaint:  Chief Complaint    Follow-up     HPI: Adriana Vasquez is a 74 year old Caucasian female, lives in [place], retired, widowed, has a history of bipolar disorder was evaluated by telemedicine today.  Patient today reports she is currently doing well on the current medication regimen.  She denies any mood symptoms.  She denies any depression or anxiety symptoms.  She reports sleep is good.  She is compliant on medications and denies side effects.  Patient denies any suicidality, homicidality or perceptual disturbances.  Patient reports she was able to get her COVID 19 vaccine for shot.  She denies any other concerns today. Visit Diagnosis:    ICD-10-CM   1. Bipolar disorder, in full remission, most recent episode depressed (HCC)  F31.76   2. Anxiety disorder, unspecified type  F41.9 busPIRone (BUSPAR) 10 MG tablet  3. Insomnia, unspecified type  G47.00   4. At risk for long QT syndrome  Z91.89     Past Psychiatric History: I have reviewed past psychiatric history from  my progress note on 03/22/2019.  Past trials of Geodon, Abilify, trazodone, hydroxyzine, BuSpar  Past Medical History:  Past Medical History:  Diagnosis Date  . Bipolar disorder (HCC)   . Personal history of tobacco use, presenting hazards to health 01/22/2015    Past Surgical History:  Procedure Laterality Date  . ABDOMINAL HYSTERECTOMY    . HAND SURGERY Right 2001   Patient not sure of what exactly was done.  Repared index finger knuckle area.    Family Psychiatric History: I have reviewed family psychiatric history from my progress note on 03/22/2019  Family History:  Family History  Problem Relation Age of Onset  . Arthritis Mother   . Heart attack Father   . Diabetes Father   . Bipolar disorder Father   . Breast cancer Neg Hx     Social History: Reviewed social history from my progress note on 03/22/2019 Social History   Socioeconomic History  . Marital status: Widowed    Spouse name: Not on file  . Number of children: Not on file  . Years of education: Not on file  . Highest education level: Not on file  Occupational History  . Not on file  Tobacco Use  . Smoking status: Former Smoker    Packs/day: 1.00    Years: 35.00    Pack years: 35.00    Types: Cigarettes    Start date: 09/27/1979  . Smokeless tobacco: Never Used  . Tobacco comment: 03/26/2018  Substance and Sexual Activity  . Alcohol use: No  Alcohol/week: 0.0 standard drinks  . Drug use: No  . Sexual activity: Not Currently  Other Topics Concern  . Not on file  Social History Narrative  . Not on file   Social Determinants of Health   Financial Resource Strain:   . Difficulty of Paying Living Expenses:   Food Insecurity:   . Worried About Charity fundraiser in the Last Year:   . Arboriculturist in the Last Year:   Transportation Needs:   . Film/video editor (Medical):   Marland Kitchen Lack of Transportation (Non-Medical):   Physical Activity:   . Days of Exercise per Week:   . Minutes of Exercise  per Session:   Stress:   . Feeling of Stress :   Social Connections:   . Frequency of Communication with Friends and Family:   . Frequency of Social Gatherings with Friends and Family:   . Attends Religious Services:   . Active Member of Clubs or Organizations:   . Attends Archivist Meetings:   Marland Kitchen Marital Status:     Allergies: No Known Allergies  Metabolic Disorder Labs: No results found for: HGBA1C, MPG Lab Results  Component Value Date   PROLACTIN 26.7 (H) 02/07/2015   PROLACTIN 46.5 (H) 12/07/2014   Lab Results  Component Value Date   CHOL 218 (H) 12/07/2014   TRIG 153 (H) 12/07/2014   HDL 60 12/07/2014   LDLCALC 127 (H) 12/07/2014   No results found for: TSH  Therapeutic Level Labs: No results found for: LITHIUM No results found for: VALPROATE No components found for:  CBMZ  Current Medications: Current Outpatient Medications  Medication Sig Dispense Refill  . ARIPiprazole (ABILIFY) 2 MG tablet Take 1 tablet (2 mg total) by mouth daily. 90 tablet 2  . busPIRone (BUSPAR) 10 MG tablet Take 1 tablet (10 mg total) by mouth 3 (three) times daily. 270 tablet 2  . Calcium Carbonate-Vitamin D 600-400 MG-UNIT per tablet Take by mouth.    . traZODone (DESYREL) 50 MG tablet Take 1 tablet (50 mg total) by mouth at bedtime. 90 tablet 2  . chlorhexidine (PERIDEX) 0.12 % solution     . meloxicam (MOBIC) 15 MG tablet      No current facility-administered medications for this visit.     Musculoskeletal: Strength & Muscle Tone: UTA Gait & Station: normal Patient leans: N/A  Psychiatric Specialty Exam: Review of Systems  Psychiatric/Behavioral: Negative for agitation, behavioral problems, confusion, decreased concentration, dysphoric mood, hallucinations, self-injury, sleep disturbance and suicidal ideas. The patient is not nervous/anxious and is not hyperactive.   All other systems reviewed and are negative.   There were no vitals taken for this visit.There is  no height or weight on file to calculate BMI.  General Appearance: Casual  Eye Contact:  Good  Speech:  Normal Rate  Volume:  Normal  Mood:  Euthymic  Affect:  Congruent  Thought Process:  Goal Directed and Descriptions of Associations: Intact  Orientation:  Full (Time, Place, and Person)  Thought Content: Logical   Suicidal Thoughts:  No  Homicidal Thoughts:  No  Memory:  Immediate;   Fair Recent;   Fair Remote;   Fair  Judgement:  Fair  Insight:  Fair  Psychomotor Activity:  Normal  Concentration:  Concentration: Fair and Attention Span: Fair  Recall:  [employer] of Knowledge: Fair  Language: Fair  Akathisia:  No  Handed:  Right  AIMS (if indicated): UTA  Assets:  Armed forces logistics/support/administrative officer  Desire for Improvement Housing Social Support  ADL's:  Intact  Cognition: WNL  Sleep:  Fair   Screenings: AIMS     [facility] Visit from 10/04/2017 in [facility] Office Visit from 12/14/2016 in [facility] Office Visit from 06/19/2015 in [facility]  AIMS Total Score  0  0  0       Assessment and Plan: Adriana Vasquez is a 74 year old Caucasian female, widowed, retired, lives in [place], has a history of bipolar disorder was evaluated by telemedicine today.  Patient is biologically predisposed given her family history of mental health problems.  She does have psychosocial stressors of current pandemic.  Patient is currently doing well on medications.  Plan as noted below.  Plan Bipolar disorder in remission Abilify 2 mg p.o. daily BuSpar 10 mg p.o. 3 times daily  Insomnia-improving Trazodone 50 mg p.o. nightly Melatonin 2 to 3 mg at bedtime as needed. Continue sleep hygiene.  Anxiety disorder-unspecified-stable We will monitor closely.  Patient advised to get EKG to monitor QTC as well as her labs done at her primary care doctor office since she is on Abilify.  She agrees to get it done.  Follow-up in  clinic in 2 -3 months or sooner if needed.  I have spent atleast 20 minutes non face to face with patient today. More than 50 % of the time was spent for preparing to see the patient ( e.g., review of test, records ),  ordering medications and test ,psychoeducation and supportive psychotherapy and care coordination,as well as documenting clinical information in electronic health record. This note was generated in part or whole with voice recognition software. Voice recognition is usually quite accurate but there are transcription errors that can and very often do occur. I apologize for any typographical errors that were not detected and corrected.        Jomarie Longs, MD 05/29/2019, 5:34 PM

## 2019-06-06 ENCOUNTER — Telehealth: Payer: Self-pay | Admitting: Psychiatry

## 2019-06-06 NOTE — Telephone Encounter (Signed)
I have reviewed EKG obtained from Dr. Candie Mile 01 June 2019 QTC within normal limits.

## 2019-06-12 ENCOUNTER — Telehealth: Payer: Self-pay

## 2019-06-12 DIAGNOSIS — F3176 Bipolar disorder, in full remission, most recent episode depressed: Secondary | ICD-10-CM

## 2019-06-12 MED ORDER — ARIPIPRAZOLE 2 MG PO TABS: 2.0000 mg | ORAL_TABLET | Freq: Every day | ORAL | 0 refills | Status: DC

## 2019-06-12 NOTE — Telephone Encounter (Signed)
I have sent Abilify to pharmacy. 

## 2019-06-12 NOTE — Telephone Encounter (Signed)
pt needs refills on abilify sent to [facility]

## 2019-07-31 ENCOUNTER — Other Ambulatory Visit: Payer: Self-pay | Admitting: Infectious Diseases

## 2019-07-31 DIAGNOSIS — Z1231 Encounter for screening mammogram for malignant neoplasm of breast: Secondary | ICD-10-CM

## 2019-08-14 ENCOUNTER — Other Ambulatory Visit: Payer: Self-pay

## 2019-08-14 ENCOUNTER — Telehealth (INDEPENDENT_AMBULATORY_CARE_PROVIDER_SITE_OTHER): Payer: Medicare PPO | Admitting: Psychiatry

## 2019-08-14 ENCOUNTER — Encounter: Payer: Self-pay | Admitting: Psychiatry

## 2019-08-14 DIAGNOSIS — F5105 Insomnia due to other mental disorder: Secondary | ICD-10-CM

## 2019-08-14 DIAGNOSIS — F3176 Bipolar disorder, in full remission, most recent episode depressed: Secondary | ICD-10-CM

## 2019-08-14 DIAGNOSIS — F99 Mental disorder, not otherwise specified: Secondary | ICD-10-CM | POA: Diagnosis not present

## 2019-08-14 MED ORDER — ARIPIPRAZOLE 2 MG PO TABS: 1.0000 mg | ORAL_TABLET | Freq: Every day | ORAL | 0 refills | Status: DC

## 2019-08-14 NOTE — Progress Notes (Signed)
Provider Location : [facility] Patient Location : Home  Virtual Visit via Video Note  I connected with Adriana Vasquez on 08/14/19 at 11:00 AM EDT by a video enabled telemedicine application and verified that I am speaking with the correct person using two identifiers.   I discussed the limitations of evaluation and management by telemedicine and the availability of in person appointments. The patient expressed understanding and agreed to proceed.     I discussed the assessment and treatment plan with the patient. The patient was provided an opportunity to ask questions and all were answered. The patient agreed with the plan and demonstrated an understanding of the instructions.   The patient was advised to call back or seek an in-person evaluation if the symptoms worsen or if the condition fails to improve as anticipated.   BH MD OP Progress Note  08/14/2019 9:12 PM Adriana Vasquez  MRN:  [id number]  Chief Complaint:  Chief Complaint    Follow-up     HPI: Adriana Vasquez is a 74 year old Caucasian female, lives in [place], retired, widowed, has a history of bipolar disorder was evaluated by telemedicine today.  Patient today reports she is currently doing well with regards to her mood symptoms.  She denies any significant mood lability.  She denies any anxiety symptoms.  She reports sleep as good.  She denies any appetite changes.  She denies any side effects to medications and reports she is compliant on them.  She takes the trazodone only as needed.  Patient denies any suicidality, homicidality or perceptual disturbances.  Visit Diagnosis:    ICD-10-CM   1. Bipolar disorder, in full remission, most recent episode depressed (HCC)  F31.76 ARIPiprazole (ABILIFY) 2 MG tablet  2. Insomnia due to other mental disorder  F51.05    F99     Past Psychiatric History: I have reviewed past psychiatric history from my progress note from 03/22/2019.  Past trials of Geodon, Abilify,  trazodone, hydroxyzine, BuSpar  Past Medical History:  Past Medical History:  Diagnosis Date  . Bipolar disorder (HCC)   . Personal history of tobacco use, presenting hazards to health 01/22/2015    Past Surgical History:  Procedure Laterality Date  . ABDOMINAL HYSTERECTOMY    . HAND SURGERY Right 2001   Patient not sure of what exactly was done.  Repared index finger knuckle area.    Family Psychiatric History: I have reviewed family psychiatric history from my progress note on 03/22/2019  Family History:  Family History  Problem Relation Age of Onset  . Arthritis Mother   . Heart attack Father   . Diabetes Father   . Bipolar disorder Father   . Breast cancer Neg Hx     Social History: Reviewed social history from my progress note on 03/22/2019 Social History   Socioeconomic History  . Marital status: Widowed    Spouse name: Not on file  . Number of children: Not on file  . Years of education: Not on file  . Highest education level: Not on file  Occupational History  . Not on file  Tobacco Use  . Smoking status: Former Smoker    Packs/day: 1.00    Years: 35.00    Pack years: 35.00    Types: Cigarettes    Start date: 09/27/1979  . Smokeless tobacco: Never Used  . Tobacco comment: 03/26/2018  Substance and Sexual Activity  . Alcohol use: No    Alcohol/week: 0.0 standard drinks  . Drug use: No  .  Sexual activity: Not Currently  Other Topics Concern  . Not on file  Social History Narrative  . Not on file   Social Determinants of Health   Financial Resource Strain:   . Difficulty of Paying Living Expenses:   Food Insecurity:   . Worried About Charity fundraiser in the Last Year:   . Arboriculturist in the Last Year:   Transportation Needs:   . Film/video editor (Medical):   Marland Kitchen Lack of Transportation (Non-Medical):   Physical Activity:   . Days of Exercise per Week:   . Minutes of Exercise per Session:   Stress:   . Feeling of Stress :   Social  Connections:   . Frequency of Communication with Friends and Family:   . Frequency of Social Gatherings with Friends and Family:   . Attends Religious Services:   . Active Member of Clubs or Organizations:   . Attends Archivist Meetings:   Marland Kitchen Marital Status:     Allergies: No Known Allergies  Metabolic Disorder Labs: No results found for: HGBA1C, MPG Lab Results  Component Value Date   PROLACTIN 26.7 (H) 02/07/2015   PROLACTIN 46.5 (H) 12/07/2014   Lab Results  Component Value Date   CHOL 218 (H) 12/07/2014   TRIG 153 (H) 12/07/2014   HDL 60 12/07/2014   LDLCALC 127 (H) 12/07/2014   No results found for: TSH  Therapeutic Level Labs: No results found for: LITHIUM No results found for: VALPROATE No components found for:  CBMZ  Current Medications: Current Outpatient Medications  Medication Sig Dispense Refill  . ARIPiprazole (ABILIFY) 2 MG tablet Take 0.5-1 tablets (1-2 mg total) by mouth daily. 90 tablet 0  . busPIRone (BUSPAR) 10 MG tablet Take 1 tablet (10 mg total) by mouth 3 (three) times daily. 270 tablet 2  . Calcium Carbonate-Vitamin D 600-400 MG-UNIT per tablet Take by mouth.    . chlorhexidine (PERIDEX) 0.12 % solution     . meloxicam (MOBIC) 15 MG tablet     . traZODone (DESYREL) 50 MG tablet Take 1 tablet (50 mg total) by mouth at bedtime. 90 tablet 2   No current facility-administered medications for this visit.     Musculoskeletal: Strength & Muscle Tone: UTA Gait & Station: normal Patient leans: N/A  Psychiatric Specialty Exam: Review of Systems  Psychiatric/Behavioral: Negative for agitation, behavioral problems, confusion, decreased concentration, dysphoric mood, hallucinations, self-injury, sleep disturbance and suicidal ideas. The patient is not nervous/anxious and is not hyperactive.   All other systems reviewed and are negative.   There were no vitals taken for this visit.There is no height or weight on file to calculate BMI.   General Appearance: Casual  Eye Contact:  Fair  Speech:  Clear and Coherent  Volume:  Normal  Mood:  Euthymic  Affect:  Congruent  Thought Process:  Goal Directed and Descriptions of Associations: Intact  Orientation:  Full (Time, Place, and Person)  Thought Content: Logical   Suicidal Thoughts:  No  Homicidal Thoughts:  No  Memory:  Immediate;   Fair Recent;   Good Remote;   Fair  Judgement:  Fair  Insight:  Fair  Psychomotor Activity:  Normal  Concentration:  Concentration: Fair and Attention Span: Fair  Recall:  [employer] of Knowledge: Fair  Language: Fair  Akathisia:  No  Handed:  Right  AIMS (if indicated): UTA  Assets:  Communication Skills Desire for Improvement Housing Social Support  ADL's:  Intact  Cognition: WNL  Sleep:  Fair   Screenings: AIMS     [facility] Visit from 10/04/2017 in [facility] Office Visit from 12/14/2016 in [facility] Office Visit from 06/19/2015 in [facility]  AIMS Total Score 0 0 0       Assessment and Plan: Adriana Vasquez is a 74 year old Caucasian female, widowed, retired, lives in [place], has a history of bipolar disorder was evaluated by telemedicine today.  Patient is biologically predisposed given her family history of mental health problems.  Patient with psychosocial stressors of current pandemic.  Patient is currently stable on current medication regimen.  Discussed plan as noted below.  Plan Bipolar disorder in remission Taper off Abilify.  Discussed with patient to reduce the Abilify to 1 mg p.o. daily.  If she does not tolerate it well she can go back to Abilify 2 mg p.o. daily.  Discussed with patient the long-term risk of medications like Abilify. BuSpar 10 mg p.o. 3 times daily  Insomnia-stable Trazodone 50 mg p.o. nightly.  She uses it only as needed. Melatonin 2 to 3 mg p.o. nightly as needed Continue sleep hygiene  techniques.  Discussed with patient to get TSH labs done-it was last ordered on 03/22/2019-pending.  Follow-up in clinic in 3 months or sooner if needed.  I have spent atleast 20 minutes non face to face with patient today. More than 50 % of the time was spent for preparing to see the patient ( e.g., review of test, records ),  ordering medications and test ,psychoeducation and supportive psychotherapy and care coordination,as well as documenting clinical information in electronic health record. This note was generated in part or whole with voice recognition software. Voice recognition is usually quite accurate but there are transcription errors that can and very often do occur. I apologize for any typographical errors that were not detected and corrected.        Jomarie Longs, MD 08/14/2019, 9:12 PM

## 2019-08-16 LAB — COLOGUARD: COLOGUARD: NEGATIVE

## 2019-09-08 ENCOUNTER — Ambulatory Visit
Admission: RE | Admit: 2019-09-08 | Discharge: 2019-09-08 | Disposition: A | Discharge status: Home / Self Care | Payer: Medicare PPO | Source: Ambulatory Visit | Attending: Infectious Diseases | Admitting: Infectious Diseases

## 2019-09-08 DIAGNOSIS — Z1231 Encounter for screening mammogram for malignant neoplasm of breast: Secondary | ICD-10-CM | POA: Diagnosis present

## 2019-10-24 ENCOUNTER — Telehealth: Payer: Self-pay

## 2019-10-24 DIAGNOSIS — F3161 Bipolar disorder, current episode mixed, mild: Secondary | ICD-10-CM

## 2019-10-24 MED ORDER — ARIPIPRAZOLE 2 MG PO TABS: 3.0000 mg | ORAL_TABLET | Freq: Every day | ORAL | 0 refills | Status: DC

## 2019-10-24 MED ORDER — TRAZODONE HCL 50 MG PO TABS: 50.0000 mg | ORAL_TABLET | Freq: Every day | ORAL | 1 refills | Status: DC
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
## 2019-11-04 ENCOUNTER — Telehealth: Payer: Self-pay | Admitting: *Deleted

## 2019-11-04 DIAGNOSIS — Z122 Encounter for screening for malignant neoplasm of respiratory organs: Secondary | ICD-10-CM

## 2019-11-04 DIAGNOSIS — Z87891 Personal history of nicotine dependence: Secondary | ICD-10-CM

## 2019-11-04 NOTE — Telephone Encounter (Signed)
Left a voicemail to inform patient that she is now due for her lung cancer screening scan. Instructed Adriana Vasquez to call back to update information so she can get her scan scheduled.

## 2019-11-09 NOTE — Addendum Note (Signed)
Addended by: Jonne Ply on: 11/09/2019 12:03 PM   Modules accepted: Orders

## 2019-11-09 NOTE — Telephone Encounter (Signed)
Contacted and scheduled. Former smoker, quit 03/26/18, 36.5 pack year

## 2019-11-14 ENCOUNTER — Encounter: Payer: Self-pay | Admitting: Psychiatry

## 2019-11-14 ENCOUNTER — Telehealth (INDEPENDENT_AMBULATORY_CARE_PROVIDER_SITE_OTHER): Payer: Medicare PPO | Admitting: Psychiatry

## 2019-11-14 ENCOUNTER — Other Ambulatory Visit: Payer: Self-pay

## 2019-11-14 DIAGNOSIS — F3176 Bipolar disorder, in full remission, most recent episode depressed: Secondary | ICD-10-CM | POA: Diagnosis not present

## 2019-11-14 DIAGNOSIS — F5105 Insomnia due to other mental disorder: Secondary | ICD-10-CM

## 2019-11-14 DIAGNOSIS — F99 Mental disorder, not otherwise specified: Secondary | ICD-10-CM
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
## 2019-11-29 ENCOUNTER — Ambulatory Visit
Admission: RE | Admit: 2019-11-29 | Discharge: 2019-11-29 | Disposition: A | Discharge status: Home / Self Care | Payer: Medicare PPO | Source: Ambulatory Visit | Attending: Nurse Practitioner | Admitting: Nurse Practitioner

## 2019-11-29 ENCOUNTER — Other Ambulatory Visit: Payer: Self-pay

## 2019-11-29 DIAGNOSIS — Z87891 Personal history of nicotine dependence: Secondary | ICD-10-CM

## 2019-11-29 DIAGNOSIS — Z122 Encounter for screening for malignant neoplasm of respiratory organs: Secondary | ICD-10-CM | POA: Diagnosis present

## 2019-12-01 ENCOUNTER — Encounter: Payer: Self-pay | Admitting: *Deleted

## 2020-01-01 ENCOUNTER — Encounter: Payer: Self-pay | Admitting: Psychiatry

## 2020-01-01 ENCOUNTER — Telehealth (INDEPENDENT_AMBULATORY_CARE_PROVIDER_SITE_OTHER): Payer: Medicare PPO | Admitting: Psychiatry

## 2020-01-01 ENCOUNTER — Other Ambulatory Visit: Payer: Self-pay

## 2020-01-01 DIAGNOSIS — F3176 Bipolar disorder, in full remission, most recent episode depressed: Secondary | ICD-10-CM | POA: Diagnosis not present

## 2020-01-01 DIAGNOSIS — F5105 Insomnia due to other mental disorder: Secondary | ICD-10-CM

## 2020-01-01 MED ORDER — ARIPIPRAZOLE 2 MG PO TABS: 3.0000 mg | ORAL_TABLET | Freq: Every day | ORAL | 1 refills | Status: DC

## 2020-01-01 MED ORDER — BUSPIRONE HCL 10 MG PO TABS: 10.0000 mg | ORAL_TABLET | Freq: Three times a day (TID) | ORAL | 2 refills | Status: DC
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
## 2020-04-09 ENCOUNTER — Encounter: Payer: Self-pay | Admitting: Psychiatry

## 2020-04-09 ENCOUNTER — Other Ambulatory Visit: Payer: Self-pay

## 2020-04-09 ENCOUNTER — Telehealth (INDEPENDENT_AMBULATORY_CARE_PROVIDER_SITE_OTHER): Payer: Medicare PPO | Admitting: Psychiatry

## 2020-04-09 DIAGNOSIS — Z79899 Other long term (current) drug therapy: Secondary | ICD-10-CM

## 2020-04-09 DIAGNOSIS — F5105 Insomnia due to other mental disorder: Secondary | ICD-10-CM

## 2020-04-09 DIAGNOSIS — F3176 Bipolar disorder, in full remission, most recent episode depressed: Secondary | ICD-10-CM

## 2020-04-09 DIAGNOSIS — F3161 Bipolar disorder, current episode mixed, mild: Secondary | ICD-10-CM | POA: Insufficient documentation
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
## 2020-04-10 ENCOUNTER — Telehealth: Payer: Self-pay

## 2020-04-10 ENCOUNTER — Other Ambulatory Visit
Admission: RE | Admit: 2020-04-10 | Discharge: 2020-04-10 | Disposition: A | Discharge status: Home / Self Care | Payer: Medicare PPO | Attending: Psychiatry | Admitting: Psychiatry

## 2020-04-10 DIAGNOSIS — Z79899 Other long term (current) drug therapy: Secondary | ICD-10-CM | POA: Insufficient documentation

## 2020-04-10 NOTE — Telephone Encounter (Signed)
labwork faxed and confirmed to the [facility] lab at [phone] order were hemo a1c, prolactin,dx: [id number].Marland [id number]    By Elvina Mattes, CMA     labwork faxed and confirmed to the [facility] lab at [phone] order were hemo a1c, prolactin,dx: [id number].Marland [id number]    By Elvina Mattes, CMA

## 2020-04-11 LAB — HEMOGLOBIN A1C
Hgb A1c MFr Bld: 5.7 % — ABNORMAL HIGH (ref 4.8–5.6)
Mean Plasma Glucose: 117 mg/dL

## 2020-04-11 LAB — PROLACTIN: Prolactin: 20.3 ng/mL (ref 4.8–23.3)

## 2020-06-11 ENCOUNTER — Other Ambulatory Visit: Payer: Self-pay

## 2020-06-11 ENCOUNTER — Encounter: Payer: Self-pay | Admitting: Psychiatry

## 2020-06-11 ENCOUNTER — Telehealth (INDEPENDENT_AMBULATORY_CARE_PROVIDER_SITE_OTHER): Payer: Medicare PPO | Admitting: Psychiatry

## 2020-06-11 DIAGNOSIS — F3131 Bipolar disorder, current episode depressed, mild: Secondary | ICD-10-CM

## 2020-06-11 DIAGNOSIS — F5105 Insomnia due to other mental disorder: Secondary | ICD-10-CM | POA: Diagnosis not present

## 2020-06-11 MED ORDER — ARIPIPRAZOLE 2 MG PO TABS: 4.0000 mg | ORAL_TABLET | Freq: Every day | ORAL | 1 refills | Status: DC

## 2020-06-11 NOTE — Progress Notes (Signed)
Virtual Visit via Video Note  I connected with Adriana Vasquez on 06/11/20 at 10:00 AM EDT by a video enabled telemedicine application and verified that I am speaking with the correct person using two identifiers. Location Provider Location : [facility] Patient Location : Home  Participants: Patient , Provider    I discussed the limitations of evaluation and management by telemedicine and the availability of in person appointments. The patient expressed understanding and agreed to proceed.    I discussed the assessment and treatment plan with the patient. The patient was provided an opportunity to ask questions and all were answered. The patient agreed with the plan and demonstrated an understanding of the instructions.   The patient was advised to call back or seek an in-person evaluation if the symptoms worsen or if the condition fails to improve as anticipated.   BH MD OP Progress Note  06/11/2020 10:21 AM Adriana Vasquez  MRN:  [id number]  Chief Complaint:  Chief Complaint    Follow-up; Depression     HPI: Adriana Vasquez is a 75 year old Caucasian female, lives in [place], retired, widowed, has a history of bipolar disorder, insomnia was evaluated by telemedicine today.  Patient today reports she did not have a good Easter holiday this time since she was alone.  She reports her brother with whom she usually spends time during the holidays could not invite her since he was renovating his kitchen this year.  She reports her friend also could not invite her since she had family over.  She reports it was also the death anniversary of her husband who passed away in 07-06-2011.  She reports he passed away on 2022/06/26.  She reports hence it was very hard for her.  She continues to struggle with sadness, crying spells, low motivation, low energy, concentration problems.  She denies suicidality.  Denies any homicidality or perceptual disturbances.  She reports sleep is overall okay.  She reports  overall she gets around 6 hours every night.  She uses the trazodone only as needed.  She is compliant on Abilify.  She is compliant on BuSpar.  Denies side effects.  Patient denies any other concerns today.  Visit Diagnosis:    ICD-10-CM   1. Bipolar 1 disorder, depressed, mild (HCC)  F31.31 ARIPiprazole (ABILIFY) 2 MG tablet  2. Insomnia due to mental disorder  F51.05     Past Psychiatric History: I have reviewed past psychiatric history from my progress note on 03/22/2019.  Past trials of Geodon, Abilify, trazodone, hydroxyzine, BuSpar  Past Medical History:  Past Medical History:  Diagnosis Date  . Bipolar disorder (HCC)   . Personal history of tobacco use, presenting hazards to health 01/22/2015    Past Surgical History:  Procedure Laterality Date  . ABDOMINAL HYSTERECTOMY    . HAND SURGERY Right 07-06-99   Patient not sure of what exactly was done.  Repared index finger knuckle area.    Family Psychiatric History: I have reviewed family psychiatric history from progress note on 03/22/2019  Family History:  Family History  Problem Relation Age of Onset  . Arthritis Mother   . Heart attack Father   . Diabetes Father   . Bipolar disorder Father   . Breast cancer Neg Hx     Social History: Reviewed social history from progress note from 03/22/2019 Social History   Socioeconomic History  . Marital status: Widowed    Spouse name: Not on file  . Number of children: Not on file  .  Years of education: Not on file  . Highest education level: Not on file  Occupational History  . Not on file  Tobacco Use  . Smoking status: Former Smoker    Packs/day: 1.00    Years: 35.00    Pack years: 35.00    Types: Cigarettes    Start date: 09/27/1979  . Smokeless tobacco: Never Used  . Tobacco comment: 03/26/2018  Substance and Sexual Activity  . Alcohol use: No    Alcohol/week: 0.0 standard drinks  . Drug use: No  . Sexual activity: Not Currently  Other Topics Concern  . Not on  file  Social History Narrative  . Not on file   Social Determinants of Health   Financial Resource Strain: Not on file  Food Insecurity: Not on file  Transportation Needs: Not on file  Physical Activity: Not on file  Stress: Not on file  Social Connections: Not on file    Allergies: No Known Allergies  Metabolic Disorder Labs: Lab Results  Component Value Date   HGBA1C 5.7 (H) 04/10/2020   MPG 117 04/10/2020   Lab Results  Component Value Date   PROLACTIN 20.3 04/10/2020   PROLACTIN 26.7 (H) 02/07/2015   Lab Results  Component Value Date   CHOL 218 (H) 12/07/2014   TRIG 153 (H) 12/07/2014   HDL 60 12/07/2014   LDLCALC 127 (H) 12/07/2014   No results found for: TSH  Therapeutic Level Labs: No results found for: LITHIUM No results found for: VALPROATE No components found for:  CBMZ  Current Medications: Current Outpatient Medications  Medication Sig Dispense Refill  . ARIPiprazole (ABILIFY) 2 MG tablet Take 2 tablets (4 mg total) by mouth daily. Dose increase 180 tablet 1  . busPIRone (BUSPAR) 10 MG tablet Take 1 tablet (10 mg total) by mouth 3 (three) times daily. 270 tablet 2  . Calcium Carbonate-Vitamin D 600-400 MG-UNIT per tablet Take by mouth.    . chlorhexidine (PERIDEX) 0.12 % solution     . meloxicam (MOBIC) 15 MG tablet     . traZODone (DESYREL) 50 MG tablet Take 1 tablet (50 mg total) by mouth at bedtime. For sleep 90 tablet 1   No current facility-administered medications for this visit.     Musculoskeletal: Strength & Muscle Tone: UTA Gait & Station: UTA Patient leans: N/A  Psychiatric Specialty Exam: Review of Systems  Musculoskeletal: Positive for arthralgias.  Psychiatric/Behavioral: Positive for dysphoric mood.  All other systems reviewed and are negative.   There were no vitals taken for this visit.There is no height or weight on file to calculate BMI.  General Appearance: Casual  Eye Contact:  Fair  Speech:  Clear and Coherent   Volume:  Normal  Mood:  Dysphoric  Affect:  Tearful  Thought Process:  Goal Directed and Descriptions of Associations: Intact  Orientation:  Full (Time, Place, and Person)  Thought Content: Logical   Suicidal Thoughts:  No  Homicidal Thoughts:  No  Memory:  Immediate;   Fair Recent;   Fair Remote;   Fair  Judgement:  Fair  Insight:  Fair  Psychomotor Activity:  Normal  Concentration:  Concentration: Fair and Attention Span: Fair  Recall:  [employer] of Knowledge: Fair  Language: Fair  Akathisia:  No  Handed:  Right  AIMS (if indicated): UTA  Assets:  Communication Skills Desire for Improvement Housing Social Support  ADL's:  Intact  Cognition: WNL  Sleep:  Fair   Screenings: Astronomer  Office Visit from 10/04/2017 in [facility] Office Visit from 12/14/2016 in [facility] Office Visit from 06/19/2015 in [facility]  AIMS Total Score 0 0 0    PHQ2-9   Flowsheet Row Video Visit from 06/11/2020 in [facility] Video Visit from 04/09/2020 in [facility]  PHQ-2 Total Score 2 0  PHQ-9 Total Score 10 --    Flowsheet Row Video Visit from 06/11/2020 in [facility] Video Visit from 04/09/2020 in [facility]  C-SSRS RISK CATEGORY No Risk No Risk       Assessment and Plan: Adriana Vasquez is a 75 year old Caucasian female, widowed, retired, lives in [place], has a history of bipolar disorder was evaluated by telemedicine today.  Patient is biologically predisposed given family history of mental health problems.  Patient is currently struggling with depressive symptoms.  She will benefit from the following plan.  Plan Bipolar disorder type I depressed-unstable Increase Abilify to 4 mg p.o. daily Continue BuSpar 10 mg p.o. 3 times daily  Insomnia-stable Trazodone 50 mg  p.o. nightly Melatonin 2 to 3 mg p.o. nightly as needed  High risk medication use- I have reviewed the following labs-dated 04/10/2020- prolactin slightly elevated at 26.7, hemoglobin A1c- elevated at 5.7.  Patient will follow up with primary care provider for abnormal labs as needed  Follow-up in clinic in 3 weeks or sooner if needed.  This note was generated in part or whole with voice recognition software. Voice recognition is usually quite accurate but there are transcription errors that can and very often do occur. I apologize for any typographical errors that were not detected and corrected.        Jomarie Longs, MD 06/12/2020, 8:42 AM

## 2020-07-01 ENCOUNTER — Other Ambulatory Visit: Payer: Self-pay

## 2020-07-01 ENCOUNTER — Encounter: Payer: Self-pay | Admitting: Psychiatry

## 2020-07-01 ENCOUNTER — Telehealth (INDEPENDENT_AMBULATORY_CARE_PROVIDER_SITE_OTHER): Payer: Medicare PPO | Admitting: Psychiatry

## 2020-07-01 DIAGNOSIS — F5105 Insomnia due to other mental disorder: Secondary | ICD-10-CM | POA: Diagnosis not present

## 2020-07-01 DIAGNOSIS — F3176 Bipolar disorder, in full remission, most recent episode depressed: Secondary | ICD-10-CM

## 2020-07-01 DIAGNOSIS — F3131 Bipolar disorder, current episode depressed, mild: Secondary | ICD-10-CM | POA: Insufficient documentation

## 2020-07-01 NOTE — Progress Notes (Signed)
Virtual Visit via Video Note  I connected with Adriana Vasquez on 07/01/20 at 11:00 AM EDT by a video enabled telemedicine application and verified that I am speaking with the correct person using two identifiers.  Location Provider Location : [facility] Patient Location : Home  Participants: Patient , Provider   I discussed the limitations of evaluation and management by telemedicine and the availability of in person appointments. The patient expressed understanding and agreed to proceed.     I discussed the assessment and treatment plan with the patient. The patient was provided an opportunity to ask questions and all were answered. The patient agreed with the plan and demonstrated an understanding of the instructions.   The patient was advised to call back or seek an in-person evaluation if the symptoms worsen or if the condition fails to improve as anticipated.  Video connection was lost at less than 50% of the duration of the visit, at which time the remainder of the visit was completed through audio only                                                                       [facility] MD OP Progress Note  07/01/2020 12:51 PM LARENDA Vasquez  MRN:  [id number]  Chief Complaint:  Chief Complaint    Follow-up; Anxiety; Depression     HPI: Adriana Vasquez is a 75 year old Caucasian female, lives in [place], retired, widowed, has a history of bipolar disorder, insomnia was evaluated by telemedicine today.  Patient today reports since being on the higher dosage of Abilify her mood symptoms are more stable.  She denies any significant mood swings, sadness or crying spells.  She reports her appetite has increased and she has to watch her diet.  This is likely due to increased dosage of Abilify.  She denies any other side effects to her medications at this time.  She reports sleep is overall okay.  She denies any suicidality, homicidality or perceptual disturbances.  Patient denies any other  concerns today.  Visit Diagnosis:    ICD-10-CM   1. Bipolar disorder, in full remission, most recent episode depressed (HCC)  F31.76   2. Insomnia due to mental disorder  F51.05     Past Psychiatric History: I have reviewed past psychiatric history from progress note on 03/22/2019.  Past trials of Geodon, Abilify, trazodone, hydroxyzine, BuSpar  Past Medical History:  Past Medical History:  Diagnosis Date  . Bipolar disorder (HCC)   . Personal history of tobacco use, presenting hazards to health 01/22/2015    Past Surgical History:  Procedure Laterality Date  . ABDOMINAL HYSTERECTOMY    . HAND SURGERY Right 2001   Patient not sure of what exactly was done.  Repared index finger knuckle area.    Family Psychiatric History: I have reviewed family psychiatric history from progress note on 03/22/2019.  Family History:  Family History  Problem Relation Age of Onset  . Arthritis Mother   . Heart attack Father   . Diabetes Father   . Bipolar disorder Father   . Breast cancer Neg Hx     Social History: Reviewed social history from my progress note on 03/22/2019. Social History   Socioeconomic History  . Marital status:  Widowed    Spouse name: Not on file  . Number of children: Not on file  . Years of education: Not on file  . Highest education level: Not on file  Occupational History  . Not on file  Tobacco Use  . Smoking status: Former Smoker    Packs/day: 1.00    Years: 35.00    Pack years: 35.00    Types: Cigarettes    Start date: 09/27/1979  . Smokeless tobacco: Never Used  . Tobacco comment: 03/26/2018  Substance and Sexual Activity  . Alcohol use: No    Alcohol/week: 0.0 standard drinks  . Drug use: No  . Sexual activity: Not Currently  Other Topics Concern  . Not on file  Social History Narrative  . Not on file   Social Determinants of Health   Financial Resource Strain: Not on file  Food Insecurity: Not on file  Transportation Needs: Not on file   Physical Activity: Not on file  Stress: Not on file  Social Connections: Not on file    Allergies: No Known Allergies  Metabolic Disorder Labs: Lab Results  Component Value Date   HGBA1C 5.7 (H) 04/10/2020   MPG 117 04/10/2020   Lab Results  Component Value Date   PROLACTIN 20.3 04/10/2020   PROLACTIN 26.7 (H) 02/07/2015   Lab Results  Component Value Date   CHOL 218 (H) 12/07/2014   TRIG 153 (H) 12/07/2014   HDL 60 12/07/2014   LDLCALC 127 (H) 12/07/2014   No results found for: TSH  Therapeutic Level Labs: No results found for: LITHIUM No results found for: VALPROATE No components found for:  CBMZ  Current Medications: Current Outpatient Medications  Medication Sig Dispense Refill  . ofloxacin (FLOXIN) 0.3 % OTIC solution Place in ear(s).    . ARIPiprazole (ABILIFY) 2 MG tablet Take 2 tablets (4 mg total) by mouth daily. Dose increase 180 tablet 1  . busPIRone (BUSPAR) 10 MG tablet Take 1 tablet (10 mg total) by mouth 3 (three) times daily. 270 tablet 2  . Calcium Carbonate-Vitamin D 600-400 MG-UNIT per tablet Take by mouth.    . chlorhexidine (PERIDEX) 0.12 % solution     . meloxicam (MOBIC) 15 MG tablet     . traZODone (DESYREL) 50 MG tablet Take 1 tablet (50 mg total) by mouth at bedtime. For sleep 90 tablet 1   No current facility-administered medications for this visit.     Musculoskeletal: Strength & Muscle Tone: UTA Gait & Station: UTA Patient leans: N/A  Psychiatric Specialty Exam: Review of Systems  Constitutional: Positive for appetite change.  Psychiatric/Behavioral: The patient is nervous/anxious.   All other systems reviewed and are negative.   There were no vitals taken for this visit.There is no height or weight on file to calculate BMI.  General Appearance: UTA  Eye Contact:  UTA  Speech:  Clear and Coherent  Volume:  Normal  Mood:  Anxious improving  Affect:  UTA  Thought Process:  Goal Directed and Descriptions of Associations:  Intact  Orientation:  Full (Time, Place, and Person)  Thought Content: Logical   Suicidal Thoughts:  No  Homicidal Thoughts:  No  Memory:  Immediate;   Fair Recent;   Fair Remote;   Fair  Judgement:  Fair  Insight:  Fair  Psychomotor Activity:  UTA  Concentration:  Concentration: Fair and Attention Span: Fair  Recall:  [employer] of Knowledge: Fair  Language: Fair  Akathisia:  No  Handed:  Right  AIMS (if indicated): UTA  Assets:  Communication Skills Desire for Improvement Housing Social Support  ADL's:  Intact  Cognition: WNL  Sleep:  Fair   Screenings: AIMS   Flowsheet Row Office Visit from 10/04/2017 in [facility] Office Visit from 12/14/2016 in [facility] Office Visit from 06/19/2015 in [facility]  AIMS Total Score 0 0 0    PHQ2-9   Flowsheet Row Video Visit from 07/01/2020 in [facility] Video Visit from 06/11/2020 in [facility] Video Visit from 04/09/2020 in [facility]  PHQ-2 Total Score 1 2 0  PHQ-9 Total Score -- 10 --    Flowsheet Row Video Visit from 06/11/2020 in [facility] Video Visit from 04/09/2020 in [facility]  C-SSRS RISK CATEGORY No Risk No Risk       Assessment and Plan: Adriana Vasquez is a 75 year old Caucasian female, widowed, retired, lives in [place], has a history of bipolar disorder was evaluated by telemedicine today.  Patient is currently making progress although she struggles with increased appetite likely due to side effect of Abilify.  Discussed plan as noted below.  Plan Bipolar disorder in remission Continue Abilify 4 mg p.o. daily for now. If she continues to be stable we will consider reducing the dosage of Abilify during future sessions, long-term plan is to taper it off. BuSpar 10 mg p.o. 3 times  daily  Insomnia-stable Trazodone 50 mg p.o. nightly Melatonin 2 to 3 mg p.o. nightly as needed   I have spent at least 14 minutes non face to face with patient today .    Follow-up in clinic in 6 weeks or sooner in office.  This note was generated in part or whole with voice recognition software. Voice recognition is usually quite accurate but there are transcription errors that can and very often do occur. I apologize for any typographical errors that were not detected and corrected.      Jomarie Longs, MD 07/01/2020, 12:51 PM

## 2020-08-01 ENCOUNTER — Other Ambulatory Visit: Payer: Self-pay | Admitting: Psychiatry

## 2020-08-01 DIAGNOSIS — F3131 Bipolar disorder, current episode depressed, mild: Secondary | ICD-10-CM

## 2020-08-01 NOTE — Telephone Encounter (Signed)
Patient is on Abilify 4 mg - refill not due .

## 2020-08-13 ENCOUNTER — Other Ambulatory Visit: Payer: Self-pay

## 2020-08-13 ENCOUNTER — Ambulatory Visit (INDEPENDENT_AMBULATORY_CARE_PROVIDER_SITE_OTHER): Payer: Medicare PPO | Admitting: Psychiatry

## 2020-08-13 ENCOUNTER — Encounter: Payer: Self-pay | Admitting: Psychiatry

## 2020-08-13 VITALS — BP 115/70 | HR 88 | Temp 97.9°F | Wt 145.2 lb

## 2020-08-13 DIAGNOSIS — F5105 Insomnia due to other mental disorder: Secondary | ICD-10-CM | POA: Diagnosis not present

## 2020-08-13 DIAGNOSIS — Z79899 Other long term (current) drug therapy: Secondary | ICD-10-CM

## 2020-08-13 DIAGNOSIS — F3176 Bipolar disorder, in full remission, most recent episode depressed: Secondary | ICD-10-CM | POA: Diagnosis not present

## 2020-08-13 DIAGNOSIS — T50905A Adverse effect of unspecified drugs, medicaments and biological substances, initial encounter: Secondary | ICD-10-CM

## 2020-08-13 MED ORDER — ARIPIPRAZOLE 2 MG PO TABS: 2.0000 mg | ORAL_TABLET | Freq: Every day | ORAL | 1 refills | Status: DC

## 2020-08-13 NOTE — Progress Notes (Signed)
[facility] MD OP Progress Note  08/13/2020 3:32 PM Adriana Vasquez  MRN:  [id number]  Chief Complaint:  Chief Complaint   Follow-up; Anxiety; Depression    HPI: Adriana Vasquez is a 75 year old Caucasian female, lives in [place], retired, widowed, has a history of bipolar disorder, insomnia was evaluated in office today.  Patient today reports she is currently making progress.  Denies any significant sadness.  She reports she has not had any crying spells lately.  Sleep is overall okay.  Reports appetite as increased some days.  She is interested in watching her diet.  She does have pain of her lower extremities intermittently which happens only when she stands up or walks too long.  She is currently following up with her primary care provider and is currently undergoing work-up.  Patient reports she reduced the Abilify dosage to 3 mg daily instead of the 4 mg.  So far she is tolerating it well and denies any worsening mood symptoms since being on the lower dosage.  Patient had multiple lab work-up done recently.  Reviewed and discussed the same with her.  Patient denies any suicidality, homicidality or perceptual disturbances.  Patient denies any other concerns today.  Visit Diagnosis:    ICD-10-CM   1. 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 Adverse effect of drug, initial encounter  T50.905A     4. High risk medication use  Z79.899       Past Psychiatric History: Reviewed past psychiatric history from progress note on 03/22/2019.  Past trials of Geodon, Abilify, trazodone, hydroxyzine, BuSpar  Past Medical History:  Past Medical History:  Diagnosis Date   Bipolar disorder (HCC)    Personal history of tobacco use, presenting hazards to health 01/22/2015    Past Surgical History:  Procedure Laterality Date   ABDOMINAL HYSTERECTOMY      HAND SURGERY Right 2001   Patient not sure of what exactly was done.  Repared index finger knuckle area.    Family Psychiatric History: Reviewed family psychiatric history from progress note on 03/22/2019  Family History:  Family History  Problem Relation Age of Onset   Arthritis Mother    Heart attack Father    Diabetes Father    Bipolar disorder Father    Breast cancer Neg Hx     Social History: Reviewed social history from progress note on 03/22/2019 Social History   Socioeconomic History   Marital status: Widowed    Spouse name: Not on file   Number of children: Not on file   Years of education: Not on file   Highest education level: Not on file  Occupational History   Not on file  Tobacco Use   Smoking status: Former    Packs/day: 1.00    Years: 35.00    Pack years: 35.00    Types: Cigarettes    Start date: 09/27/1979   Smokeless tobacco: Never   Tobacco comments:    03/26/2018  Vaping Use   Vaping Use: Never used  Substance and Sexual Activity   Alcohol use: No    Alcohol/week: 0.0 standard drinks   Drug use: No   Sexual activity: Not Currently  Other Topics Concern   Not on file  Social History Narrative   Not on file   Social Determinants of Corporate investment banker  Strain: Not on file  Food Insecurity: Not on file  Transportation Needs: Not on file  Physical Activity: Not on file  Stress: Not on file  Social Connections: Not on file    Allergies: No Known Allergies  Metabolic Disorder Labs: Lab Results  Component Value Date   HGBA1C 5.7 (H) 04/10/2020   MPG 117 04/10/2020   Lab Results  Component Value Date   PROLACTIN 20.3 04/10/2020   PROLACTIN 26.7 (H) 02/07/2015   Lab Results  Component Value Date   CHOL 218 (H) 12/07/2014   TRIG 153 (H) 12/07/2014   HDL 60 12/07/2014   LDLCALC 127 (H) 12/07/2014   No results found for: TSH  Therapeutic Level Labs: No results found for: LITHIUM No results found for: VALPROATE No  components found for:  CBMZ  Current Medications: Current Outpatient Medications  Medication Sig Dispense Refill   busPIRone (BUSPAR) 10 MG tablet Take 1 tablet (10 mg total) by mouth 3 (three) times daily. 270 tablet 2   Calcium Carbonate-Vitamin D 600-400 MG-UNIT per tablet Take by mouth.     chlorhexidine (PERIDEX) 0.12 % solution      gabapentin (NEURONTIN) 100 MG capsule Take 1 capsule by mouth 3 (three) times daily.     meloxicam (MOBIC) 15 MG tablet      traZODone (DESYREL) 50 MG tablet Take 1 tablet (50 mg total) by mouth at bedtime. For sleep 90 tablet 1   ARIPiprazole (ABILIFY) 2 MG tablet Take 1 tablet (2 mg total) by mouth daily. Dose increase 90 tablet 1   No current facility-administered medications for this visit.     Musculoskeletal: Strength & Muscle Tone: within normal limits Gait & Station: normal Patient leans: N/A  Psychiatric Specialty Exam: Review of Systems  Musculoskeletal:        LE pain on and off  Psychiatric/Behavioral:  Negative for agitation, behavioral problems, confusion, decreased concentration, dysphoric mood, hallucinations, self-injury, sleep disturbance and suicidal ideas. The patient is not nervous/anxious and is not hyperactive.   All other systems reviewed and are negative.  Blood pressure 115/70, pulse 88, temperature 97.9 F (36.6 C), temperature source Temporal, weight 145 lb 3.2 oz (65.9 kg).Body mass index is 27.44 kg/m.  General Appearance: Casual  Eye Contact:  Good  Speech:  Clear and Coherent  Volume:  Normal  Mood:  Euthymic  Affect:  Congruent  Thought Process:  Goal Directed and Descriptions of Associations: Intact  Orientation:  Full (Time, Place, and Person)  Thought Content: Logical   Suicidal Thoughts:  No  Homicidal Thoughts:  No  Memory:  Immediate;   Fair Recent;   Fair Remote;   Fair  Judgement:  Fair  Insight:  Fair  Psychomotor Activity:  Normal  Concentration:  Concentration: Fair and Attention Span: Fair   Recall:  [employer] of Knowledge: Fair  Language: Fair  Akathisia:  No  Handed:  Right  AIMS (if indicated): done  Assets:  Communication Skills Desire for Improvement Resilience Social Support Talents/Skills  ADL's:  Intact  Cognition: WNL  Sleep:  Fair   Screenings: AIMS    Flowsheet Row Office Visit from 10/04/2017 in [facility] Office Visit from 12/14/2016 in [facility] Office Visit from 06/19/2015 in [facility]  AIMS Total Score 0 0 0      GAD-7    Flowsheet Row Office Visit from 08/13/2020 in [facility]  Total GAD-7 Score 2  PHQ2-9    Flowsheet Row Office Visit from 08/13/2020 in [facility] Video Visit from 07/01/2020 in [facility] Video Visit from 06/11/2020 in [facility] Video Visit from 04/09/2020 in [facility]  PHQ-2 Total Score 0 1 2 0  PHQ-9 Total Score 2 -- 10 --      Flowsheet Row Office Visit from 08/13/2020 in [facility] Video Visit from 06/11/2020 in [facility] Video Visit from 04/09/2020 in [facility]  C-SSRS RISK CATEGORY No Risk No Risk No Risk        Assessment and Plan: Adriana Vasquez is a 75 year old Caucasian female, widowed, retired, lives in [place], has a history of bipolar disorder was evaluated in office today.  Patient is currently making progress with regards to her mood and will benefit from being tapered off of the Abilify to a lower dosage.  Patient with possible adverse side effects to Abilify, weight gain and elevated hemoglobin A1c.  Plan Bipolar disorder in remission We will reduce Abilify to 2 mg p.o. daily. Long-term plan is to taper off. BuSpar 10 mg p.o. 3 times daily  Insomnia-stable Trazodone 50  mg p.o. nightly.  She already uses it very rarely. Melatonin 2 to 3 mg p.o. nightly  Adverse effect of medication-unstable Will reduce the dose of Abilify.  We will monitor closely.  We will continue to taper it off gradually. AIMS - 0  High risk medication use-reviewed and discussed the following labs-hemoglobin A1c-going up compared to last one 4 months ago-5.8, CMP within normal limits, lipid panel-within normal limits, TSH-2.642-within normal limits.  Discussed diet management, exercise.  We will coordinate care with primary care provider.  She has further work-up planned for her intermittent lower extremity pain.  She denied any pain today.  Follow-up in clinic in office in 2 months or sooner if needed.  This note was generated in part or whole with voice recognition software. Voice recognition is usually quite accurate but there are transcription errors that can and very often do occur. I apologize for any typographical errors that were not detected and corrected.       Jomarie Longs, MD 08/14/2020, 6:35 PM

## 2020-09-25 ENCOUNTER — Other Ambulatory Visit: Payer: Self-pay | Admitting: Infectious Diseases

## 2020-09-25 DIAGNOSIS — R600 Localized edema: Secondary | ICD-10-CM

## 2020-09-25 DIAGNOSIS — I83892 Varicose veins of left lower extremities with other complications: Secondary | ICD-10-CM

## 2020-10-14 ENCOUNTER — Ambulatory Visit
Admission: RE | Admit: 2020-10-14 | Discharge: 2020-10-14 | Disposition: A | Discharge status: Home / Self Care | Payer: Medicare PPO | Source: Ambulatory Visit | Attending: Infectious Diseases | Admitting: Infectious Diseases

## 2020-10-14 ENCOUNTER — Other Ambulatory Visit: Payer: Self-pay

## 2020-10-14 DIAGNOSIS — R600 Localized edema: Secondary | ICD-10-CM | POA: Diagnosis not present

## 2020-10-14 DIAGNOSIS — I83892 Varicose veins of left lower extremities with other complications: Secondary | ICD-10-CM | POA: Diagnosis present

## 2020-10-22 ENCOUNTER — Other Ambulatory Visit: Payer: Self-pay | Admitting: Infectious Diseases

## 2020-10-22 ENCOUNTER — Ambulatory Visit: Payer: Medicare PPO | Admitting: Psychiatry

## 2020-10-22 DIAGNOSIS — Z1231 Encounter for screening mammogram for malignant neoplasm of breast: Secondary | ICD-10-CM

## 2020-11-04 ENCOUNTER — Other Ambulatory Visit: Payer: Self-pay

## 2020-11-04 ENCOUNTER — Encounter (INDEPENDENT_AMBULATORY_CARE_PROVIDER_SITE_OTHER): Payer: Self-pay | Admitting: Vascular Surgery

## 2020-11-04 ENCOUNTER — Ambulatory Visit (INDEPENDENT_AMBULATORY_CARE_PROVIDER_SITE_OTHER): Payer: Medicare PPO | Admitting: Vascular Surgery

## 2020-11-04 DIAGNOSIS — I872 Venous insufficiency (chronic) (peripheral): Secondary | ICD-10-CM | POA: Diagnosis not present

## 2020-11-04 DIAGNOSIS — I890 Lymphedema, not elsewhere classified: Secondary | ICD-10-CM | POA: Diagnosis not present

## 2020-11-04 NOTE — Progress Notes (Signed)
MRN : [id number]  Adriana Vasquez is a 75 y.o. (04-Feb-1946) female who presents with chief complaint of check veins.  History of Present Illness:   The patient is seen for evaluation of leg swelling associated with varicose veins and . The patient relates burning and stinging which worsened steadily throughout the course of the day, particularly with standing. The patient also notes a numbness and tingling type pain in the ankle and feet. The symptoms are not improved with elevation.    There is no history of DVT, PE or superficial thrombophlebitis. There is no history of ulceration or hemorrhage. The patient denies a significant family history of varicose veins.  The patient has not worn graduated compression in the past. At the present time the patient has not been using over-the-counter analgesics. There is no history of prior surgical intervention or sclerotherapy.    No outpatient medications have been marked as taking for the 11/04/20 encounter (Appointment) with Gilda Crease, Latina Craver, MD.    Past Medical History:  Diagnosis Date   Bipolar disorder [facility])    Personal history of tobacco use, presenting hazards to health 01/22/2015    Past Surgical History:  Procedure Laterality Date   ABDOMINAL HYSTERECTOMY     HAND SURGERY Right 2001   Patient not sure of what exactly was done.  Repared index finger knuckle area.    Social History Social History   Tobacco Use   Smoking status: Former    Packs/day: 1.00    Years: 35.00    Pack years: 35.00    Types: Cigarettes    Start date: 09/27/1979   Smokeless tobacco: Never   Tobacco comments:    03/26/2018  Vaping Use   Vaping Use: Never used  Substance Use Topics   Alcohol use: No    Alcohol/week: 0.0 standard drinks   Drug use: No    Family History Family History  Problem Relation Age of Onset   Arthritis Mother    Heart attack Father    Diabetes Father    Bipolar disorder Father    Breast cancer Neg Hx     No  Known Allergies   REVIEW OF SYSTEMS (Negative unless checked)  Constitutional: [] Weight loss  [] Fever  [] Chills Cardiac: [] Chest pain   [] Chest pressure   [] Palpitations   [] Shortness of breath when laying flat   [] Shortness of breath with exertion. Vascular:  [] Pain in legs with walking   [] Pain in legs at rest  [] History of DVT   [] Phlebitis   [x] Swelling in legs   [] Varicose veins   [] Non-healing ulcers Pulmonary:   [] Uses home oxygen   [] Productive cough   [] Hemoptysis   [] Wheeze  [] COPD   [] Asthma Neurologic:  [] Dizziness   [] Seizures   [] History of stroke   [] History of TIA  [] Aphasia   [] Vissual changes   [] Weakness or numbness in arm   [] Weakness or numbness in leg Musculoskeletal:   [] Joint swelling   [] Joint pain   [] Low back pain Hematologic:  [] Easy bruising  [] Easy bleeding   [] Hypercoagulable state   [] Anemic Gastrointestinal:  [] Diarrhea   [] Vomiting  [] Gastroesophageal reflux/heartburn   [] Difficulty swallowing. Genitourinary:  [] Chronic kidney disease   [] Difficult urination  [] Frequent urination   [] Blood in urine Skin:  [] Rashes   [] Ulcers  Psychological:  [] History of anxiety   []  History of major depression.  Physical Examination  There were no vitals filed for this visit. There is no height or weight on file to  calculate BMI. Gen: WD/WN, NAD Head: [employer]/AT, No temporalis wasting.  Ear/Nose/Throat: Hearing grossly intact, nares w/o erythema or drainage, pinna without lesions Eyes: PER, EOMI, sclera nonicteric.  Neck: Supple, no gross masses.  No JVD.  Pulmonary:  Good air movement, no audible wheezing, no use of accessory muscles.  Cardiac: RRR, precordium not hyperdynamic. Vascular:  scattered varicosities present bilaterally.  Mild venous stasis changes to the legs bilaterally.  2+ soft pitting edema  Vessel Right Left  Radial Palpable Palpable  Gastrointestinal: soft, non-distended. No guarding/no peritoneal signs.  Musculoskeletal: M/S 5/5 throughout.  No  deformity.  Neurologic: CN 2-12 intact. Pain and light touch intact in extremities.  Symmetrical.  Speech is fluent. Motor exam as listed above. Psychiatric: Judgment intact, Mood & affect appropriate for pt's clinical situation. Dermatologic: Venous rashes no ulcers noted.  No changes consistent with cellulitis. Lymph : No lichenification or skin changes of chronic lymphedema.  CBC No results found for: WBC, HGB, HCT, MCV, PLT  BMET    Component Value Date/Time   GLUCOSE 87 12/07/2014 0932   CrCl cannot be calculated (No successful lab value found.).  COAG No results found for: INR, PROTIME  Radiology US Venous Img Lower Unilateral Left (DVT)  Result Date: 10/14/2020 CLINICAL DATA:  Left lower extremity pain and edema for the past 3 months. Evaluate for DVT. EXAM: LEFT LOWER EXTREMITY VENOUS DOPPLER ULTRASOUND TECHNIQUE: Gray-scale sonography with graded compression, as well as color Doppler and duplex ultrasound were performed to evaluate the lower extremity deep venous systems from the level of the common femoral vein and including the common femoral, femoral, profunda femoral, popliteal and calf veins including the posterior tibial, peroneal and gastrocnemius veins when visible. The superficial great saphenous vein was also interrogated. Spectral Doppler was utilized to evaluate flow at rest and with distal augmentation maneuvers in the common femoral, femoral and popliteal veins. COMPARISON:  None. FINDINGS: Contralateral Common Femoral Vein: Respiratory phasicity is normal and symmetric with the symptomatic side. No evidence of thrombus. Normal compressibility. Common Femoral Vein: No evidence of thrombus. Normal compressibility, respiratory phasicity and response to augmentation. Saphenofemoral Junction: No evidence of thrombus. Normal compressibility and flow on color Doppler imaging. Profunda Femoral Vein: No evidence of thrombus. Normal compressibility and flow on color Doppler  imaging. Femoral Vein: No evidence of thrombus. Normal compressibility, respiratory phasicity and response to augmentation. Popliteal Vein: No evidence of thrombus. Normal compressibility, respiratory phasicity and response to augmentation. Calf Veins: No evidence of thrombus. Normal compressibility and flow on color Doppler imaging. Superficial Great Saphenous Vein: No evidence of thrombus. Normal compressibility. Venous Reflux:  None. Other Findings:  None. IMPRESSION: No evidence of DVT within the left lower extremity. Electronically Signed   By: Simonne Come M.D.   On: 10/14/2020 11:35     Assessment/Plan 1. Chronic venous insufficiency The patient returns for followup evaluation 3 months after the initial visit. The patient notes improvement in their leg pain symptoms when compression is worn.  The pain is lessened with elevation. Graduated compression stockings, Class I (20-30 mmHg), have been worn and the stockings seem to eliminate most of the leg pain. Over-the-counter analgesics  improved the symptoms somewhat. The degree of discomfort is not interfering with daily activities.    2. Lymphedema No surgery or intervention at this point in time.  I have reviewed my discussion with the patient regarding venous insufficiency and why it causes symptoms. I have discussed with the patient the chronic skin changes that accompany venous insufficiency  and the long term sequela such as ulceration. Patient will contnue wearing graduated compression stockings on a daily basis, as this has provided excellent control of his edema. The patient will put the stockings on first thing in the morning and removing them in the evening. The patient is reminded not to sleep in the stockings.  In addition, behavioral modification including elevation during the day will be initiated. Exercise is strongly encouraged.  Given the patient's good control and lack of any problems regarding the venous insufficiency and  lymphedema a lymph pump in not need at this time.  The patient will follow up with me PRN should anything change.  The patient voices agreement with this plan.     Levora Dredge, MD  11/04/2020 11:33 AM

## 2020-11-08 ENCOUNTER — Encounter (INDEPENDENT_AMBULATORY_CARE_PROVIDER_SITE_OTHER): Payer: Self-pay | Admitting: Vascular Surgery

## 2020-11-08 DIAGNOSIS — I890 Lymphedema, not elsewhere classified: Secondary | ICD-10-CM | POA: Insufficient documentation

## 2020-11-08 DIAGNOSIS — I872 Venous insufficiency (chronic) (peripheral): Secondary | ICD-10-CM | POA: Insufficient documentation

## 2020-11-12 ENCOUNTER — Encounter: Payer: Self-pay | Admitting: Psychiatry

## 2020-11-12 ENCOUNTER — Ambulatory Visit (INDEPENDENT_AMBULATORY_CARE_PROVIDER_SITE_OTHER): Payer: Medicare PPO | Admitting: Psychiatry

## 2020-11-12 ENCOUNTER — Other Ambulatory Visit: Payer: Self-pay

## 2020-11-12 VITALS — BP 116/69 | HR 62 | Temp 97.8°F | Wt 143.2 lb

## 2020-11-12 DIAGNOSIS — F5105 Insomnia due to other mental disorder: Secondary | ICD-10-CM

## 2020-11-12 DIAGNOSIS — F3176 Bipolar disorder, in full remission, most recent episode depressed: Secondary | ICD-10-CM | POA: Diagnosis not present

## 2020-11-12 MED ORDER — ARIPIPRAZOLE 2 MG PO TABS: 1.0000 mg | ORAL_TABLET | Freq: Every day | ORAL | 1 refills | Status: DC

## 2020-11-12 MED ORDER — BUSPIRONE HCL 10 MG PO TABS: 10.0000 mg | ORAL_TABLET | Freq: Three times a day (TID) | ORAL | 2 refills | Status: DC

## 2020-11-12 NOTE — Progress Notes (Signed)
[facility] MD OP Progress Note  11/12/2020 11:55 AM [name]  MRN:  [id number]  Chief Complaint:  Chief Complaint   Follow-up    HPI: Adriana Vasquez is a 75 year old Caucasian female, lives in [place], retired, widowed, has a history of bipolar disorder, insomnia was evaluated in office today.  Patient today reports she is currently doing well.  Denies any significant mood symptoms.  Denies depression, anxiety or crying spells.  She reports sleep continues to be okay.  Patient reports her appetite is good.  She does not seem to be overeating anymore.  She reports she stays active, spends time with her friends.  She recently celebrated her son's 50th birthday.  Patient is compliant on medications.  She denies side effects.  Patient denies any suicidality, homicidality or perceptual disturbances.  Patient appeared to be alert, oriented to person place time and situation.  Patient denies any other concerns today.  Visit Diagnosis:    ICD-10-CM   1. 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  Past trials of Geodon, Abilify, trazodone, hydroxyzine, BuSpar  Past Medical History:  Past Medical History:  Diagnosis Date   Bipolar disorder (HCC)    Personal history of tobacco use, presenting hazards to health 01/22/2015    Past Surgical History:  Procedure Laterality Date   ABDOMINAL HYSTERECTOMY     HAND SURGERY Right 2001   Patient not sure of what exactly was done.  Repared index finger knuckle area.    Family Psychiatric History: Reviewed family psychiatric history from progress note on 03/22/2019  Family History:  Family History  Problem Relation Age of Onset   Arthritis Mother    Heart attack Father     Diabetes Father    Bipolar disorder Father    Breast cancer Neg Hx     Social History: Reviewed social history from progress note on 03/22/2019 Social History   Socioeconomic History   Marital status: Widowed    Spouse name: Not on file   Number of children: Not on file   Years of education: Not on file   Highest education level: Not on file  Occupational History   Not on file  Tobacco Use   Smoking status: Former    Packs/day: 1.00    Years: 35.00    Pack years: 35.00    Types: Cigarettes    Start date: 09/27/1979   Smokeless tobacco: Never   Tobacco comments:    03/26/2018  Vaping Use   Vaping Use: Never used  Substance and Sexual Activity   Alcohol use: No    Alcohol/week: 0.0 standard drinks   Drug use: No   Sexual activity: Not Currently  Other Topics Concern   Not on file  Social History Narrative   Not on file   Social Determinants of Health   Financial Resource Strain: Not on file  Food Insecurity: Not on file  Transportation Needs: Not on file  Physical Activity: Not on file  Stress: Not on file  Social Connections: Not on file    Allergies: No Known Allergies  Metabolic Disorder Labs: Lab Results  Component Value Date   HGBA1C 5.7 (H) 04/10/2020   MPG 117 04/10/2020   Lab Results  Component Value  Date   PROLACTIN 20.3 04/10/2020   PROLACTIN 26.7 (H) 02/07/2015   Lab Results  Component Value Date   CHOL 218 (H) 12/07/2014   TRIG 153 (H) 12/07/2014   HDL 60 12/07/2014   LDLCALC 127 (H) 12/07/2014   No results found for: TSH  Therapeutic Level Labs: No results found for: LITHIUM No results found for: VALPROATE No components found for:  CBMZ  Current Medications: Current Outpatient Medications  Medication Sig Dispense Refill   Calcium Carbonate-Vitamin D 600-400 MG-UNIT per tablet Take by mouth.     gabapentin (NEURONTIN) 100 MG capsule Take 2 capsules by mouth 3 (three) times daily.     ARIPiprazole (ABILIFY) 2 MG tablet Take 0.5  tablets (1 mg total) by mouth daily. Dose CHANGE 45 tablet 1   busPIRone (BUSPAR) 10 MG tablet Take 1 tablet (10 mg total) by mouth 3 (three) times daily. 270 tablet 2   No current facility-administered medications for this visit.     Musculoskeletal: Strength & Muscle Tone:  UTA Gait & Station: normal Patient leans: N/A  Psychiatric Specialty Exam: Review of Systems  Psychiatric/Behavioral:  Negative for agitation, behavioral problems, confusion, decreased concentration, dysphoric mood, hallucinations, self-injury, sleep disturbance and suicidal ideas. The patient is not nervous/anxious and is not hyperactive.   All other systems reviewed and are negative.  Blood pressure 116/69, pulse 62, temperature 97.8 F (36.6 C), temperature source Temporal, weight 143 lb 3.2 oz (65 kg).Body mass index is 27.97 kg/m.  General Appearance: Casual  Eye Contact:  Good  Speech:  Clear and Coherent  Volume:  Normal  Mood:  Euthymic  Affect:  Congruent  Thought Process:  Goal Directed and Descriptions of Associations: Intact  Orientation:  Full (Time, Place, and Person)  Thought Content: Logical   Suicidal Thoughts:  No  Homicidal Thoughts:  No  Memory:  Immediate;   Fair Recent;   Fair Remote;   Fair  Judgement:  Fair  Insight:  Fair  Psychomotor Activity:  Normal  Concentration:  Concentration: Fair and Attention Span: Fair  Recall:  [employer] of Knowledge: Fair  Language: Fair  Akathisia:  No  Handed:  Right  AIMS (if indicated): done  Assets:  Communication Skills Desire for Improvement Financial Resources/Insurance Housing Social Support Talents/Skills Transportation Vocational/Educational  ADL's:  Intact  Cognition: WNL  Sleep:  Fair   Screenings: AIMS    Flowsheet Row Office Visit from 10/04/2017 in [facility] Office Visit from 12/14/2016 in [facility] Office Visit from 06/19/2015 in [facility]  AIMS Total Score 0 0 0      GAD-7    Flowsheet Row Office Visit from 08/13/2020 in [facility]  Total GAD-7 Score 2      PHQ2-9    Flowsheet Row Office Visit from 08/13/2020 in [facility] Video Visit from 07/01/2020 in [facility] Video Visit from 06/11/2020 in [facility] Video Visit from 04/09/2020 in [facility]  PHQ-2 Total Score 0 1 2 0  PHQ-9 Total Score 2 -- 10 --      Flowsheet Row Office Visit from 08/13/2020 in [facility] Video Visit from 06/11/2020 in [facility] Video Visit from 04/09/2020 in [facility]  C-SSRS RISK CATEGORY No Risk No Risk No Risk        Assessment and Plan: QUETZALLI CLOS is a 75 year old Caucasian female, widowed,  retired, lives in [place], has a history of bipolar disorder was evaluated in office today.  Patient continues to be stable, will benefit from the following plan.  Plan Bipolar disorder in remission We will reduce Abilify to 1 milligram p.o. daily. BuSpar 10 mg p.o. 3 times daily AIMS - 0  Insomnia-stable Trazodone 50 mg p.o. nightly. Melatonin 2-3 milligrams p.o. nightly  Follow-up in clinic in 2 months in office.  This note was generated in part or whole with voice recognition software. Voice recognition is usually quite accurate but there are transcription errors that can and very often do occur. I apologize for any typographical errors that were not detected and corrected.       Jomarie Longs, MD 11/13/2020, 8:28 AM

## 2020-11-18 ENCOUNTER — Ambulatory Visit
Admission: RE | Admit: 2020-11-18 | Discharge: 2020-11-18 | Disposition: A | Discharge status: Home / Self Care | Payer: Medicare PPO | Source: Ambulatory Visit | Attending: Infectious Diseases | Admitting: Infectious Diseases

## 2020-11-18 ENCOUNTER — Other Ambulatory Visit: Payer: Self-pay

## 2020-11-18 DIAGNOSIS — Z1231 Encounter for screening mammogram for malignant neoplasm of breast: Secondary | ICD-10-CM

## 2021-01-14 ENCOUNTER — Other Ambulatory Visit: Payer: Self-pay

## 2021-01-14 ENCOUNTER — Encounter: Payer: Self-pay | Admitting: Psychiatry

## 2021-01-14 ENCOUNTER — Ambulatory Visit (INDEPENDENT_AMBULATORY_CARE_PROVIDER_SITE_OTHER): Payer: Medicare PPO | Admitting: Psychiatry

## 2021-01-14 VITALS — BP 117/74 | HR 73 | Temp 97.6°F | Wt 145.0 lb

## 2021-01-14 DIAGNOSIS — F3176 Bipolar disorder, in full remission, most recent episode depressed: Secondary | ICD-10-CM | POA: Diagnosis not present

## 2021-01-14 DIAGNOSIS — Z634 Disappearance and death of family member: Secondary | ICD-10-CM

## 2021-01-14 DIAGNOSIS — F5105 Insomnia due to other mental disorder: Secondary | ICD-10-CM

## 2021-01-14 MED ORDER — ARIPIPRAZOLE 2 MG PO TABS: 2.0000 mg | ORAL_TABLET | Freq: Every day | ORAL | 1 refills | Status: DC

## 2021-01-14 MED ORDER — HYDROXYZINE HCL 10 MG PO TABS: 10.0000 mg | ORAL_TABLET | Freq: Every day | ORAL | 1 refills | Status: DC | PRN

## 2021-01-14 NOTE — Patient Instructions (Addendum)
Hydroxyzine Capsules or Tablets What is this medication? HYDROXYZINE (hye DROX i zeen) treats the symptoms of allergies and allergic reactions. It may also be used to treat anxiety or cause drowsiness before a procedure. It works by blocking histamine, a substance released by the body during an allergic reaction. It belongs to a group of medications called antihistamines. This medicine may be used for other purposes; ask your health care provider or pharmacist if you have questions. COMMON BRAND NAME(S): ANX, Atarax, Rezine, Vistaril What should I tell my care team before I take this medication? They need to know if you have any of these conditions: Glaucoma Heart disease History of irregular heartbeat Kidney disease Liver disease Lung or breathing disease, like asthma Stomach or intestine problems Thyroid disease Trouble passing urine An unusual or allergic reaction to hydroxyzine, cetirizine, other medications, foods, dyes or preservatives Pregnant or trying to get pregnant Breast-feeding How should I use this medication? Take this medication by mouth with a full glass of water. Follow the directions on the prescription label. You may take this medication with food or on an empty stomach. Take your medication at regular intervals. Do not take your medication more often than directed. Talk to your care team regarding the use of this medication in children. Special care may be needed. While this medication may be prescribed for children as young as 6 years of age for selected conditions, precautions do apply. Patients over 65 years old may have a stronger reaction and need a smaller dose. Overdosage: If you think you have taken too much of this medicine contact a poison control center or emergency room at once. NOTE: This medicine is only for you. Do not share this medicine with others. What if I miss a dose? If you miss a dose, take it as soon as you can. If it is almost time for your next  dose, take only that dose. Do not take double or extra doses. What may interact with this medication? Do not take this medication with any of the following: Cisapride Dronedarone Pimozide Thioridazine This medication may also interact with the following: Alcohol Antihistamines for allergy, cough, and cold Atropine Barbiturate medications for sleep or seizures, like phenobarbital Certain antibiotics like erythromycin or clarithromycin Certain medications for anxiety or sleep Certain medications for bladder problems like oxybutynin, tolterodine Certain medications for depression or psychotic disturbances Certain medications for irregular heart beat Certain medications for Parkinson's disease like benztropine, trihexyphenidyl Certain medications for seizures like phenobarbital, primidone Certain medications for stomach problems like dicyclomine, hyoscyamine Certain medications for travel sickness like scopolamine Ipratropium Narcotic medications for pain Other medications that prolong the QT interval (which can cause an abnormal heart rhythm) like dofetilide This list may not describe all possible interactions. Give your health care provider a list of all the medicines, herbs, non-prescription drugs, or dietary supplements you use. Also tell them if you smoke, drink alcohol, or use illegal drugs. Some items may interact with your medicine. What should I watch for while using this medication? Tell your care team if your symptoms do not improve. You may get drowsy or dizzy. Do not drive, use machinery, or do anything that needs mental alertness until you know how this medication affects you. Do not stand or sit up quickly, especially if you are an older patient. This reduces the risk of dizzy or fainting spells. Alcohol may interfere with the effect of this medication. Avoid alcoholic drinks. Your mouth may get dry. Chewing sugarless gum or sucking hard   candy, and drinking plenty of water may  help. Contact your care team if the problem does not go away or is severe. This medication may cause dry eyes and blurred vision. If you wear contact lenses you may feel some discomfort. Lubricating drops may help. See your eye care specialist if the problem does not go away or is severe. If you are receiving skin tests for allergies, tell your care team you are using this medication. What side effects may I notice from receiving this medication? Side effects that you should report to your care team as soon as possible: Allergic reactions--skin rash, itching, hives, swelling of the face, lips, tongue, or throat Heart rhythm changes--fast or irregular heartbeat, dizziness, feeling faint or lightheaded, chest pain, trouble breathing Side effects that usually do not require medical attention (report to your care team if they continue or are bothersome): Confusion Drowsiness Dry mouth Hallucinations Headache This list may not describe all possible side effects. Call your doctor for medical advice about side effects. You may report side effects to FDA at 1-800-FDA-1088. Where should I keep my medication? Keep out of the reach of children and pets. Store at room temperature between 15 and 30 degrees C (59 and 86 degrees F). Keep container tightly closed. Throw away any unused medication after the expiration date. NOTE: This sheet is a summary. It may not cover all possible information. If you have questions about this medicine, talk to your doctor, pharmacist, or health care provider.  2022 Elsevier/Gold Standard (2020-04-24 00:00:00) Managing Loss, Adult People experience loss in many different ways throughout their lives. Events such as moving, changing jobs, and losing friends can create a sense of loss. The loss may be as serious as a major health change, divorce, death of a pet, or death of a loved one. All of these types of loss are likely to create a physical and emotional reaction known as  grief. Grief is the result of a major change or an absence of something or someone that you count on. Grief is a normal reaction to loss. A variety of factors can affect your grieving experience, including: The nature of your loss. Your relationship to what or whom you lost. Your understanding of grief and how to manage it. Your support system. Be aware that when grief becomes extreme, it can lead to more severe issues like isolation, depression, anxiety, or suicidal thoughts. Talk with your health care provider if you have any of these issues. How to manage lifestyle changes Keep to your normal routine as much as possible. If you have trouble focusing or doing normal activities, it is acceptable to take some time away from your normal routine. Spend time with friends and loved ones. Eat a healthy diet, get plenty of sleep, and rest when you feel tired. How to recognize changes  The way that you deal with your grief will affect your ability to function as you normally do. When grieving, you may experience these changes: Numbness, shock, sadness, anxiety, anger, denial, and guilt. Thoughts about death. Unexpected crying. A physical sensation of emptiness in your stomach. Problems sleeping and eating. Tiredness (fatigue). Loss of interest in normal activities. Dreaming about or imagining seeing the person who died. A need to remember what or whom you lost. Difficulty thinking about anything other than your loss for a period of time. Relief. If you have been expecting the loss for a while, you may feel a sense of relief when it happens. Follow these instructions at  home: Activity Express your feelings in healthy ways, such as: Talking with others about your loss. It may be helpful to find others who have had a similar loss, such as a support group. Writing down your feelings in a journal. Doing physical activities to release stress and emotional energy. Doing creative activities like  painting, sculpting, or playing or listening to music. Practicing resilience. This is the ability to recover and adjust after facing challenges. Reading some resources that encourage resilience may help you to learn ways to practice those behaviors.  General instructions Be patient with yourself and others. Allow the grieving process to happen, and remember that grieving takes time. It is likely that you may never feel completely done with some grief. You may find a way to move on while still cherishing memories and feelings about your loss. Accepting your loss is a process. It can take months or longer to adjust. Keep all follow-up visits. This is important. Where to find support To get support for managing loss: Ask your health care provider for help and recommendations, such as grief counseling or therapy. Think about joining a support group for people who are managing a loss. Where to find more information You can find more information about managing loss from: American Society of Clinical Oncology: www.cancer.net American Psychological Association: [employer] Contact a health care provider if: Your grief is extreme and keeps getting worse. You have ongoing grief that does not improve. Your body shows symptoms of grief, such as illness. You feel depressed, anxious, or hopeless. Get help right away if: You have thoughts about hurting yourself or others. Get help right away if you feel like you may hurt yourself or others, or have thoughts about taking your own life. Go to your nearest emergency room or: Call 911. Call the National Suicide Prevention Lifeline at 403-175-0820 or 988. This is open 24 hours a day. Text the Crisis Text Line at (562) 219-8858. Summary Grief is the result of a major change or an absence of someone or something that you count on. Grief is a normal reaction to loss. The depth of grief and the period of recovery depend on the type of loss and your ability to adjust  to the change and process your feelings. Processing grief requires patience and a willingness to accept your feelings and talk about your loss with people who are supportive. It is important to find resources that work for you and to realize that people experience grief differently. There is not one grieving process that works for everyone in the same way. Be aware that when grief becomes extreme, it can lead to more severe issues like isolation, depression, anxiety, or suicidal thoughts. Talk with your health care provider if you have any of these issues. This information is not intended to replace advice given to you by your health care provider. Make sure you discuss any questions you have with your health care provider. Document Revised: 09/30/2020 Document Reviewed: 09/30/2020 Elsevier Patient Education  2022 [employer].

## 2021-01-14 NOTE — Progress Notes (Signed)
BH MD OP Progress Note  01/14/2021 11:28 AM Adriana Vasquez  MRN:  [id number]  Chief Complaint:  Chief Complaint   Follow-up    HPI: Adriana Vasquez is a 75 year old Caucasian female, lives in [place], retired, widowed, has a history of bipolar disorder, insomnia was evaluated in office today.  Patient today reports she is currently grieving the loss of her aunt who passed away in 2022/12/14.  Her aunt was 14 years old.  She reports she was very close to her.  Patient became tearful when she discussed this.  She reports she has been having days when she has been tearful.  Otherwise she reports overall she has been doing okay.  She has been taking care of herself.  Reports appetite as increased at times, likely due to her Abilify.  She was advised to take a lower dose of Abilify last visit however since she started having crying episodes after the death of her aunt she increased it back to Abilify 2 mg and reports that has been helpful.  Patient reports sleep is overall okay.  She currently does not take the trazodone.  Patient looks forward to Thanksgiving and Christmas holidays.  Plans to spend it with her family.  Patient denies any suicidality, homicidality or perceptual disturbances.  Patient denies any other concerns today.  Visit Diagnosis:    ICD-10-CM   1. Bipolar disorder, in full remission, most recent episode depressed (HCC)  F31.76 ARIPiprazole (ABILIFY) 2 MG tablet    2. Insomnia due to mental disorder  F51.05    mood    3. Bereavement  Z63.4 hydrOXYzine (ATARAX/VISTARIL) 10 MG tablet      Past Psychiatric History: Reviewed past psychiatric history from progress note on 03/22/2019.  Past trials of Geodon, Abilify, trazodone, hydroxyzine, BuSpar  Past Medical History:  Past Medical History:  Diagnosis Date   Bipolar disorder (HCC)    Personal history of tobacco use, presenting hazards to health 01/22/2015    Past Surgical History:  Procedure Laterality Date    ABDOMINAL HYSTERECTOMY     HAND SURGERY Right 2001   Patient not sure of what exactly was done.  Repared index finger knuckle area.    Family Psychiatric History: Reviewed family psychiatric history from progress note on 03/22/2019  Family History:  Family History  Problem Relation Age of Onset   Arthritis Mother    Heart attack Father    Diabetes Father    Bipolar disorder Father    Breast cancer Neg Hx     Social History: Reviewed social history from progress note on 03/22/2019 Social History   Socioeconomic History   Marital status: Widowed    Spouse name: Not on file   Number of children: Not on file   Years of education: Not on file   Highest education level: Not on file  Occupational History   Not on file  Tobacco Use   Smoking status: Former    Packs/day: 1.00    Years: 35.00    Pack years: 35.00    Types: Cigarettes    Start date: 09/27/1979   Smokeless tobacco: Never   Tobacco comments:    03/26/2018  Vaping Use   Vaping Use: Never used  Substance and Sexual Activity   Alcohol use: No    Alcohol/week: 0.0 standard drinks   Drug use: No   Sexual activity: Not Currently  Other Topics Concern   Not on file  Social History Narrative   Not on file  Social Determinants of Health   Financial Resource Strain: Not on file  Food Insecurity: Not on file  Transportation Needs: Not on file  Physical Activity: Not on file  Stress: Not on file  Social Connections: Not on file    Allergies: No Known Allergies  Metabolic Disorder Labs: Lab Results  Component Value Date   HGBA1C 5.7 (H) 04/10/2020   MPG 117 04/10/2020   Lab Results  Component Value Date   PROLACTIN 20.3 04/10/2020   PROLACTIN 26.7 (H) 02/07/2015   Lab Results  Component Value Date   CHOL 218 (H) 12/07/2014   TRIG 153 (H) 12/07/2014   HDL 60 12/07/2014   LDLCALC 127 (H) 12/07/2014   No results found for: TSH  Therapeutic Level Labs: No results found for: LITHIUM No results found  for: VALPROATE No components found for:  CBMZ  Current Medications: Current Outpatient Medications  Medication Sig Dispense Refill   busPIRone (BUSPAR) 10 MG tablet Take 1 tablet (10 mg total) by mouth 3 (three) times daily. 270 tablet 2   Calcium Carbonate-Vitamin D 600-400 MG-UNIT per tablet Take by mouth.     gabapentin (NEURONTIN) 100 MG capsule Take 2 capsules by mouth 3 (three) times daily.     hydrOXYzine (ATARAX/VISTARIL) 10 MG tablet Take 1-2 tablets (10-20 mg total) by mouth daily as needed. For severe anxiety, agitation 15 tablet 1   ARIPiprazole (ABILIFY) 2 MG tablet Take 1 tablet (2 mg total) by mouth daily. Dose increase 90 tablet 1   No current facility-administered medications for this visit.     Musculoskeletal: Strength & Muscle Tone: within normal limits Gait & Station: normal Patient leans: N/A  Psychiatric Specialty Exam: Review of Systems  Psychiatric/Behavioral:         Grieving  All other systems reviewed and are negative.  Blood pressure 117/74, pulse 73, temperature 97.6 F (36.4 C), temperature source Temporal, weight 145 lb (65.8 kg).Body mass index is 28.32 kg/m.  General Appearance: Casual  Eye Contact:  Fair  Speech:  Clear and Coherent  Volume:  Normal  Mood:   Grieving  Affect:  Tearful  Thought Process:  Goal Directed and Descriptions of Associations: Intact  Orientation:  Full (Time, Place, and Person)  Thought Content: Logical   Suicidal Thoughts:  No  Homicidal Thoughts:  No  Memory:  Immediate;   Fair Recent;   Fair Remote;   limited  Judgement:  Fair  Insight:  Fair  Psychomotor Activity:  Normal  Concentration:  Concentration: Fair and Attention Span: Fair  Recall:  [employer] of Knowledge: Fair  Language: Fair  Akathisia:  No  Handed:  Right  AIMS (if indicated): done  Assets:  Engineer, maintenance Social Support Transportation  ADL's:  Intact  Cognition: WNL  Sleep:  Fair   Screenings: Retail banker Office Visit from 01/14/2021 in [facility] Office Visit from 10/04/2017 in [facility] Office Visit from 12/14/2016 in [facility] Office Visit from 06/19/2015 in [facility]  AIMS Total Score 0 0 0 0      GAD-7    Flowsheet Row Office Visit from 08/13/2020 in [facility]  Total GAD-7 Score 2      PHQ2-9    Flowsheet Row Office Visit from 01/14/2021 in [facility] Office Visit from 08/13/2020 in [facility] Video Visit from 07/01/2020 in [facility] Video Visit from  06/11/2020 in [facility] Video Visit from 04/09/2020 in [facility]  PHQ-2 Total Score 0 0 1 2 0  PHQ-9 Total Score 4 2 -- 10 --      Flowsheet Row Office Visit from 01/14/2021 in [facility] Office Visit from 08/13/2020 in [facility] Video Visit from 06/11/2020 in [facility]  C-SSRS RISK CATEGORY No Risk No Risk No Risk        Assessment and Plan: RHYDER BRATZ is a 75 year old Caucasian female, widowed, retired, lives in [place], has a history of bipolar disorder was evaluated in office today.  Patient is currently grieving the loss of her aunt, discussed plan as noted below.  Plan Bipolar disorder in remission Abilify 2 mg p.o. daily, patient restarted taking the higher dosage recently. BuSpar 10 mg p.o. 3 times daily  Insomnia-stable We will monitor closely.  Patient is currently not taking the trazodone or melatonin.  Bereavement-unstable Start hydroxyzine 10 to 20 mg p.o. daily as needed for severe mood symptoms.  Patient to limit use. Discussed referral for counseling, she will let writer know if she is interested. Patient to make use  of her social support system.  Follow-up in clinic in 4 weeks or sooner in person.  This note was generated in part or whole with voice recognition software. Voice recognition is usually quite accurate but there are transcription errors that can and very often do occur. I apologize for any typographical errors that were not detected and corrected.      Jomarie Longs, MD 01/14/2021, 11:28 AM

## 2021-02-13 ENCOUNTER — Ambulatory Visit: Payer: Medicare PPO | Admitting: Psychiatry

## 2021-03-06 ENCOUNTER — Other Ambulatory Visit: Payer: Self-pay

## 2021-03-06 ENCOUNTER — Telehealth (INDEPENDENT_AMBULATORY_CARE_PROVIDER_SITE_OTHER): Payer: Medicare PPO | Admitting: Psychiatry

## 2021-03-06 ENCOUNTER — Encounter: Payer: Self-pay | Admitting: Psychiatry

## 2021-03-06 DIAGNOSIS — Z634 Disappearance and death of family member: Secondary | ICD-10-CM | POA: Diagnosis not present

## 2021-03-06 DIAGNOSIS — F3176 Bipolar disorder, in full remission, most recent episode depressed: Secondary | ICD-10-CM

## 2021-03-06 DIAGNOSIS — F5105 Insomnia due to other mental disorder: Secondary | ICD-10-CM | POA: Diagnosis not present

## 2021-03-06 NOTE — Progress Notes (Signed)
Virtual Visit via Telephone Note  I connected with Adriana Vasquez on 03/06/21 at  2:00 PM EST by telephone and verified that I am speaking with the correct person using two identifiers.  Location Provider Location : [facility] Patient Location : Home  Participants: Patient , Provider   I discussed the limitations, risks, security and privacy concerns of performing an evaluation and management service by telephone and the availability of in person appointments. I also discussed with the patient that there may be a patient responsible charge related to this service. The patient expressed understanding and agreed to proceed.   I discussed the assessment and treatment plan with the patient. The patient was provided an opportunity to ask questions and all were answered. The patient agreed with the plan and demonstrated an understanding of the instructions.   The patient was advised to call back or seek an in-person evaluation if the symptoms worsen or if the condition fails to improve as anticipated.  BH MD OP Progress Note  03/06/2021 2:30 PM Adriana Vasquez  MRN:  [id number]  Chief Complaint:  Chief Complaint   Follow-up; Anxiety    HPI: Adriana Vasquez is a 76 year old Caucasian female, lives in [place], retired, widowed, has a history of bipolar disorder, insomnia was evaluated by telemedicine today.  Patient today reports she is currently coping with her grief better than before.  She however does report relationship struggles with her cousin.  Patient reports she understands she needs to be more assertive with her and is trying to work on that.  Patient otherwise denies any significant mood lability, denies depression or hypomanic symptoms or manic symptoms.  Patient reports sleep as good.  Denies any suicidality, homicidality or perceptual disturbances.  Patient reports she is compliant on her medications.  Denies side effects.  Patient denies any other concerns today.  Visit  Diagnosis:    ICD-10-CM   1. Bipolar disorder, in full remission, most recent episode depressed (HCC)  F31.76     2. Insomnia due to mental disorder  F51.05    mood    3. Bereavement  Z63.4       Past Psychiatric History: Reviewed past psychiatric history from progress note on 03/22/2019.  Past trials of Geodon, Abilify, trazodone, hydroxyzine, BuSpar  Past Medical History:  Past Medical History:  Diagnosis Date   Bipolar disorder (HCC)    Personal history of tobacco use, presenting hazards to health 01/22/2015    Past Surgical History:  Procedure Laterality Date   ABDOMINAL HYSTERECTOMY     HAND SURGERY Right 2001   Patient not sure of what exactly was done.  Repared index finger knuckle area.    Family Psychiatric History: Reviewed family psychiatric history from progress note on 03/22/2019.  Family History:  Family History  Problem Relation Age of Onset   Arthritis Mother    Heart attack Father    Diabetes Father    Bipolar disorder Father    Breast cancer Neg Hx     Social History: Reviewed social history from progress note on 03/22/2019. Social History   Socioeconomic History   Marital status: Widowed    Spouse name: Not on file   Number of children: Not on file   Years of education: Not on file   Highest education level: Not on file  Occupational History   Not on file  Tobacco Use   Smoking status: Former    Packs/day: 1.00    Years: 35.00    Pack years:  35.00    Types: Cigarettes    Start date: 09/27/1979   Smokeless tobacco: Never   Tobacco comments:    03/26/2018  Vaping Use   Vaping Use: Never used  Substance and Sexual Activity   Alcohol use: No    Alcohol/week: 0.0 standard drinks   Drug use: No   Sexual activity: Not Currently  Other Topics Concern   Not on file  Social History Narrative   Not on file   Social Determinants of Health   Financial Resource Strain: Not on file  Food Insecurity: Not on file  Transportation Needs: Not on  file  Physical Activity: Not on file  Stress: Not on file  Social Connections: Not on file    Allergies: No Known Allergies  Metabolic Disorder Labs: Lab Results  Component Value Date   HGBA1C 5.7 (H) 04/10/2020   MPG 117 04/10/2020   Lab Results  Component Value Date   PROLACTIN 20.3 04/10/2020   PROLACTIN 26.7 (H) 02/07/2015   Lab Results  Component Value Date   CHOL 218 (H) 12/07/2014   TRIG 153 (H) 12/07/2014   HDL 60 12/07/2014   LDLCALC 127 (H) 12/07/2014   No results found for: TSH  Therapeutic Level Labs: No results found for: LITHIUM No results found for: VALPROATE No components found for:  CBMZ  Current Medications: Current Outpatient Medications  Medication Sig Dispense Refill   ARIPiprazole (ABILIFY) 2 MG tablet Take 1 tablet (2 mg total) by mouth daily. Dose increase 90 tablet 1   busPIRone (BUSPAR) 10 MG tablet Take 1 tablet (10 mg total) by mouth 3 (three) times daily. 270 tablet 2   Calcium Carbonate-Vitamin D 600-400 MG-UNIT per tablet Take by mouth.     gabapentin (NEURONTIN) 100 MG capsule Take 2 capsules by mouth 3 (three) times daily.     hydrOXYzine (ATARAX/VISTARIL) 10 MG tablet Take 1-2 tablets (10-20 mg total) by mouth daily as needed. For severe anxiety, agitation 15 tablet 1   No current facility-administered medications for this visit.     Musculoskeletal: Strength & Muscle Tone:  UTA Gait & Station:  Seated Patient leans: N/A  Psychiatric Specialty Exam: Review of Systems  Psychiatric/Behavioral:  The patient is nervous/anxious.   All other systems reviewed and are negative.  There were no vitals taken for this visit.There is no height or weight on file to calculate BMI.  General Appearance:  UTA  Eye Contact:   UTA  Speech:  Clear and Coherent  Volume:  Normal  Mood:  Anxious  Affect:   UTA  Thought Process:  Goal Directed and Descriptions of Associations: Intact  Orientation:  Full (Time, Place, and Person)  Thought  Content: Logical   Suicidal Thoughts:  No  Homicidal Thoughts:  No  Memory:  Immediate;   Fair Recent;   Fair Remote;   Limited  Judgement:  Fair  Insight:  Fair  Psychomotor Activity:   UTA  Concentration:  Concentration: Fair and Attention Span: Fair  Recall:  [employer] of Knowledge: Fair  Language: Fair  Akathisia:  No  Handed:  Right  AIMS (if indicated): not done  Assets:  Communication Skills Desire for Improvement Housing Social Support  ADL's:  Intact  Cognition: WNL  Sleep:  Fair   Screenings: AIMS    Flowsheet Row Office Visit from [facility]/14/2021 in [facility] Office Visit from 10/04/2017 in [facility] Office Visit from 12/14/2016 in [facility] Office Visit from  [facility]2015 [facility]  AIMS Total Score 0 0 0 0      GAD-7    Flowsheet Row Office Visit from [facility]/13/2020 in [facility]  Total GAD-7 Score 2      PHQ2-9    Flowsheet Row Video Visit from 03/06/2021 in [facility] Office Visit from [facility]/14[facility]2021 in [facility] Office Visit from [facility]/13/2020 [facility] Video Visit from 07/01/2020 in [facility] Video Visit from [facility]/11[facility]  PHQ-2 Total Score 0 0 0 1 2  PHQ-9 Total Score -- 4 2 -- 10      Flowsheet Row Office Visit from 01/14/2021 [facility] Office Visit from 08/13/2020 in [facility] Video Visit from [facility]/11[facility]2020 [facility]  C-SSRS RISK CATEGORY No Risk No Risk No Risk        Assessment and Plan: Adriana Vasquez is a 76 year old Caucasian female, widowed, retired, lives in [place], has a history of bipolar disorder was evaluated by telemedicine today.  Patient  is currently struggling with relationship struggles, will benefit from the following plan.  Plan Bipolar disorder in remission Abilify 2 mg p.o. daily. BuSpar 10 mg p.o. 3 times daily.  Insomnia-stable Continue sleep hygiene techniques.  Bereavement-improving Hydroxyzine 10 to 20 mg p.o. daily as needed for severe anxiety attacks Discussed referral for CBT.  Patient with relationship struggles, discussed referral for CBT, she will let writer know if she is interested.   I have spent at least 14 minutes non face to face with patient today.    Follow-up in clinic in 2 months or sooner in person.  This note was generated in part or whole with voice recognition software. Voice recognition is usually quite accurate but there are transcription errors that can and very often do occur. I apologize for any typographical errors that were not detected and corrected.     Adriana LongsSaramma Dalia Jollie, MD 03/07/2021, 9:10 AM

## 2021-05-05 ENCOUNTER — Encounter: Payer: Self-pay | Admitting: Psychiatry

## 2021-05-05 ENCOUNTER — Ambulatory Visit (INDEPENDENT_AMBULATORY_CARE_PROVIDER_SITE_OTHER): Payer: Medicare PPO | Admitting: Psychiatry

## 2021-05-05 ENCOUNTER — Other Ambulatory Visit: Payer: Self-pay | Admitting: Psychiatry

## 2021-05-05 ENCOUNTER — Other Ambulatory Visit: Payer: Self-pay

## 2021-05-05 VITALS — BP 124/75 | HR 63 | Temp 98.1°F | Wt 144.8 lb

## 2021-05-05 DIAGNOSIS — F5105 Insomnia due to other mental disorder: Secondary | ICD-10-CM | POA: Diagnosis not present

## 2021-05-05 DIAGNOSIS — F3176 Bipolar disorder, in full remission, most recent episode depressed: Secondary | ICD-10-CM

## 2021-05-05 DIAGNOSIS — Z634 Disappearance and death of family member: Secondary | ICD-10-CM

## 2021-05-05 MED ORDER — BENZTROPINE MESYLATE 0.5 MG PO TABS: 0.5000 mg | ORAL_TABLET | Freq: Every evening | ORAL | 1 refills | Status: DC | PRN

## 2021-05-05 MED ORDER — HYDROXYZINE HCL 10 MG PO TABS: 10.0000 mg | ORAL_TABLET | Freq: Every day | ORAL | 1 refills | Status: DC | PRN

## 2021-05-05 NOTE — Progress Notes (Signed)
BH MD OP Progress Note  05/05/2021 1:07 PM Adriana Vasquez  MRN:  [id number]  Chief Complaint:  Chief Complaint  Patient presents with   Follow-up: 76 year old Caucasian female with history of bipolar disorder, insomnia, bereavement was evaluated in office today.   HPI: Adriana Vasquez is a 76 year old Caucasian female, lives in [place], retired, widowed, has a history of bipolar disorder, insomnia was evaluated in office today.  Patient today reports she is currently grieving the loss of her husband who passed away 10 years ago.  It is his anniversary coming up in April.  Patient became tearful when she discussed this.  She however reports she does have good support system from a cousin and also her brother.  Otherwise overall mood symptoms are stable.  She does have good days and bad days however she is currently coping well.  Reports sleep is restless some nights however it does not happen often.  Patient does feel restless at night with her lower extremities, and is currently on gabapentin.  That does not help much.  Unknown if this is a side effect of the Abilify or not.  Agreeable to a trial of Cogentin.  Denies suicidality, homicidality or perceptual disturbances.  Patient is compliant on medications.  Does report dry mouth unknown if this is medication induced.   Patient denies any other concerns today.  Visit Diagnosis:    ICD-10-CM   1. Bipolar disorder, in full remission, most recent episode depressed (HCC)  F31.76 DISCONTINUED: benztropine (COGENTIN) 0.5 MG tablet    2. Insomnia due to mental disorder  F51.05    mood    3. Bereavement  Z63.4 hydrOXYzine (ATARAX) 10 MG tablet      Past Psychiatric History: Reviewed past psychiatric history from progress note on 03/22/2019.  Past trials of Geodon, Abilify, trazodone, hydroxyzine, BuSpar . Past Medical History:  Past Medical History:  Diagnosis Date   Bipolar disorder [facility])    Personal history of tobacco use,  presenting hazards to health 01/22/2015    Past Surgical History:  Procedure Laterality Date   ABDOMINAL HYSTERECTOMY     HAND SURGERY Right 2001   Patient not sure of what exactly was done.  Repared index finger knuckle area.    Family Psychiatric History: Reviewed family psychiatric history from progress note from 03/22/2019.  Family History:  Family History  Problem Relation Age of Onset   Arthritis Mother    Heart attack Father    Diabetes Father    Bipolar disorder Father    Breast cancer Neg Hx     Social History: Reviewed social history from progress note on 03/22/2019. Social History   Socioeconomic History   Marital status: Widowed    Spouse name: Not on file   Number of children: Not on file   Years of education: Not on file   Highest education level: Not on file  Occupational History   Not on file  Tobacco Use   Smoking status: Former    Packs/day: 1.00    Years: 35.00    Pack years: 35.00    Types: Cigarettes    Start date: 09/27/1979   Smokeless tobacco: Never   Tobacco comments:    03/26/2018  Vaping Use   Vaping Use: Never used  Substance and Sexual Activity   Alcohol use: No    Alcohol/week: 0.0 standard drinks   Drug use: No   Sexual activity: Not Currently  Other Topics Concern   Not on file  Social History  Narrative   Not on file   Social Determinants of Health   Financial Resource Strain: Not on file  Food Insecurity: Not on file  Transportation Needs: Not on file  Physical Activity: Not on file  Stress: Not on file  Social Connections: Not on file    Allergies: No Known Allergies  Metabolic Disorder Labs: Lab Results  Component Value Date   HGBA1C 5.7 (H) 04/10/2020   MPG 117 04/10/2020   Lab Results  Component Value Date   PROLACTIN 20.3 04/10/2020   PROLACTIN 26.7 (H) 02/07/2015   Lab Results  Component Value Date   CHOL 218 (H) 12/07/2014   TRIG 153 (H) 12/07/2014   HDL 60 12/07/2014   LDLCALC 127 (H) 12/07/2014    No results found for: TSH  Therapeutic Level Labs: No results found for: LITHIUM No results found for: VALPROATE No components found for:  CBMZ  Current Medications: Current Outpatient Medications  Medication Sig Dispense Refill   ARIPiprazole (ABILIFY) 2 MG tablet Take 1 tablet (2 mg total) by mouth daily. Dose increase 90 tablet 1   busPIRone (BUSPAR) 10 MG tablet Take 1 tablet (10 mg total) by mouth 3 (three) times daily. 270 tablet 2   Calcium Carbonate-Vitamin D 600-400 MG-UNIT per tablet Take by mouth.     gabapentin (NEURONTIN) 100 MG capsule Take 2 capsules by mouth 3 (three) times daily.     benztropine (COGENTIN) 0.5 MG tablet TAKE 1 TABLET BY MOUTH AT BEDTIME AS NEEDED FOR TREMORS, RESTLESSNESS, OR ABNORMAL MOVEMENTS 90 tablet 0   hydrOXYzine (ATARAX) 10 MG tablet Take 1-2 tablets (10-20 mg total) by mouth daily as needed. For severe anxiety, agitation 15 tablet 1   No current facility-administered medications for this visit.     Musculoskeletal: Strength & Muscle Tone: within normal limits Gait & Station: normal Patient leans: N/A  Psychiatric Specialty Exam: Review of Systems  Musculoskeletal:  Positive for arthralgias.  Psychiatric/Behavioral:  Positive for sleep disturbance.        Grieving  All other systems reviewed and are negative.  Blood pressure 124/75, pulse 63, temperature 98.1 F (36.7 C), temperature source Temporal, weight 144 lb 12.8 oz (65.7 kg).Body mass index is 28.28 kg/m.  General Appearance: Casual  Eye Contact:  Good  Speech:  Clear and Coherent  Volume:  Normal  Mood:   Grieving  Affect:  Congruent  Thought Process:  Goal Directed and Descriptions of Associations: Intact  Orientation:  Full (Time, Place, and Person)  Thought Content: Logical   Suicidal Thoughts:  No  Homicidal Thoughts:  No  Memory:  Immediate;   Fair Recent;   Fair Remote;   Fair  Judgement:  Fair  Insight:  Fair  Psychomotor Activity:  Normal  Concentration:   Concentration: Fair and Attention Span: Fair  Recall:  [employer]  Fund of Knowledge: Fair  Language: Fair  Akathisia:  No  Handed:  Right  AIMS (if indicated): done  Assets:  Communication Skills Desire for Improvement Housing Social Support Transportation  ADL's:  Intact  Cognition: WNL  Sleep:   restless at times due to leg cramps   Screenings: AIMS    Flowsheet Row Office Visit from [facility]05[facility]2021 in [facility] Office Visit from [facility]/14[facility]20[facility] Office Visit from [facility] Office Visit from 12/14/2016 in [facility] Office Visit from 06/19/2015 [facility]  AIMS Total Score 0 0 0 0 0  GAD-7    Flowsheet Row Office Visit from 08/13/2020 in [facility]  Total GAD-7 Score 2      PHQ2-9    Flowsheet Row Office Visit from 05/05/2021 in [facility] Video Visit from 03/06/2021 in [facility] Office Visit from 01/14/2021 in [facility] Office Visit from 08/13/2020 in [facility] Video Visit from 07/01/2020 in [facility]  PHQ-2 Total Score 1 0 0 0 1  PHQ-9 Total Score 3 -- 4 2 --      Flowsheet Row Office Visit from 01/14/2021 in [facility] Office Visit from 08/13/2020 in [facility] Video Visit from 06/11/2020 in [facility]  C-SSRS RISK CATEGORY No Risk No Risk No Risk        Assessment and Plan: Adriana Vasquez is a 76 year old Caucasian female, widowed, retired, lives in [place], has a history of bipolar disorder was evaluated in office today.  Patient is currently grieving her husband as well as does report leg cramps likely due to arthralgia versus side  effect of Abilify.  Discussed plan as noted below.  Plan Bipolar disorder in remission Abilify 2 mg p.o. daily BuSpar 10 mg p.o. 3 times daily. Discussed Biotene for dry mouth, she will get it over-the-counter.  Insomnia-restless Continue sleep hygiene techniques Start Cogentin 0.5 mg at bedtime as needed for leg cramps.  Provided medication education.  Bereavement-unstable Provided counseling and support, being the death anniversary of her husband coming up. Discussed referral for CBT in the past however she is not interested.  Follow-up in clinic in 2 to 3 months or sooner if needed.   Consent: Patient/Guardian gives verbal consent for treatment and assignment of benefits for services provided during this visit. Patient/Guardian expressed understanding and agreed to proceed.   This note was generated in part or whole with voice recognition software. Voice recognition is usually quite accurate but there are transcription errors that can and very often do occur. I apologize for any typographical errors that were not detected and corrected.      Jomarie Longs, MD 05/05/2021, 1:07 PM

## 2021-05-06 ENCOUNTER — Other Ambulatory Visit: Payer: Self-pay

## 2021-05-06 DIAGNOSIS — Z87891 Personal history of nicotine dependence: Secondary | ICD-10-CM

## 2021-06-03 ENCOUNTER — Ambulatory Visit
Admission: RE | Admit: 2021-06-03 | Discharge: 2021-06-03 | Disposition: A | Discharge status: Home / Self Care | Payer: Medicare PPO | Source: Ambulatory Visit | Attending: Acute Care | Admitting: Acute Care

## 2021-06-03 DIAGNOSIS — Z87891 Personal history of nicotine dependence: Secondary | ICD-10-CM | POA: Diagnosis present

## 2021-06-05 ENCOUNTER — Other Ambulatory Visit: Payer: Self-pay

## 2021-06-05 DIAGNOSIS — Z122 Encounter for screening for malignant neoplasm of respiratory organs: Secondary | ICD-10-CM

## 2021-06-05 DIAGNOSIS — Z87891 Personal history of nicotine dependence: Secondary | ICD-10-CM

## 2021-07-09 ENCOUNTER — Telehealth: Payer: Self-pay | Admitting: Psychiatry

## 2021-07-09 ENCOUNTER — Emergency Department: Payer: Medicare PPO

## 2021-07-09 ENCOUNTER — Encounter: Payer: Self-pay | Admitting: Psychiatry

## 2021-07-09 ENCOUNTER — Inpatient Hospital Stay
Admission: EM | Admit: 2021-07-09 | Discharge: 2021-07-14 | DRG: 690 | Disposition: A | Discharge status: Home / Self Care | Payer: Medicare PPO | Source: Ambulatory Visit | Attending: Internal Medicine | Admitting: Internal Medicine

## 2021-07-09 ENCOUNTER — Ambulatory Visit (INDEPENDENT_AMBULATORY_CARE_PROVIDER_SITE_OTHER): Payer: Medicare PPO | Admitting: Psychiatry

## 2021-07-09 ENCOUNTER — Other Ambulatory Visit: Payer: Self-pay

## 2021-07-09 VITALS — BP 116/74 | HR 125 | Temp 97.6°F | Wt 141.0 lb

## 2021-07-09 DIAGNOSIS — F3163 Bipolar disorder, current episode mixed, severe, without psychotic features: Secondary | ICD-10-CM | POA: Insufficient documentation

## 2021-07-09 DIAGNOSIS — F22 Delusional disorders: Principal | ICD-10-CM

## 2021-07-09 DIAGNOSIS — Z833 Family history of diabetes mellitus: Secondary | ICD-10-CM

## 2021-07-09 DIAGNOSIS — I959 Hypotension, unspecified: Secondary | ICD-10-CM | POA: Diagnosis not present

## 2021-07-09 DIAGNOSIS — F319 Bipolar disorder, unspecified: Secondary | ICD-10-CM | POA: Diagnosis present

## 2021-07-09 DIAGNOSIS — F5105 Insomnia due to other mental disorder: Secondary | ICD-10-CM

## 2021-07-09 DIAGNOSIS — E876 Hypokalemia: Secondary | ICD-10-CM | POA: Diagnosis not present

## 2021-07-09 DIAGNOSIS — N3000 Acute cystitis without hematuria: Secondary | ICD-10-CM | POA: Diagnosis not present

## 2021-07-09 DIAGNOSIS — F23 Brief psychotic disorder: Secondary | ICD-10-CM | POA: Diagnosis present

## 2021-07-09 DIAGNOSIS — Z79899 Other long term (current) drug therapy: Secondary | ICD-10-CM

## 2021-07-09 DIAGNOSIS — N390 Urinary tract infection, site not specified: Principal | ICD-10-CM | POA: Diagnosis present

## 2021-07-09 DIAGNOSIS — Z818 Family history of other mental and behavioral disorders: Secondary | ICD-10-CM

## 2021-07-09 DIAGNOSIS — Z8261 Family history of arthritis: Secondary | ICD-10-CM

## 2021-07-09 DIAGNOSIS — F419 Anxiety disorder, unspecified: Secondary | ICD-10-CM | POA: Diagnosis present

## 2021-07-09 DIAGNOSIS — Z87891 Personal history of nicotine dependence: Secondary | ICD-10-CM

## 2021-07-09 DIAGNOSIS — Z8249 Family history of ischemic heart disease and other diseases of the circulatory system: Secondary | ICD-10-CM

## 2021-07-09 HISTORY — DX: Urinary tract infection, site not specified: N39.0

## 2021-07-09 LAB — COMPREHENSIVE METABOLIC PANEL
ALT: 21 U/L (ref 0–44)
AST: 27 U/L (ref 15–41)
Albumin: 4.2 g/dL (ref 3.5–5.0)
Alkaline Phosphatase: 75 U/L (ref 38–126)
Anion gap: 9 (ref 5–15)
BUN: 9 mg/dL (ref 8–23)
CO2: 27 mmol/L (ref 22–32)
Calcium: 9.8 mg/dL (ref 8.9–10.3)
Chloride: 103 mmol/L (ref 98–111)
Creatinine, Ser: 0.67 mg/dL (ref 0.44–1.00)
GFR, Estimated: >60 mL/min (ref >60)
Glucose, Bld: 94 mg/dL (ref 70–99)
Potassium: 3.6 mmol/L (ref 3.5–5.1)
Sodium: 139 mmol/L (ref 135–145)
Total Bilirubin: 0.9 mg/dL (ref 0.3–1.2)
Total Protein: 7.8 g/dL (ref 6.5–8.1)

## 2021-07-09 LAB — URINE DRUG SCREEN, QUALITATIVE (ARMC ONLY)
Amphetamines, Ur Screen: NOT DETECTED
Barbiturates, Ur Screen: NOT DETECTED
Benzodiazepine, Ur Scrn: NOT DETECTED
Cannabinoid 50 Ng, Ur ~~LOC~~: NOT DETECTED
Cocaine Metabolite,Ur ~~LOC~~: NOT DETECTED
MDMA (Ecstasy)Ur Screen: NOT DETECTED
Methadone Scn, Ur: NOT DETECTED
Opiate, Ur Screen: NOT DETECTED
Phencyclidine (PCP) Ur S: NOT DETECTED
Tricyclic, Ur Screen: NOT DETECTED

## 2021-07-09 LAB — ACETAMINOPHEN LEVEL: Acetaminophen (Tylenol), Serum: <10 ug/mL — ABNORMAL LOW (ref 10–30)

## 2021-07-09 LAB — CBC
HCT: 42.9 % (ref 36.0–46.0)
Hemoglobin: 14 g/dL (ref 12.0–15.0)
MCH: 30.6 pg (ref 26.0–34.0)
MCHC: 32.6 g/dL (ref 30.0–36.0)
MCV: 93.9 fL (ref 80.0–100.0)
Platelets: 248 10*3/uL (ref 150–400)
RBC: 4.57 MIL/uL (ref 3.87–5.11)
RDW: 13.2 % (ref 11.5–15.5)
WBC: 8.6 10*3/uL (ref 4.0–10.5)
nRBC: 0 % (ref 0.0–0.2)

## 2021-07-09 LAB — URINALYSIS, ROUTINE W REFLEX MICROSCOPIC
Bacteria, UA: NONE SEEN
Bilirubin Urine: NEGATIVE
Glucose, UA: NEGATIVE mg/dL
Ketones, ur: NEGATIVE mg/dL
Nitrite: NEGATIVE
Protein, ur: NEGATIVE mg/dL
Specific Gravity, Urine: 1.008 (ref 1.005–1.030)
pH: 6 (ref 5.0–8.0)

## 2021-07-09 LAB — ETHANOL: Alcohol, Ethyl (B): <10 mg/dL (ref <10)

## 2021-07-09 LAB — SALICYLATE LEVEL: Salicylate Lvl: <7 mg/dL — ABNORMAL LOW (ref 7.0–30.0)

## 2021-07-09 MED ORDER — ACETAMINOPHEN 325 MG PO TABS
650.0000 mg | ORAL_TABLET | Freq: Four times a day (QID) | ORAL | Status: DC | PRN
  Administered 2021-07-11: 650 mg via ORAL
  Filled 2021-07-09 (×2): qty 2

## 2021-07-09 MED ORDER — ONDANSETRON HCL 4 MG/2ML IJ SOLN
4.0000 mg | Freq: Three times a day (TID) | INTRAMUSCULAR | Status: DC | PRN
Start: 2021-07-09 — End: 2021-07-14

## 2021-07-09 MED ORDER — BUSPIRONE HCL 10 MG PO TABS
10.0000 mg | ORAL_TABLET | Freq: Three times a day (TID) | ORAL | Status: DC
  Administered 2021-07-10 – 2021-07-14 (×15): 10 mg via ORAL
  Filled 2021-07-09 (×15): qty 1

## 2021-07-09 MED ORDER — SODIUM CHLORIDE 0.9 % IV SOLN
2.0000 g | Freq: Once | INTRAVENOUS | Status: AC
  Administered 2021-07-09: 2 g via INTRAVENOUS
  Filled 2021-07-09: qty 20

## 2021-07-09 MED ORDER — HYDROXYZINE HCL 10 MG PO TABS
10.0000 mg | ORAL_TABLET | Freq: Every day | ORAL | Status: DC | PRN
  Administered 2021-07-11 (×2): 10 mg via ORAL
  Administered 2021-07-12: 20 mg via ORAL
  Administered 2021-07-12 – 2021-07-13 (×2): 10 mg via ORAL
  Filled 2021-07-09: qty 2
  Filled 2021-07-09: qty 1
  Filled 2021-07-09 (×6): qty 2

## 2021-07-09 MED ORDER — SODIUM CHLORIDE 0.9 % IV SOLN
1.0000 g | INTRAVENOUS | Status: AC
  Administered 2021-07-10 – 2021-07-11 (×2): 1 g via INTRAVENOUS
  Filled 2021-07-09: qty 10
  Filled 2021-07-09: qty 1

## 2021-07-09 MED ORDER — ARIPIPRAZOLE 2 MG PO TABS
2.0000 mg | ORAL_TABLET | Freq: Every day | ORAL | Status: DC
  Administered 2021-07-10 – 2021-07-13 (×4): 2 mg via ORAL
  Filled 2021-07-09 (×4): qty 1

## 2021-07-09 MED ORDER — OLANZAPINE 5 MG PO TABS
2.5000 mg | ORAL_TABLET | Freq: Every day | ORAL | Status: DC
  Administered 2021-07-10: 2.5 mg via ORAL
  Filled 2021-07-09: qty 1

## 2021-07-09 MED ORDER — GABAPENTIN 300 MG PO CAPS
300.0000 mg | ORAL_CAPSULE | Freq: Three times a day (TID) | ORAL | Status: DC
  Administered 2021-07-10 – 2021-07-14 (×15): 300 mg via ORAL
  Filled 2021-07-09 (×15): qty 1

## 2021-07-09 MED ORDER — OYSTER SHELL CALCIUM/D3 500-5 MG-MCG PO TABS
1.0000 | ORAL_TABLET | Freq: Every day | ORAL | Status: DC
  Administered 2021-07-10 – 2021-07-14 (×5): 1 via ORAL
  Filled 2021-07-09 (×5): qty 1

## 2021-07-09 MED ORDER — OLANZAPINE 5 MG PO TABS: 5.0000 mg | ORAL_TABLET | Freq: Every day | ORAL | Status: DC

## 2021-07-09 MED ORDER — LORAZEPAM 0.5 MG PO TABS
0.5000 mg | ORAL_TABLET | Freq: Once | ORAL | Status: AC
  Administered 2021-07-09: 0.5 mg via ORAL
  Filled 2021-07-09: qty 1

## 2021-07-09 MED ORDER — OLANZAPINE 5 MG PO TBDP
10.0000 mg | ORAL_TABLET | Freq: Once | ORAL | Status: AC
  Administered 2021-07-09: 10 mg via ORAL
  Filled 2021-07-09: qty 2

## 2021-07-09 MED ORDER — ENOXAPARIN SODIUM 40 MG/0.4ML IJ SOSY
40.0000 mg | PREFILLED_SYRINGE | INTRAMUSCULAR | Status: DC
  Administered 2021-07-10 – 2021-07-14 (×5): 40 mg via SUBCUTANEOUS
  Filled 2021-07-09 (×5): qty 0.4

## 2021-07-09 MED ORDER — BENZTROPINE MESYLATE 0.5 MG PO TABS
0.5000 mg | ORAL_TABLET | Freq: Two times a day (BID) | ORAL | Status: DC | PRN
  Filled 2021-07-09: qty 1

## 2021-07-09 NOTE — ED Notes (Signed)
Patient laying in bed covered with blankets. No distress noted at this time. ?

## 2021-07-09 NOTE — ED Triage Notes (Addendum)
Pt comes pov with not sleeping and states she is scared of her neighbors because she thinks they're coming in her house. She thinks people are coming in and moving her furniture around her house. Pt did drive herself. Pt does have bipolar. Has been taking all of her meds regularly. Best oxygen sat is 90% and pt does not wear any oxygen. Pt placed on 2L  because pt staying around 88%. Denies any SI/HI.  ?

## 2021-07-09 NOTE — Telephone Encounter (Signed)
Attempted to contact patient multiple times on both phone numbers provided-had to leave a voicemail advising patient to come into the office and that we could squeeze her in to be evaluated for further medication changes. ? ?Also advised her to go to the nearest emergency department or call 911 if she is unable to do that. ?

## 2021-07-09 NOTE — Assessment & Plan Note (Addendum)
On admission, patient seemed to have acute psychosis with hallucinations and paranoid delusions.  No suicidal homicidal ideations.  Suspect worsening of underlying bipolar disorder in the setting of UTI.  UDS was negative, alcohol level <10, salicylates negative, acetaminophen level negative. --Psychiatry consulted --Patient given IV Ativan per psych --Continue home medications: Abilify, BuSpar, hydroxyzine, Cogentin for bipolar disorder.  Seems mildly improved.  She continues to state not feeling safe returning home due to a "neighbor issue".  Noted by staff to have some paranoia and delusional thinking over course of admission.  Per patient and confirmed by her brother this afternoon (5/21), her current mental state is far from her baseline, and he feels she requires psych attention and medication adjustments prior to feeling she is safe to return home.    5/21 - d/c to geri-psych unit yesterday evening was delayed due to staffing shortage and unable to admit the patient.  5/22 - psychiatry reassessed patient and no longer recommend inpatient admission, but close outpatient follow up with patient's psychiatrist.  Patient's psychiatric symptoms markedly improved during admission.  She is stable for discharge home.
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
## 2021-07-09 NOTE — Assessment & Plan Note (Signed)
-  Continue home medications: Abilify, BuSpar, hydroxyzine, Cogentin ?

## 2021-07-09 NOTE — H&P (Signed)
?History and Physical  ? ? ?Adriana Vasquez ZOX:[id number] DOB: [birth year] DOA: 07/09/2021 ? ?Referring MD/NP/PA:  ? ?PCP: Mick Sell, MD  ? ?Patient coming from:  The patient is coming from home.  ? ? ?Chief Complaint: AMS and hallucination, increased urinary frequency ? ?HPI: Adriana Vasquez is a 76 y.o. female with medical history significant of bipolar, former smoker, chronic venous insufficiency, who presents with altered mental status and hallucination, increased urinary frequency. ? ?Patient is mildly confused, but is still oriented x3.  Patient has hallucination, seeing "someone flashed a light on my front door on Sunday night". "I'm hearing sounds from above my TV".  Patient denies chest pain, cough, shortness of breath.  No fever or chills.  No nausea, vomiting, diarrhea or abdominal pain.  Patient reports increased urinary frequency, no dysuria.  No burning on urination.  Denies suicidal or homicidal ideations. ? ?Data Reviewed and ED Course: pt was found to have WBC 8.6, UDS negative, Tylenol level less than 10, salicylate level less than 7, urinalysis (clear appearance, moderate amount of leukocyte, negative bacteria, WBC 11-20), electrolytes and renal function okay, temperature normal, blood pressure 134/83, heart rate 90, RR 18, oxygen saturation 98% on room air.  Chest x-ray negative.  Patient is placed on MedSurg bed for observation. NP, Gabriel Cirri of psychiatry is consulted. ? ? ?EKG: Not done in ED, will get one.    ? ? ?Review of Systems:  ? ?General: no fevers, chills, no body weight gain, has fatigue ?HEENT: no blurry vision, hearing changes or sore throat ?Respiratory: no dyspnea, coughing, wheezing ?CV: no chest pain, no palpitations ?GI: no nausea, vomiting, abdominal pain, diarrhea, constipation ?GU: no dysuria, burning on urination, has increased urinary frequency, no hematuria  ?Ext: no leg edema ?Neuro: no unilateral weakness, numbness, or tingling, no vision change or  hearing loss. Has confusion ?Skin: no rash, no skin tear. ?MSK: No muscle spasm, no deformity, no limitation of range of movement in spin ?Heme: No easy bruising.  ?Travel history: No recent long distant travel. ?Psychiatry: Has hallucination ? ? ?Allergy: No Known Allergies ? ?Past Medical History:  ?Diagnosis Date  ? Bipolar disorder (HCC)   ? Personal history of tobacco use, presenting hazards to health 01/22/2015  ? ? ?Past Surgical History:  ?Procedure Laterality Date  ? ABDOMINAL HYSTERECTOMY    ? HAND SURGERY Right 2001  ? Patient not sure of what exactly was done.  Repared index finger knuckle area.  ? ? ?Social History:  reports that she has quit smoking. Her smoking use included cigarettes. She started smoking about 41 years ago. She has a 35.00 pack-year smoking history. She has never used smokeless tobacco. She reports that she does not drink alcohol and does not use drugs. ? ?Family History:  ?Family History  ?Problem Relation Age of Onset  ? Arthritis Mother   ? Heart attack Father   ? Diabetes Father   ? Bipolar disorder Father   ? Breast cancer Neg Hx   ?  ? ?Prior to Admission medications   ?Medication Sig Start Date End Date Taking? Authorizing Provider  ?ARIPiprazole (ABILIFY) 2 MG tablet Take 1 tablet (2 mg total) by mouth daily. Dose increase 01/14/21   Jomarie Longs, MD  ?benztropine (COGENTIN) 0.5 MG tablet TAKE 1 TABLET BY MOUTH AT BEDTIME AS NEEDED FOR TREMORS, RESTLESSNESS, OR ABNORMAL MOVEMENTS 05/05/21   Jomarie Longs, MD  ?busPIRone (BUSPAR) 10 MG tablet Take 1 tablet (10 mg total) by mouth 3 (  three) times daily. 11/12/20   Jomarie Longs, MD  ?Calcium Carbonate-Vitamin D 600-400 MG-UNIT per tablet Take by mouth.    [provider]  ?gabapentin (NEURONTIN) 300 MG capsule Take 1 capsule by mouth 3 (three) times daily. 05/14/21 05/14/22  [provider]  ?hydrOXYzine (ATARAX) 10 MG tablet Take 1-2 tablets (10-20 mg total) by mouth daily as needed. For severe anxiety,  agitation 05/05/21   Jomarie Longs, MD  ? ? ?Physical Exam: ?Vitals:  ? 07/09/21 1340 07/09/21 1500 07/09/21 1751 07/09/21 1843  ?BP: 125/84 134/83 104/66 104/66  ?Pulse: 90 74 (!) 53 (!) 53  ?Resp: 18 18 18 18   ?Temp: 97.7 ?F (36.5 ?C)  97.9 ?F (36.6 ?C) 97.9 ?F (36.6 ?C)  ?TempSrc: Oral  Oral   ?SpO2: 90% 98% 97%   ?Weight:      ? ?General: Not in acute distress ?HEENT: ?      Eyes: PERRL, EOMI, no scleral icterus. ?      ENT: No discharge from the ears and nose, no pharynx injection, no tonsillar enlargement.  ?      Neck: No JVD, no bruit, no mass felt. ?Heme: No neck lymph node enlargement. ?Cardiac: S1/S2, RRR, No murmurs, No gallops or rubs. ?Respiratory: No rales, wheezing, rhonchi or rubs. ?GI: Soft, nondistended, nontender, no rebound pain, no organomegaly, BS present. ?GU: No hematuria ?Ext: No pitting leg edema bilaterally. 1+DP/PT pulse bilaterally. ?Musculoskeletal: No joint deformities, No joint redness or warmth, no limitation of ROM in spin. ?Skin: No rashes.  ?Neuro: Has confusion, but still oriented X3, cranial nerves II-XII grossly intact, moves all extremities normally. ?Psych: Has hallucination.  No suicidal or hemocidal ideation. ? ?Labs on Admission: I have personally reviewed following labs and imaging studies ? ?CBC: ?Recent Labs  ?Lab 07/09/21 ?1344  ?WBC 8.6  ?HGB 14.0  ?HCT 42.9  ?MCV 93.9  ?PLT 248  ? ?Basic Metabolic Panel: ?Recent Labs  ?Lab 07/09/21 ?1344  ?NA 139  ?K 3.6  ?CL 103  ?CO2 27  ?GLUCOSE 94  ?BUN 9  ?CREATININE 0.67  ?CALCIUM 9.8  ? ?GFR: ?Estimated Creatinine Clearance: 52.1 mL/min (by C-G formula based on SCr of 0.67 mg/dL). ?Liver Function Tests: ?Recent Labs  ?Lab 07/09/21 ?1344  ?AST 27  ?ALT 21  ?ALKPHOS 75  ?BILITOT 0.9  ?PROT 7.8  ?ALBUMIN 4.2  ? ?No results for input(s): LIPASE, AMYLASE in the last 168 hours. ?No results for input(s): AMMONIA in the last 168 hours. ?Coagulation Profile: ?No results for input(s): INR, PROTIME in the last 168 hours. ?Cardiac  Enzymes: ?No results for input(s): CKTOTAL, CKMB, CKMBINDEX, TROPONINI in the last 168 hours. ?BNP (last 3 results) ?No results for input(s): PROBNP in the last 8760 hours. ?HbA1C: ?No results for input(s): HGBA1C in the last 72 hours. ?CBG: ?No results for input(s): GLUCAP in the last 168 hours. ?Lipid Profile: ?No results for input(s): CHOL, HDL, LDLCALC, TRIG, CHOLHDL, LDLDIRECT in the last 72 hours. ?Thyroid Function Tests: ?No results for input(s): TSH, T4TOTAL, FREET4, T3FREE, THYROIDAB in the last 72 hours. ?Anemia Panel: ?No results for input(s): VITAMINB12, FOLATE, FERRITIN, TIBC, IRON, RETICCTPCT in the last 72 hours. ?Urine analysis: ?   ?Component Value Date/Time  ? COLORURINE STRAW (A) 07/09/2021 1344  ? APPEARANCEUR CLEAR (A) 07/09/2021 1344  ? LABSPEC 1.008 07/09/2021 1344  ? PHURINE 6.0 07/09/2021 1344  ? GLUCOSEU NEGATIVE 07/09/2021 1344  ? HGBUR MODERATE (A) 07/09/2021 1344  ? BILIRUBINUR NEGATIVE 07/09/2021 1344  ? KETONESUR NEGATIVE 07/09/2021  1344  ? PROTEINUR NEGATIVE 07/09/2021 1344  ? NITRITE NEGATIVE 07/09/2021 1344  ? LEUKOCYTESUR MODERATE (A) 07/09/2021 1344  ? ?Sepsis Labs: ?@LABRCNTIP (procalcitonin:4,lacticidven:4) ?)No results found for this or any previous visit (from the past 240 hour(s)).  ? ?Radiological Exams on Admission: ?DG Chest 2 View ? ?Result Date: 07/09/2021 ?CLINICAL DATA:  Provided history: Shortness of breath. EXAM: CHEST - 2 VIEW COMPARISON:  Chest CT 06/03/2021. FINDINGS: Heart size within normal limits. Mild bibasilar atelectasis. No appreciable airspace consolidation. No evidence of pleural effusion or pneumothorax. No acute bony abnormality identified. Degenerative changes of the spine. IMPRESSION: Mild bibasilar atelectasis. No appreciable airspace consolidation or pulmonary edema. Electronically Signed   By: Jackey LogeKyle  Golden D.O.   On: 07/09/2021 14:26   ? ? ? ?Assessment/Plan ?Principal Problem: ?  Acute psychosis (HCC) ?Active Problems: ?  Bipolar 1 disorder (HCC) ?   UTI (urinary tract infection) ? ? ?Principal Problem: ?  Acute psychosis (HCC) ?Active Problems: ?  Bipolar 1 disorder (HCC) ?  UTI (urinary tract infection) ? ? ?Assessment and Plan: ?* Acute psychosis (HCC) ?P

## 2021-07-09 NOTE — Assessment & Plan Note (Addendum)
Presented with urinary frequency and AMS/psychosis with hallucinations.   UA with moderate leukocytes, 11-20 WBCs. -- Continue empiric Rocephin -- Follow up urine culture

## 2021-07-09 NOTE — Plan of Care (Signed)
  Problem: Health Behavior/Discharge Planning: Goal: Ability to manage health-related needs will improve Outcome: Progressing   Problem: Clinical Measurements: Goal: Ability to maintain clinical measurements within normal limits will improve Outcome: Progressing   

## 2021-07-09 NOTE — Progress Notes (Signed)
BH MD OP Progress Note ? ?07/09/2021 1:34 PM ?Adriana Vasquez  ?MRN:  [id number] ? ?Chief Complaint:  ?Chief Complaint  ?Patient presents with  ? Follow-up  ?   ? Paranoid : 76 year old Caucasian female with history of bipolar disorder, currently delusional, paranoid worried about her safety, presented for medication management.  ? ?HPI: Adriana Vasquez is a 76 year old Caucasian female, [place] in [place], retired, widowed, has a history of bipolar disorder, insomnia was evaluated in office today. ? ?Patient had contacted the office reporting worsening mood symptoms, delusional thoughts and was advised to come into the office for an evaluation. ? ?Patient today appeared to be anxious, fixed on her delusional thoughts that her neighbor who used to work as a Electrical engineersecurity guard and who is armed is out to get her.  Patient reports she has been having these thoughts since the past 3 days and getting worse.  It has gotten so worse that she feels she should not be back home tonight.  She will not be able to stay in her apartment anymore if she goes back.  Patient reports she had kept her couch very close to her door, and then she went back to check she saw that her couch was moved a little bit to the side.  That makes her more worried and she believes the neighbor is trying to do something to her.  Patient also reports she has been seeing flashes of light at her window at night.  Patient also feels a 'power surge' that comes through her vent and makes her to go into a panic mode, makes her heart race.  Patient reports she tried calling [employer] since the Power surge was making her TV flicker.  However as soon as she called [employer], the TV that was switched off came back on.  That made her more anxious and worried.   ? ?Patient reports the past few days she has been getting more and more depressed and the current situation is making her more sad.  She reports she is also worried that no one believes her including her brother  who thinks she is " crazy." ? ?Patient although denies suicidality currently with fixed paranoid delusions, worried that she is unsafe in her apartment since her neighbor is out to get her.  Patient reports she already contacted the police twice and they came to her home.  She was advised that they cannot do anything to help her since they have no evidence. ? ?Patient reports she is currently compliant on medications. ? ?Patient reports due to her anxiety she has not been eating enough, she just had a muffin this morning.  She may not be drinking enough water either. ? ? ? ?Visit Diagnosis:  ?  ICD-10-CM   ?1. Bipolar 1 disorder, mixed, severe (HCC)  F31.63   ? with psychosis  ?  ?2. Insomnia due to mental disorder  F51.05   ? psychosis, anxiety  ?  ? ? ?Past Psychiatric History: Reviewed past psychiatric history from progress note on 03/22/2019.  Past trials of Geodon, Abilify, trazodone, hydroxyzine, BuSpar. ? ?Past Medical History:  ?Past Medical History:  ?Diagnosis Date  ? Bipolar disorder (HCC)   ? Personal history of tobacco use, presenting hazards to health 01/22/2015  ?  ?Past Surgical History:  ?Procedure Laterality Date  ? ABDOMINAL HYSTERECTOMY    ? HAND SURGERY Right 2001  ? Patient not sure of what exactly was done.  Repared index finger knuckle area.  ? ? ?  Family Psychiatric History: Reviewed family psychiatric history from progress note on 03/22/2019. ? ?Family History:  ?Family History  ?Problem Relation Age of Onset  ? Arthritis Mother   ? Heart attack Father   ? Diabetes Father   ? Bipolar disorder Father   ? Breast cancer Neg Hx   ? ? ?Social History: Reviewed social history from progress note on 03/22/2019. ?Social History  ? ?Socioeconomic History  ? Marital status: Widowed  ?  Spouse name: Not on file  ? Number of children: Not on file  ? Years of education: Not on file  ? Highest education level: Not on file  ?Occupational History  ? Not on file  ?Tobacco Use  ? Smoking status: Former  ?   Packs/day: 1.00  ?  Years: 35.00  ?  Pack years: 35.00  ?  Types: Cigarettes  ?  Start date: 09/27/1979  ? Smokeless tobacco: Never  ? Tobacco comments:  ?  03/26/2018  ?Vaping Use  ? Vaping Use: Never used  ?Substance and Sexual Activity  ? Alcohol use: No  ?  Alcohol/week: 0.0 standard drinks  ? Drug use: No  ? Sexual activity: Not Currently  ?Other Topics Concern  ? Not on file  ?Social History Narrative  ? Not on file  ? ?Social Determinants of Health  ? ?Financial Resource Strain: Not on file  ?Food Insecurity: Not on file  ?Transportation Needs: Not on file  ?Physical Activity: Not on file  ?Stress: Not on file  ?Social Connections: Not on file  ? ? ?Allergies: No Known Allergies ? ?Metabolic Disorder Labs: ?Lab Results  ?Component Value Date  ? HGBA1C 5.7 (H) 04/10/2020  ? MPG 117 04/10/2020  ? ?Lab Results  ?Component Value Date  ? PROLACTIN 20.3 04/10/2020  ? PROLACTIN 26.7 (H) 02/07/2015  ? ?Lab Results  ?Component Value Date  ? CHOL 218 (H) 12/07/2014  ? TRIG 153 (H) 12/07/2014  ? HDL 60 12/07/2014  ? LDLCALC 127 (H) 12/07/2014  ? ?No results found for: TSH ? ?Therapeutic Level Labs: ?No results found for: LITHIUM ?No results found for: VALPROATE ?No components found for:  CBMZ ? ?Current Medications: ?Current Outpatient Medications  ?Medication Sig Dispense Refill  ? ARIPiprazole (ABILIFY) 2 MG tablet Take 1 tablet (2 mg total) by mouth daily. Dose increase 90 tablet 1  ? benztropine (COGENTIN) 0.5 MG tablet TAKE 1 TABLET BY MOUTH AT BEDTIME AS NEEDED FOR TREMORS, RESTLESSNESS, OR ABNORMAL MOVEMENTS 90 tablet 0  ? busPIRone (BUSPAR) 10 MG tablet Take 1 tablet (10 mg total) by mouth 3 (three) times daily. 270 tablet 2  ? Calcium Carbonate-Vitamin D 600-400 MG-UNIT per tablet Take by mouth.    ? gabapentin (NEURONTIN) 300 MG capsule Take 1 capsule by mouth 3 (three) times daily.    ? hydrOXYzine (ATARAX) 10 MG tablet Take 1-2 tablets (10-20 mg total) by mouth daily as needed. For severe anxiety, agitation 15  tablet 1  ? ?No current facility-administered medications for this visit.  ? ? ? ?Musculoskeletal: ?Strength & Muscle Tone: within normal limits ?Gait & Station: normal ?Patient leans: N/A ? ?Psychiatric Specialty Exam: ?Review of Systems  ?Psychiatric/Behavioral:  Positive for dysphoric mood, hallucinations and sleep disturbance. The patient is nervous/anxious.   ?All other systems reviewed and are negative.  ?Blood pressure 116/74, pulse (!) 125, temperature 97.6 ?F (36.4 ?C), temperature source Temporal, weight 141 lb (64 kg).Body mass index is 27.54 kg/m?.  ?General Appearance: Casual  ?Eye Contact:  Fair  ?  Speech:  Pressured  ?Volume:  Normal  ?Mood:  Anxious and Depressed  ?Affect:  Appropriate  ?Thought Process:  Irrelevant and Descriptions of Associations: Circumstantial  ?Orientation:  Full (Time, Place, and Person)  ?Thought Content: Delusions, Hallucinations: Visual, and Paranoid Ideation   ?Suicidal Thoughts:  No  ?Homicidal Thoughts:  No  ?Memory:  Immediate;   Fair ?Recent;   Fair ?Remote;   Poor  ?Judgement:  Impaired  ?Insight:  Shallow  ?Psychomotor Activity:  Normal  ?Concentration:  Concentration: Fair and Attention Span: Fair  ?Recall:  Fair  ?Fund of Knowledge: Fair  ?Language: Fair  ?Akathisia:  No  ?Handed:  Right  ?AIMS (if indicated): done  ?Assets:  Communication Skills ?Housing ?Social Support  ?ADL's:  Intact  ?Cognition: WNL  ?Sleep:  Poor  ? ?Screenings: ?AIMS   ? ?Flowsheet Row Office Visit from 07/09/2021 in [facility] Office Visit from 05/05/2021 in [facility] Office Visit from 01/14/2021 in [facility] Office Visit from 10/04/2017 in [facility] Office Visit from 12/14/2016 in [facility]  ?AIMS Total Score 0 0 0 0 0  ? ?  ? ?GAD-7   ? ?Flowsheet Row Office Visit from 08/13/2020 in [facility]  ?Total GAD-7 Score  2  ? ?  ? ?PHQ2-9   ? ?Flowsheet Row Office Visit from 07/09/2021 in [facility] Office Visit from 05/05/2021 in [facility] Video Visit from 1/12/202

## 2021-07-09 NOTE — ED Notes (Signed)
Informed RN bed assigned 

## 2021-07-09 NOTE — Telephone Encounter (Signed)
Patient called requesting to talk with provider. Says she is in a crisis. Increased anxiety. Says [place] neighborhood and is so scared she can't sleep. Checked provider's schedule - 10 minutes available in the afternoon. Agreed to a call back after discussing with provider. Patient advised of options to go to ER or [place] if needing immediate assistance. Patient's brother then called. States he is POA/HCPOA but no documents on her record. No ROI either so I could only listen to his feedback. He stated she is paranoid and needs to be seen/evaluated. "Heading down the same path as before." He is 200 miles away and cannot assist immediately. He was also informed of [facility] and Emergency Care options.  ? ?Multiple phone calls with patient and brother who emailed POA document which is not a HCPOA but does contain [place] provisions. Patient resisted coming into office ?stating "I don't feel comfortable leaving my house " but eventually agreed to come in to the clinic to see provider. ? ?Patient's brother emailed screen shots pf text messages he exchanged with patient today. Forwarded these to provider after receiving them however this was after the appointment had concluded. ?

## 2021-07-09 NOTE — ED Notes (Signed)
Pt at the front desk states "Im leaving" pt encouraged to stay but is refusing, charge and MD notified, pt is attempting to walk out the door, pt is confused and is refusing to wear Oxygen that was applied during triage. Concerned for pt safety.  ?

## 2021-07-09 NOTE — ED Notes (Signed)
Pt escorted to room 12.

## 2021-07-10 DIAGNOSIS — Z818 Family history of other mental and behavioral disorders: Secondary | ICD-10-CM | POA: Diagnosis not present

## 2021-07-10 DIAGNOSIS — Z833 Family history of diabetes mellitus: Secondary | ICD-10-CM | POA: Diagnosis not present

## 2021-07-10 DIAGNOSIS — Z8249 Family history of ischemic heart disease and other diseases of the circulatory system: Secondary | ICD-10-CM | POA: Diagnosis not present

## 2021-07-10 DIAGNOSIS — Z8261 Family history of arthritis: Secondary | ICD-10-CM | POA: Diagnosis not present

## 2021-07-10 DIAGNOSIS — F419 Anxiety disorder, unspecified: Secondary | ICD-10-CM | POA: Diagnosis present

## 2021-07-10 DIAGNOSIS — Z87891 Personal history of nicotine dependence: Secondary | ICD-10-CM | POA: Diagnosis not present

## 2021-07-10 DIAGNOSIS — F23 Brief psychotic disorder: Secondary | ICD-10-CM | POA: Diagnosis present

## 2021-07-10 DIAGNOSIS — N390 Urinary tract infection, site not specified: Secondary | ICD-10-CM | POA: Diagnosis present

## 2021-07-10 DIAGNOSIS — I959 Hypotension, unspecified: Secondary | ICD-10-CM | POA: Diagnosis present

## 2021-07-10 DIAGNOSIS — E876 Hypokalemia: Secondary | ICD-10-CM | POA: Diagnosis present

## 2021-07-10 DIAGNOSIS — Z79899 Other long term (current) drug therapy: Secondary | ICD-10-CM | POA: Diagnosis not present

## 2021-07-10 DIAGNOSIS — N3000 Acute cystitis without hematuria: Secondary | ICD-10-CM | POA: Diagnosis not present

## 2021-07-10 DIAGNOSIS — F319 Bipolar disorder, unspecified: Secondary | ICD-10-CM | POA: Diagnosis present

## 2021-07-10 LAB — URINE CULTURE: Culture: <10000 — AB

## 2021-07-10 LAB — LACTIC ACID, PLASMA: Lactic Acid, Venous: 1.4 mmol/L (ref 0.5–1.9)

## 2021-07-10 MED ORDER — MIDODRINE HCL 5 MG PO TABS
10.0000 mg | ORAL_TABLET | Freq: Once | ORAL | Status: AC
Start: 2021-07-10 — End: 2021-07-10
  Administered 2021-07-10: 10 mg via ORAL
  Filled 2021-07-10: qty 2

## 2021-07-10 MED ORDER — SODIUM CHLORIDE 0.9 % IV BOLUS
1000.0000 mL | Freq: Once | INTRAVENOUS | Status: AC
  Administered 2021-07-10: 1000 mL via INTRAVENOUS

## 2021-07-10 MED ORDER — OLANZAPINE 5 MG PO TABS
5.0000 mg | ORAL_TABLET | Freq: Every day | ORAL | Status: DC
Start: 2021-07-10 — End: 2021-07-10

## 2021-07-10 MED ORDER — LACTATED RINGERS IV BOLUS
1000.0000 mL | Freq: Once | INTRAVENOUS | Status: AC
  Administered 2021-07-10: 1000 mL via INTRAVENOUS

## 2021-07-10 NOTE — Progress Notes (Signed)
Pt c/o feeling dizzy. Initial BP taken automatically 63/35, manual was 70/40, HR 45-49. Afebrile. Pt placed in Trendelenburg position and BP improved to 89/49, HR 49. Bishop Limbo, NP to bedside. 1L NS bolus ordered.  No significant change after 1L bolus, another bolus, 1L LR, ordered and midodrine per K. Foust,NP. Pt administered midodrine, bolus completed. BP increased to 94/67 at shift change. Pt sleeping. K.Foust,NP @ bedside for BP result.

## 2021-07-10 NOTE — Progress Notes (Addendum)
       CROSS COVER NOTE  NAME: NAKYIAH PERINA MRN: [id number] DOB : 1946-02-09  2L IVF bolus and PO midodrine ordered for BP 84/52 MAP 63.   Neomia Glass DNP, MHA, FNP-BC Nurse Practitioner [facility] Pager [phone]

## 2021-07-10 NOTE — Progress Notes (Signed)
Progress Note   Patient: Adriana Vasquez [id number] DOB: June 07, 1945 DOA: 07/09/2021     0 DOS: the patient was seen and examined on 07/10/2021   Brief hospital course: Adriana Vasquez is a 76 y.o. female with medical history significant of bipolar, former smoker, chronic venous insufficiency, who presented on 07/09/2021 with altered mental status, hallucinations and paranoid thoughts, and increased urinary frequency.  Found to have a UTI on evaluation in the ED. Started on empiric Rocephin pending cultures.  Psychiatry was consulted for the acute on chronic psychiatric symptoms and paranoid delusions which are felt due to the UTI.     Assessment and Plan: * Acute psychosis ([place]) On admission, patient seemed to have acute psychosis with hallucinations and paranoid delusions.  No suicidal homicidal ideations.  Suspect worsening of underlying bipolar disorder in the setting of UTI.  UDS was negative, alcohol level [id number], salicylates negative, acetaminophen level negative. --Psychiatry consulted --Patient given IV Ativan per psych --Continue home medications: Abilify, BuSpar, hydroxyzine, Cogentin for bipolar  5/18: Seems mildly improved.  She continues to state not feeling safe returning home due to a "neighbor issue".  UTI (urinary tract infection) Presented with urinary frequency and AMS/psychosis with hallucinations.   UA with moderate leukocytes, 11-20 WBCs. -- Continue empiric Rocephin -- Follow up urine culture  Bipolar 1 disorder ([place]) -Continue home medications: Abilify, BuSpar, hydroxyzine, Cogentin        Subjective: Patient was awake sitting up in bed when seen on rounds today.  Early hours this morning, her BP dropped low and she reports feeling like she was about to pass out.  She feels better today.  She continues to express concerns for her safety at home citing a "neighbor issue". She pointed to the eraser on the dry erase board in the room, and asked what kind of  device that was.  She seemed reassured when I told her what it was and showed her by erasing some writing on the board.  She asked that I call her brother today.  In speaking with him, patient has been having worsened delusions, hallucinations and anxiety for the past several weeks that got much worse a few days ago.  He feels she needs medications adjusted prior to returning home.  Physical Exam: Vitals:   07/10/21 0654 07/10/21 0906 07/10/21 1140 07/10/21 1439  BP: 94/67 (!) 104/59 (!) 103/53 (!) 99/50  Pulse: 90 63 74 (!) 51  Resp:  18 18 19   Temp:  97.7 F (36.5 C)    TempSrc:      SpO2:  100% 100% 96%  Weight:      Height:       General exam: awake, alert, no acute distress HEENT: moist mucus membranes, hard of hearing without hearing aids in place Respiratory system: CTAB, no wheezes, rales or rhonchi, normal respiratory effort. Cardiovascular system: normal S1/S2, RRR, no pedal edema.   Gastrointestinal system: soft, NT, ND, no HSM felt, +bowel sounds. Central nervous system: A&O x3. no gross focal neurologic deficits, normal speech Extremities: moves all, no edema, normal tone Skin: dry, intact, normal temperature, no rashes seen Psychiatry: Anxious mood, congruent affect, abnormal judgement and insight with paranoid delusions   Data Reviewed:  Notable labs -lactic acid normal 1.4 .  Admission labs from yesterday are unremarkable.  Follow urine culture  Family Communication: Spoke with brother Richardson Landry this afternoon by phone 5/18  Disposition: Status is: Inpatient Remains inpatient appropriate because: Hypotensive earlier this morning requiring 2 L bolus fluids.  On empiric IV antibiotics pending urine culture.  Psychiatry following, patient may require behavioral admission once medically ready.   Planned Discharge Destination:  Home versus behavioral health unit    Time spent: 45 minutes including time spent at bedside, and in coordination of care and review of  current and past medical records  Author: Ezekiel Slocumb, DO 07/10/2021 3:28 PM  For on call review [email or web address].

## 2021-07-10 NOTE — Plan of Care (Signed)
  Problem: Pain Managment: Goal: General experience of comfort will improve Outcome: Progressing   Problem: Safety: Goal: Ability to remain free from injury will improve Outcome: Progressing   

## 2021-07-10 NOTE — Consult Note (Signed)
Writer saw patient face-to-face this evening. She is sitting up in bed, brother at bedside. She is not tearful today. When asked if she remembers Clinical research associate she responds in the affirmative, saying she saw me in the ED. She remains pleasant, cooperative. She says she is feeling better. She does continue with her delusions of "neighbors bothering her" and sometimes suspicious of things in the hospital (like asking why the computer goes off and on--brother explains it is the screen saver). She appears to know  these are delusions but struggles to truly believe they are not real and people aren't doing things to "trick her."   She had experienced some hypotension last night. Was given bolus IV fluids.   Discussed going to gero psych unit tomorrow if necessary, as we do not want her to go home and experience these delusions that are so frightening. She is hesitant to go inpatient as she states she had a "bad" experience there about 10 years ago. Writer and brother offered support and encouragement and patient agreed she would go if she needs to.   Discontinued Zyprexa at this time out of caution, as B/P remains low. Encouraged fluids  Vanetta Mulders, PMHNP-BC

## 2021-07-10 NOTE — Hospital Course (Signed)
Adriana Vasquez is a 76 y.o. female with medical history significant of bipolar, former smoker, chronic venous insufficiency, who presented on 07/09/2021 with altered mental status, hallucinations and paranoid thoughts, and increased urinary frequency.  Found to have a UTI on evaluation in the ED. Started on empiric Rocephin pending cultures.  Psychiatry was consulted for the acute on chronic psychiatric symptoms and paranoid delusions which are felt due to the UTI.

## 2021-07-11 ENCOUNTER — Inpatient Hospital Stay: Admission: AD | Admit: 2021-07-11 | Payer: Medicare PPO | Source: Intra-hospital | Admitting: Psychiatry

## 2021-07-11 DIAGNOSIS — F23 Brief psychotic disorder: Secondary | ICD-10-CM | POA: Diagnosis not present

## 2021-07-11 DIAGNOSIS — E876 Hypokalemia: Secondary | ICD-10-CM | POA: Diagnosis not present

## 2021-07-11 LAB — BASIC METABOLIC PANEL
Anion gap: 9 (ref 5–15)
BUN: 13 mg/dL (ref 8–23)
CO2: 27 mmol/L (ref 22–32)
Calcium: 8.4 mg/dL — ABNORMAL LOW (ref 8.9–10.3)
Chloride: 106 mmol/L (ref 98–111)
Creatinine, Ser: 0.69 mg/dL (ref 0.44–1.00)
GFR, Estimated: >60 mL/min (ref >60)
Glucose, Bld: 89 mg/dL (ref 70–99)
Potassium: 3.4 mmol/L — ABNORMAL LOW (ref 3.5–5.1)
Sodium: 142 mmol/L (ref 135–145)

## 2021-07-11 MED ORDER — POTASSIUM CHLORIDE CRYS ER 20 MEQ PO TBCR
40.0000 meq | EXTENDED_RELEASE_TABLET | Freq: Once | ORAL | Status: AC
Start: 2021-07-11 — End: 2021-07-11
  Administered 2021-07-11: 40 meq via ORAL
  Filled 2021-07-11: qty 2

## 2021-07-11 NOTE — Assessment & Plan Note (Signed)
Potassium 3.4 this morning, replaced. Repeat BMP in 1 week

## 2021-07-11 NOTE — Plan of Care (Signed)

## 2021-07-11 NOTE — Progress Notes (Addendum)
  Progress Note   Patient: Adriana Vasquez ATF:[id number] DOB: [birth year] DOA: 07/09/2021     1 DOS: the patient was seen and examined on 07/11/2021   Brief hospital course: Adriana Vasquez is a 76 y.o. female with medical history significant of bipolar, former smoker, chronic venous insufficiency, who presented on 07/09/2021 with altered mental status, hallucinations and paranoid thoughts, and increased urinary frequency.  Found to have a UTI on evaluation in the ED. Started on empiric Rocephin pending cultures.  Psychiatry was consulted for the acute on chronic psychiatric symptoms and paranoid delusions which are felt due to the UTI.     Assessment and Plan: * Acute psychosis (HCC) On admission, patient seemed to have acute psychosis with hallucinations and paranoid delusions.  No suicidal homicidal ideations.  Suspect worsening of underlying bipolar disorder in the setting of UTI.  UDS was negative, alcohol level <10, salicylates negative, acetaminophen level negative. --Psychiatry consulted --Patient given IV Ativan per psych --Continue home medications: Abilify, BuSpar, hydroxyzine, Cogentin for bipolar  5/18: Seems mildly improved.  She continues to state not feeling safe returning home due to a "neighbor issue".  Awaiting final decision regarding admission to geropsych unit from here.  UTI (urinary tract infection) Presented with urinary frequency and AMS/psychosis with hallucinations.   UA with moderate leukocytes, 11-20 WBCs. -- Continue empiric Rocephin -- Follow up urine culture  Bipolar 1 disorder (HCC) -Continue home medications: Abilify, BuSpar, hydroxyzine, Cogentin  Hypokalemia Potassium 3.4 this morning, replaced. Repeat BMP in 1 week        Subjective: Patient seated up in recliner when seen on rounds morning.  She reports feeling well, better.  No fevers chills, dysuria or urinary frequency.  She says she has not yet decided if she wants to be admitted to  behavioral, wants to talk with her brother more about it today.  Physical Exam: Vitals:   07/10/21 1946 07/10/21 2324 07/11/21 0309 07/11/21 0720  BP: (!) 107/58 115/63 107/60 119/63  Pulse: 65 66 67 63  Resp: 15 15 15 20   Temp: 98.2 F (36.8 C) 98 F (36.7 C) 98.2 F (36.8 C) 97.8 F (36.6 C)  TempSrc:      SpO2: 95% 98% 94% 97%  Weight:      Height:       General exam: awake, alert, no acute distress, seated in recliner HEENT: moist mucus membranes, hearing grossly normal, now has hearing aids in place Respiratory system: Normal respiratory effort, on room air  Cardiovascular system: Regular rate and rhythm, no peripheral edema.   Central nervous system: A&O x3. no gross focal neurologic deficits, normal speech Extremities: moves all, no edema, normal tone Skin: dry, intact, normal temperature, no rashes seen Psychiatry: Normal mood, congruent affect, no apparent visual or auditory hallucinations during encounter   Data Reviewed: Notable labs: Potassium 3.4, calcium 8.4 Urine culture with insignificant growth    Family Communication: Spoke with brother afternoon by phone 5/18  Disposition: Status is: Inpatient Remains inpatient appropriate because: Awaiting final recommendation from psychiatry, anticipating discharge to geropsych unit for stabilization.   Planned Discharge Destination:  Home versus behavioral health unit    Time spent: 35 minutes including time spent at bedside, and in coordination of care   Author: 6/18, DO 07/11/2021 2:59 PM  For on call review [email or web address].

## 2021-07-12 ENCOUNTER — Encounter: Payer: Self-pay | Admitting: Internal Medicine

## 2021-07-12 DIAGNOSIS — F23 Brief psychotic disorder: Secondary | ICD-10-CM | POA: Diagnosis not present

## 2021-07-12 MED ORDER — WITCH HAZEL-GLYCERIN EX PADS
MEDICATED_PAD | CUTANEOUS | Status: DC | PRN
  Filled 2021-07-12: qty 100

## 2021-07-12 NOTE — Plan of Care (Signed)

## 2021-07-12 NOTE — Discharge Summary (Signed)
Physician Discharge Summary   Patient: Adriana Vasquez MRN: [id number] DOB: 04-Aug-1945  Admit date:     07/09/2021  Discharge date: 07/12/21  Discharge Physician: Ezekiel Slocumb   PCP: Leonel Ramsay, MD   Recommendations at discharge:    Follow up with psychiatry / behavioral health admission Follow up with primary care in 1-2 weeks Repeat CBC, BMP in 1-2 weeks  Other follow ups per psychiatry upon d/c from [place]  Discharge Diagnoses: Principal Problem:   Acute psychosis ([place]) Active Problems:   Bipolar 1 disorder ([place])   Hypokalemia  Resolved Problems:   UTI (urinary tract infection)  Hospital Course: Adriana Vasquez is a 76 y.o. female with medical history significant of bipolar, former smoker, chronic venous insufficiency, who presented on 07/09/2021 with altered mental status, hallucinations and paranoid thoughts, and increased urinary frequency.  Found to have a UTI on evaluation in the ED. Started on empiric Rocephin pending cultures.  Psychiatry was consulted for the acute on chronic psychiatric symptoms and paranoid delusions which are felt due to the UTI.      Patient completed 3 days Rocephin for UTI, although culture return with no significant growth.  Pt afebrile, without urinary symptoms.  Medically stable for discharge to geripsych unit this evening.  Per [facility] staff, will be admitted to them after 7pm this evening due to staffing.    Assessment and Plan: * Acute psychosis ([place]) On admission, patient seemed to have acute psychosis with hallucinations and paranoid delusions.  No suicidal homicidal ideations.  Suspect worsening of underlying bipolar disorder in the setting of UTI.  UDS was negative, alcohol level Q000111Q, salicylates negative, acetaminophen level negative. --Psychiatry consulted --Patient given IV Ativan per psych --Continue home medications: Abilify, BuSpar, hydroxyzine, Cogentin for bipolar  Seems mildly improved.  She continues to state not  feeling safe returning home due to a "neighbor issue".  Noted by staff to have some paranoia and delusional thinking over course of admission.  5/20: to be admitted to [facility] this evening for further management of uncontrolled psychiatric symptoms.  UTI (urinary tract infection)-resolved as of 07/12/2021 Presented with urinary frequency and AMS/psychosis with hallucinations.   UA with moderate leukocytes, 11-20 WBCs. -- Continue empiric Rocephin -- Follow up urine culture  Bipolar 1 disorder ([place]) -Continue home medications: Abilify, BuSpar, hydroxyzine, Cogentin  Hypokalemia Potassium 3.4 this morning, replaced. Repeat BMP in 1 week         Consultants: Psychiatry Procedures performed: none  Disposition:  Geropsych / BHU Diet recommendation:  Discharge Diet Orders (From admission, onward)     Start     Ordered   07/12/21 0000  Diet - low sodium heart healthy        07/12/21 1331           Cardiac diet DISCHARGE MEDICATION: Allergies as of 07/12/2021   No Known Allergies      Medication List     TAKE these medications    ARIPiprazole 2 MG tablet Commonly known as: ABILIFY Take 1 tablet (2 mg total) by mouth daily. Dose increase   benztropine 0.5 MG tablet Commonly known as: COGENTIN TAKE 1 TABLET BY MOUTH AT BEDTIME AS NEEDED FOR TREMORS, RESTLESSNESS, OR ABNORMAL MOVEMENTS   busPIRone 10 MG tablet Commonly known as: BUSPAR Take 1 tablet (10 mg total) by mouth 3 (three) times daily.   Calcium Carbonate-Vitamin D 600-400 MG-UNIT tablet Take by mouth.   gabapentin 300 MG capsule Commonly known as: NEURONTIN Take 1 capsule  by mouth 3 (three) times daily.   hydrOXYzine 10 MG tablet Commonly known as: ATARAX Take 1-2 tablets (10-20 mg total) by mouth daily as needed. For severe anxiety, agitation        Discharge Exam: Filed Weights   07/09/21 1339  Weight: 63.5 kg   General exam: awake, alert, no acute distress HEENT: moist mucus membranes,  hearing grossly normal  Respiratory system: normal respiratory effort.  On room air Cardiovascular system: RRR, no pedal edema.   Gastrointestinal system: soft, NT, ND, no HSM felt, +bowel sounds. Central nervous system: A&O x3. no gross focal neurologic deficits, normal speech Extremities: moves all, no edema, normal tone Skin: dry, intact, normal temperature Psychiatry: normal mood, congruent affect, judgement and insight appear normal   Condition at discharge: stable  The results of significant diagnostics from this hospitalization (including imaging, microbiology, ancillary and laboratory) are listed below for reference.   Imaging Studies: DG Chest 2 View  Result Date: 07/09/2021 CLINICAL DATA:  Provided history: Shortness of breath. EXAM: CHEST - 2 VIEW COMPARISON:  Chest CT 06/03/2021. FINDINGS: Heart size within normal limits. Mild bibasilar atelectasis. No appreciable airspace consolidation. No evidence of pleural effusion or pneumothorax. No acute bony abnormality identified. Degenerative changes of the spine. IMPRESSION: Mild bibasilar atelectasis. No appreciable airspace consolidation or pulmonary edema. Electronically Signed   By: Kellie Simmering D.O.   On: 07/09/2021 14:26    Microbiology: Results for orders placed or performed during the hospital encounter of 07/09/21  Urine Culture     Status: Abnormal   Collection Time: 07/09/21  1:44 PM   Specimen: Urine, Clean Catch  Result Value Ref Range Status   Specimen Description   Final    URINE, CLEAN CATCH Performed at [facility], [address] [place], [place] [address]    Special Requests   Final    NONE Performed at [facility], [address], [place], [place] [address]    Culture (A)  Final    <10,000 COLONIES/mL INSIGNIFICANT GROWTH Performed at [facility], [place] [address], [place], [place] [address]    Report Status 07/10/2021 FINAL  Final    Labs: CBC: Recent Labs  Lab  07/09/21 1344  WBC 8.6  HGB 14.0  HCT 42.9  MCV 93.9  PLT Q000111Q   Basic Metabolic Panel: Recent Labs  Lab 07/09/21 1344 07/11/21 0522  NA 139 142  K 3.6 3.4*  CL 103 106  CO2 27 27  GLUCOSE 94 89  BUN 9 13  CREATININE 0.67 0.69  CALCIUM 9.8 8.4*   Liver Function Tests: Recent Labs  Lab 07/09/21 1344  AST 27  ALT 21  ALKPHOS 75  BILITOT 0.9  PROT 7.8  ALBUMIN 4.2   CBG: No results for input(s): GLUCAP in the last 168 hours.  Discharge time spent: less than 30 minutes.  Signed: Ezekiel Slocumb, DO [facility] 07/12/2021

## 2021-07-13 DIAGNOSIS — F23 Brief psychotic disorder: Secondary | ICD-10-CM | POA: Diagnosis not present

## 2021-07-13 DIAGNOSIS — F319 Bipolar disorder, unspecified: Secondary | ICD-10-CM | POA: Diagnosis not present

## 2021-07-13 MED ORDER — ARIPIPRAZOLE 2 MG PO TABS
5.0000 mg | ORAL_TABLET | Freq: Every day | ORAL | Status: DC
Start: 2021-07-14 — End: 2021-07-14
  Administered 2021-07-14: 5 mg via ORAL
  Filled 2021-07-13: qty 3

## 2021-07-13 NOTE — TOC Progression Note (Signed)
Transition of Care [facility]) - Progression Note    Patient Details  Name: Adriana Vasquez MRN: [id number] Date of Birth: October 05, 1945  Transition of Care [facility]) CM/SW Contact  Nessie Nong, [place], [place] Phone Number: 07/13/2021, 4:06 PM  Clinical Narrative:     Patient faxed out to additional psychiatric facilities for possible bed availabilities per providers request.  Verna Czech, LCSW Transition of Care [phone]        Expected Discharge Plan and Services           Expected Discharge Date: 07/12/21                                     Social Determinants of Health (SDOH) Interventions    Readmission Risk Interventions     View : No data to display.

## 2021-07-13 NOTE — Consult Note (Addendum)
[facility] Face-to-Face Psychiatry Consult   Reason for Consult:  paranoia Referring Physician:  Dr Denton Lank Patient Identification: Adriana Vasquez MRN:  [id number] Principal Diagnosis: Acute psychosis (HCC) Diagnosis:  Principal Problem:   Acute psychosis (HCC) Active Problems:   Bipolar 1 disorder (HCC)   Hypokalemia   Total Time spent with patient: 45 minutes  Subjective:   Adriana Vasquez is a 76 y.o. female patient admitted with medical issues, paranoia.  HPI:  76 yo female who presented to the ED with delusions and paranoia.  This provider saw her on Friday and she continued to have these symptoms.  Today on assessment, she does not feel this way anymore.  However, her brother does not feel she is at her baseline, see note below.  No paranoia and "I'm much better physically."  No mania symptoms or suicidal/homicidal ideations.  Reports "kinda anxious" on assessment, no panic attacks.    "She doesn't want to go home.  She's not stable.  She doesn't appear to be as paranoid.  When she was watching tv yesterday, she was seeing people she knew on tv.  She can fool you."  He continued to add that she was having some confusion as who she was and memory concerns, "she needs to be somewhere."  This provider let him know that she would be faxed out to hospitals for admission outside of [facility] based on bed availability and staffing.  Also explained that she may stabilize prior to placement and then would look at discharge.  Past Psychiatric History: bipolar d/o  Risk to Self:  none Risk to Others:  none Prior Inpatient Therapy:  yes Prior Outpatient Therapy:  DR Elna Breslow  Past Medical History:  Past Medical History:  Diagnosis Date   Bipolar disorder (HCC)    Personal history of tobacco use, presenting hazards to health 01/22/2015   UTI (urinary tract infection) 07/09/2021    Past Surgical History:  Procedure Laterality Date   ABDOMINAL HYSTERECTOMY     HAND SURGERY Right 2001   Patient not  sure of what exactly was done.  Repared index finger knuckle area.   Family History:  Family History  Problem Relation Age of Onset   Arthritis Mother    Heart attack Father    Diabetes Father    Bipolar disorder Father    Breast cancer Neg Hx    Family Psychiatric  History: see above Social History:  Social History   Substance and Sexual Activity  Alcohol Use No   Alcohol/week: 0.0 standard drinks     Social History   Substance and Sexual Activity  Drug Use No    Social History   Socioeconomic History   Marital status: Widowed    Spouse name: Not on file   Number of children: Not on file   Years of education: Not on file   Highest education level: Not on file  Occupational History   Not on file  Tobacco Use   Smoking status: Former    Packs/day: 1.00    Years: 35.00    Pack years: 35.00    Types: Cigarettes    Start date: 09/27/1979   Smokeless tobacco: Never   Tobacco comments:    03/26/2018  Vaping Use   Vaping Use: Never used  Substance and Sexual Activity   Alcohol use: No    Alcohol/week: 0.0 standard drinks   Drug use: No   Sexual activity: Not Currently  Other Topics Concern   Not on file  Social  History Narrative   Not on file   Social Determinants of Health   Financial Resource Strain: Not on file  Food Insecurity: Not on file  Transportation Needs: Not on file  Physical Activity: Not on file  Stress: Not on file  Social Connections: Not on file   Additional Social History:    Allergies:  No Known Allergies  Labs: No results found for this or any previous visit (from the past 48 hour(s)).  Current Facility-Administered Medications  Medication Dose Route Frequency Provider Last Rate Last Admin   acetaminophen (TYLENOL) tablet 650 mg  650 mg Oral Q6H PRN Lorretta Harp, MD   650 mg at 07/11/21 0859   [START ON 07/14/2021] ARIPiprazole (ABILIFY) tablet 5 mg  5 mg Oral Daily Charm Rings, NP       benztropine (COGENTIN) tablet 0.5 mg  0.5  mg Oral BID PRN Lorretta Harp, MD       busPIRone (BUSPAR) tablet 10 mg  10 mg Oral TID Lorretta Harp, MD   10 mg at 07/13/21 0857   calcium-vitamin D (OSCAL WITH D) 500-5 MG-MCG per tablet 1 tablet  1 tablet Oral Q breakfast Lorretta Harp, MD   1 tablet at 07/13/21 0857   enoxaparin (LOVENOX) injection 40 mg  40 mg Subcutaneous Q24H Lorretta Harp, MD   40 mg at 07/12/21 2308   gabapentin (NEURONTIN) capsule 300 mg  300 mg Oral TID Lorretta Harp, MD   300 mg at 07/13/21 0857   hydrOXYzine (ATARAX) tablet 10-20 mg  10-20 mg Oral Daily PRN Lorretta Harp, MD   20 mg at 07/12/21 2211   ondansetron (ZOFRAN) injection 4 mg  4 mg Intravenous Q8H PRN Lorretta Harp, MD       witch hazel-glycerin (TUCKS) pad   Topical PRN Pennie Banter, DO        Musculoskeletal: Strength & Muscle Tone: within normal limits Gait & Station: normal Patient leans: N/A  Psychiatric Specialty Exam: Physical Exam Vitals and nursing note reviewed.  Constitutional:      Appearance: Normal appearance.  HENT:     Head: Normocephalic.     Nose: Nose normal.  Pulmonary:     Effort: Pulmonary effort is normal.  Musculoskeletal:        General: Normal range of motion.     Cervical back: Normal range of motion.  Neurological:     General: No focal deficit present.     Mental Status: She is alert and oriented to person, place, and time.  Psychiatric:        Attention and Perception: Attention and perception normal.        Mood and Affect: Mood is anxious.        Speech: Speech normal.        Behavior: Behavior normal. Behavior is cooperative.        Thought Content: Thought content normal.        Cognition and Memory: Cognition and memory normal.        Judgment: Judgment normal.    Review of Systems  Psychiatric/Behavioral:  The patient is nervous/anxious.   All other systems reviewed and are negative.  Blood pressure 116/62, pulse 90, temperature 97.8 F (36.6 C), temperature source Oral, resp. rate 18, height 5\' 9"  (1.753 m),  weight 63.5 kg, SpO2 96 %.Body mass index is 20.67 kg/m.  General Appearance: Casual  Eye Contact:  Good  Speech:  Normal Rate  Volume:  Normal  Mood:  Anxious  Affect:  Congruent  Thought Process:  Coherent and Descriptions of Associations: Intact  Orientation:  Full (Time, Place, and Person)  Thought Content:  WDL and Logical  Suicidal Thoughts:  No  Homicidal Thoughts:  No  Memory:  Immediate;   Good Recent;   Good Remote;   Good  Judgement:  Fair  Insight:  Fair  Psychomotor Activity:  Normal  Concentration:  Concentration: Good and Attention Span: Good  Recall:  Good  Fund of Knowledge:  Good  Language:  Good  Akathisia:  No  Handed:  Right  AIMS (if indicated):     Assets:  Housing Leisure Time Physical Health Resilience Social Support  ADL's:  Intact  Cognition:  WNL  Sleep:        Physical Exam: Physical Exam Vitals and nursing note reviewed.  Constitutional:      Appearance: Normal appearance.  HENT:     Head: Normocephalic.     Nose: Nose normal.  Pulmonary:     Effort: Pulmonary effort is normal.  Musculoskeletal:        General: Normal range of motion.     Cervical back: Normal range of motion.  Neurological:     General: No focal deficit present.     Mental Status: She is alert and oriented to person, place, and time.  Psychiatric:        Attention and Perception: Attention and perception normal.        Mood and Affect: Mood is anxious.        Speech: Speech normal.        Behavior: Behavior normal. Behavior is cooperative.        Thought Content: Thought content normal.        Cognition and Memory: Cognition and memory normal.        Judgment: Judgment normal.   Review of Systems  Psychiatric/Behavioral:  The patient is nervous/anxious.   All other systems reviewed and are negative. Blood pressure 116/62, pulse 90, temperature 97.8 F (36.6 C), temperature source Oral, resp. rate 18, height 5\' 9"  (1.753 m), weight 63.5 kg, SpO2 96 %. Body  mass index is 20.67 kg/m.  Treatment Plan Summary: Bipolar affective disorder: Continue Abilify 2 mg daily Follow up with her psychiatrist  Anxiety: Continue hydroxyzine 10 mg TID  EPS: Continue Cogentin 0.5 mg daily   Disposition: Continue to see geriatric psych  Adriana MeansJamison Luis Sami, NP 07/13/2021 11:13 AM

## 2021-07-13 NOTE — Progress Notes (Signed)
Progress Note   Patient: Adriana Vasquez DOB: Apr 01, 1945 DOA: 07/09/2021     3 DOS: the patient was seen and examined on 07/13/2021   Brief hospital course: Adriana Vasquez is a 76 y.o. female with medical history significant of bipolar, former smoker, chronic venous insufficiency, who presented on 07/09/2021 with altered mental status, hallucinations and paranoid thoughts, and increased urinary frequency.  Found to have a UTI on evaluation in the ED. Started on empiric Rocephin pending cultures.  Psychiatry was consulted for the acute on chronic psychiatric symptoms and paranoid delusions which are felt due to the UTI.     Urine culture no significant growth. Patient was treated with 3 days Rocephin.   She is improved, afebrile and without any urinary symptoms.   Her psychiatric symptoms are still persistent, although slightly improved.    Assessment and Plan: * Acute psychosis ([place]) On admission, patient seemed to have acute psychosis with hallucinations and paranoid delusions.  No suicidal homicidal ideations.  Suspect worsening of underlying bipolar disorder in the setting of UTI.  UDS was negative, alcohol level <22, salicylates negative, acetaminophen level negative. --Psychiatry consulted --Patient given IV Ativan per psych --Continue home medications: Abilify, BuSpar, hydroxyzine, Cogentin for bipolar disorder.  Seems mildly improved.  She continues to state not feeling safe returning home due to a "neighbor issue".  Noted by staff to have some paranoia and delusional thinking over course of admission.  Per patient and confirmed by her brother this afternoon (5/21), her current mental state is far from her baseline, and he feels she requires psych attention and medication adjustments prior to feeling she is safe to return home.    5/21 - d/c to geri-psych unit yesterday evening was delayed due to staffing shortage and unable to admit the patient.  UTI (urinary  tract infection)-resolved as of 07/12/2021 Presented with urinary frequency and AMS/psychosis with hallucinations.   UA with moderate leukocytes, 11-20 WBCs. -- Continue empiric Rocephin -- Follow up urine culture  Bipolar 1 disorder ([place]) -Continue home medications: Abilify, BuSpar, hydroxyzine, Cogentin  Hypokalemia Potassium 3.4 this morning, replaced. Repeat BMP in 1 week        Subjective: Met with patient today. Explained situation with delay in admission to [facility].  She reports waking up this AM with severe anxiety and fear, very unusual for her when she is stable and doing well.  Brother agrees she is far from her baseline.  Both are frustrated.  They ask why meds are not being adjusted now, while she is here an waiting.  I indicated I would reach out and ask about that possibility.  Pt otherwise has no acute complaints.  She just says she knows she will not be okay at home until feeling better and more like herself.   Physical Exam: Vitals:   07/12/21 1602 07/12/21 1950 07/13/21 0442 07/13/21 0846  BP: 115/66 106/63 (!) 100/59 116/62  Pulse: 63 (!) 56 (!) 57 90  Resp: _0 Temp: 98 F (36.7 C) 98.4 F (36.9 C) 98.2 F (36.8 C) 97.8 F (36.6 C)  TempSrc: Oral   Oral  SpO2: 96% 95% 94% 96%  Weight:      Height:       General exam: awake, alert, no acute distress, nicely dressed HEENT: moist mucus membranes, hearing grossly normal with hearing aids in place, wearing glasses Respiratory system: On room air, normal respiratory effort. Cardiovascular system: No peripheral edema, RRR.   Central nervous system:  A&O x4. no gross focal neurologic deficits, normal speech Extremities: moves all, no edema, normal tone Skin: dry, intact, normal temperature Psychiatry: Anxious mood, congruent affect, judgement and insight appear normal, no apparent auditory or visual hallucinations at this time   Data Reviewed:  There are no new results to review at this time.  Family  Communication: Spoke with brother Richardson Landry by phone twice today  Disposition: Status is: Inpatient Remains inpatient appropriate because: Awaiting Geri psych placement   Planned Discharge Destination:  Geri psych    Time spent: 55 minutes including time spent bedside and in coordination of care with consultants and staff  Author: Ezekiel Slocumb, DO 07/13/2021 3:03 PM  For on call review [email or web address].

## 2021-07-13 NOTE — Consult Note (Signed)
This provider went to see the client again to address any concerns.  She reports some memory issues at times "and I have to write them down so I don't forget".  This started with her UTI on admission.  Explained how infections can cause confusion that waxes and wanes that she seems to be experiencing.  She does feel better and is visiting with a visitor who reiterated these things and how she has improved.  Explained this provider would re-evaluate her tomorrow and will consider discharge if she continues to improve as her delusions and paranoia have resolved.  Nanine Means, PMHNP

## 2021-07-14 DIAGNOSIS — F319 Bipolar disorder, unspecified: Secondary | ICD-10-CM | POA: Diagnosis not present

## 2021-07-14 LAB — CBC
HCT: 40.6 % (ref 36.0–46.0)
Hemoglobin: 13.2 g/dL (ref 12.0–15.0)
MCH: 30.5 pg (ref 26.0–34.0)
MCHC: 32.5 g/dL (ref 30.0–36.0)
MCV: 93.8 fL (ref 80.0–100.0)
Platelets: 226 10*3/uL (ref 150–400)
RBC: 4.33 MIL/uL (ref 3.87–5.11)
RDW: 13 % (ref 11.5–15.5)
WBC: 6.4 10*3/uL (ref 4.0–10.5)
nRBC: 0 % (ref 0.0–0.2)

## 2021-07-14 LAB — BASIC METABOLIC PANEL
Anion gap: 7 (ref 5–15)
BUN: 17 mg/dL (ref 8–23)
CO2: 28 mmol/L (ref 22–32)
Calcium: 8.8 mg/dL — ABNORMAL LOW (ref 8.9–10.3)
Chloride: 104 mmol/L (ref 98–111)
Creatinine, Ser: 0.72 mg/dL (ref 0.44–1.00)
GFR, Estimated: >60 mL/min (ref >60)
Glucose, Bld: 89 mg/dL (ref 70–99)
Potassium: 3.7 mmol/L (ref 3.5–5.1)
Sodium: 139 mmol/L (ref 135–145)

## 2021-07-14 LAB — MAGNESIUM: Magnesium: 2 mg/dL (ref 1.7–2.4)

## 2021-07-14 LAB — PHOSPHORUS: Phosphorus: 4 mg/dL (ref 2.5–4.6)

## 2021-07-14 MED ORDER — ARIPIPRAZOLE 5 MG PO TABS: 5.0000 mg | ORAL_TABLET | Freq: Every day | ORAL | 1 refills | Status: DC

## 2021-07-14 NOTE — Discharge Summary (Addendum)
Physician Discharge Summary   Patient: Adriana Vasquez MRN: [id number] DOB: 08-18-1945  Admit date:     07/09/2021  Discharge date: 07/15/2021  Discharge Physician: Pennie BanterKelly A Jamaiya Tunnell   PCP: Mick SellFitzgerald, David P, MD   Recommendations at discharge:    Follow up with psychiatry as soon as possible Follow up with primary care in 1-2 weeks Follow up on efficacy of psych medication regimen (Abilify dose was increased)  Discharge Diagnoses: Principal Problem:   Acute psychosis (HCC) Active Problems:   Bipolar 1 disorder (HCC)   Hypokalemia  Resolved Problems:   UTI (urinary tract infection)  Hospital Course: Adriana Vasquez is a 76 y.o. female with medical history significant of bipolar, former smoker, chronic venous insufficiency, who presented on 07/09/2021 with altered mental status, hallucinations and paranoid thoughts, and increased urinary frequency.  Found to have a UTI on evaluation in the ED. Started on empiric Rocephin pending cultures.  Psychiatry was consulted for the acute on chronic psychiatric symptoms and paranoid delusions which are felt due to the UTI.     Assessment and Plan: * Acute psychosis (HCC) On admission, patient seemed to have acute psychosis with hallucinations and paranoid delusions.  No suicidal homicidal ideations.  Suspect worsening of underlying bipolar disorder in the setting of UTI.  UDS was negative, alcohol level <10, salicylates negative, acetaminophen level negative. --Psychiatry consulted --Patient given IV Ativan per psych --Continue home medications: Abilify, BuSpar, hydroxyzine, Cogentin for bipolar disorder.  Seems mildly improved.  She continues to state not feeling safe returning home due to a "neighbor issue".  Noted by staff to have some paranoia and delusional thinking over course of admission.  Per patient and confirmed by her brother this afternoon (5/21), her current mental state is far from her baseline, and he feels she requires  psych attention and medication adjustments prior to feeling she is safe to return home.    5/21 - d/c to geri-psych unit yesterday evening was delayed due to staffing shortage and unable to admit the patient.  5/22 - psychiatry reassessed patient and no longer recommend inpatient admission, but close outpatient follow up with patient's psychiatrist.  Patient's psychiatric symptoms markedly improved during admission.  She is stable for discharge home.  UTI (urinary tract infection)-resolved as of 07/12/2021 Presented with urinary frequency and AMS/psychosis with hallucinations.   UA with moderate leukocytes, 11-20 WBCs. -- Continue empiric Rocephin -- Follow up urine culture  Bipolar 1 disorder (HCC) -Continue home medications: Abilify, BuSpar, hydroxyzine, Cogentin  Hypokalemia Potassium 3.4 this morning, replaced. Repeat BMP in 1 week         Consultants: Psychiatry Procedures performed: none  Disposition: Home Diet recommendation:  Discharge Diet Orders (From admission, onward)     Start     Ordered   07/12/21 0000  Diet - low sodium heart healthy        07/12/21 1331           Cardiac diet DISCHARGE MEDICATION: Allergies as of 07/14/2021   No Known Allergies      Medication List     STOP taking these medications    ARIPiprazole 2 MG tablet Commonly known as: ABILIFY       TAKE these medications    benztropine 0.5 MG tablet Commonly known as: COGENTIN TAKE 1 TABLET BY MOUTH AT BEDTIME AS NEEDED FOR TREMORS, RESTLESSNESS, OR ABNORMAL MOVEMENTS   busPIRone 10 MG tablet Commonly known as: BUSPAR Take 1 tablet (10 mg total) by mouth 3 (three) times  daily.   Calcium Carbonate-Vitamin D 600-400 MG-UNIT tablet Take by mouth.   gabapentin 300 MG capsule Commonly known as: NEURONTIN Take 1 capsule by mouth 3 (three) times daily.   hydrOXYzine 10 MG tablet Commonly known as: ATARAX Take 1-2 tablets (10-20 mg total) by mouth daily as needed. For  severe anxiety, agitation        Follow-up Information     Jomarie Longs, MD. Schedule an appointment as soon as possible for a visit.   Specialty: Psychiatry Why: First available appt for hospital follow up on uncontrolled bipolar symptoms not meeting criteria for inpatient psych admission.  Patient would also benefit from talk therapy with psychologist, please connect her with someone. Contact information: [address] [place] [address] [phone]         Mick Sell, MD. Schedule an appointment as soon as possible for a visit in 1 week(s).   Specialty: Infectious Diseases Why: Hospital follow up Contact information: [address] [place] [address] [phone]                Discharge Exam: Ceasar Mons Weights   07/09/21 1339  Weight: 63.5 kg   General exam: awake, alert, no acute distress, well-dressed HEENT: moist mucus membranes, hearing grossly normal  Respiratory system: on room air, normal respiratory effort. Cardiovascular system: normal S1/S2, RRR, no pedal edema.   Central nervous system: A&O x4. no gross focal neurologic deficits, normal speech Extremities: moves all, no edema, normal tone Skin: dry, intact, no rashes seen on visualized skin Psychiatry: normal mood, congruent affect, judgement and insight appear normal   Condition at discharge: stable  The results of significant diagnostics from this hospitalization (including imaging, microbiology, ancillary and laboratory) are listed below for reference.   Imaging Studies: DG Chest 2 View  Result Date: 07/09/2021 CLINICAL DATA:  Provided history: Shortness of breath. EXAM: CHEST - 2 VIEW COMPARISON:  Chest CT 06/03/2021. FINDINGS: Heart size within normal limits. Mild bibasilar atelectasis. No appreciable airspace consolidation. No evidence of pleural effusion or pneumothorax. No acute bony abnormality identified. Degenerative changes of the spine.  IMPRESSION: Mild bibasilar atelectasis. No appreciable airspace consolidation or pulmonary edema. Electronically Signed   By: Jackey Loge D.O.   On: 07/09/2021 14:26    Microbiology: Results for orders placed or performed during the hospital encounter of 07/09/21  Urine Culture     Status: Abnormal   Collection Time: 07/09/21  1:44 PM   Specimen: Urine, Clean Catch  Result Value Ref Range Status   Specimen Description   Final    URINE, CLEAN CATCH Performed at [facility], [address] [place], [place] [address]    Special Requests   Final    NONE Performed at [facility], [address] [place], [place] [address]    Culture (A)  Final    <10,000 COLONIES/mL INSIGNIFICANT GROWTH Performed at [facility], [address], [place], [place] [address]    Report Status 07/10/2021 FINAL  Final    Labs: CBC: No results for input(s): WBC, NEUTROABS, HGB, HCT, MCV, PLT in the last 168 hours.  Basic Metabolic Panel: No results for input(s): NA, K, CL, CO2, GLUCOSE, BUN, CREATININE, CALCIUM, MG, PHOS in the last 168 hours.  Liver Function Tests: No results for input(s): AST, ALT, ALKPHOS, BILITOT, PROT, ALBUMIN in the last 168 hours.  CBG: No results for input(s): GLUCAP in the last 168 hours.  Discharge time spent: less than  30 minutes.  Signed: Pennie Banter, DO [facility] 07/24/2021

## 2021-07-14 NOTE — Plan of Care (Signed)

## 2021-07-14 NOTE — Care Management Important Message (Signed)
Important Message  Patient Details  Name: Adriana Vasquez MRN: [id number] Date of Birth: 05-18-1945   Medicare Important Message Given:  N/A - LOS <3 / Initial given by admissions     Adriana Vasquez 07/14/2021, 10:40 AM

## 2021-07-14 NOTE — Consult Note (Signed)
[facility] Face-to-Face Psychiatry Consult   Reason for Consult:  paranoia Referring Physician:  Dr Denton Lank Patient Identification: Adriana Vasquez MRN:  [id number] Principal Diagnosis: Acute psychosis (HCC) Diagnosis:  Principal Problem:   Acute psychosis (HCC) Active Problems:   Bipolar 1 disorder (HCC)   Hypokalemia   Total Time spent with patient: 45 minutes  Subjective:   Adriana Vasquez is a 76 y.o. female patient admitted with medical issues, paranoia.  Client continues to be stable with no paranoia or delusions.  Tolerated the increase in her Abilify without side effects.  Pleasant on assessment and understands the plan to discharge and follow up with her psychiatrist, Dr Elna Breslow.  No suicidal/homicidal ideations or substance abuse.  "I'm doing ok, a little better (memory).  Once I got up and got my meds."  Explained how her memory will continue to improve and return to baseline.  No issues on assessment, psych cleared.  Her brother reported yesterday that he just needed a few hours to plan to retrieve her.    HPI:  76 yo female who presented to the ED with delusions and paranoia.  This provider saw her on Friday and she continued to have these symptoms.  Today on assessment, she does not feel this way anymore.  However, her brother does not feel she is at her baseline, see note below.  No paranoia and "I'm much better physically."  No mania symptoms or suicidal/homicidal ideations.  Reports "kinda anxious" on assessment, no panic attacks.    "She doesn't want to go home.  She's not stable.  She doesn't appear to be as paranoid.  When she was watching tv yesterday, she was seeing people she knew on tv.  She can fool you."  He continued to add that she was having some confusion as who she was and memory concerns, "she needs to be somewhere."  This provider let him know that she would be faxed out to hospitals for admission outside of [facility] based on bed availability and staffing.  Also explained  that she may stabilize prior to placement and then would look at discharge.  Past Psychiatric History: bipolar d/o  Risk to Self:  none Risk to Others:  none Prior Inpatient Therapy:  yes Prior Outpatient Therapy:  DR Elna Breslow  Past Medical History:  Past Medical History:  Diagnosis Date   Bipolar disorder (HCC)    Personal history of tobacco use, presenting hazards to health 01/22/2015   UTI (urinary tract infection) 07/09/2021    Past Surgical History:  Procedure Laterality Date   ABDOMINAL HYSTERECTOMY     HAND SURGERY Right 2001   Patient not sure of what exactly was done.  Repared index finger knuckle area.   Family History:  Family History  Problem Relation Age of Onset   Arthritis Mother    Heart attack Father    Diabetes Father    Bipolar disorder Father    Breast cancer Neg Hx    Family Psychiatric  History: see above Social History:  Social History   Substance and Sexual Activity  Alcohol Use No   Alcohol/week: 0.0 standard drinks     Social History   Substance and Sexual Activity  Drug Use No    Social History   Socioeconomic History   Marital status: Widowed    Spouse name: Not on file   Number of children: Not on file   Years of education: Not on file   Highest education level: Not on file  Occupational  History   Not on file  Tobacco Use   Smoking status: Former    Packs/day: 1.00    Years: 35.00    Pack years: 35.00    Types: Cigarettes    Start date: 09/27/1979   Smokeless tobacco: Never   Tobacco comments:    03/26/2018  Vaping Use   Vaping Use: Never used  Substance and Sexual Activity   Alcohol use: No    Alcohol/week: 0.0 standard drinks   Drug use: No   Sexual activity: Not Currently  Other Topics Concern   Not on file  Social History Narrative   Not on file   Social Determinants of Health   Financial Resource Strain: Not on file  Food Insecurity: Not on file  Transportation Needs: Not on file  Physical Activity: Not on  file  Stress: Not on file  Social Connections: Not on file   Additional Social History:    Allergies:  No Known Allergies  Labs:  Results for orders placed or performed during the hospital encounter of 07/09/21 (from the past 48 hour(s))  Basic metabolic panel     Status: Abnormal   Collection Time: 07/14/21  2:41 AM  Result Value Ref Range   Sodium 139 135 - 145 mmol/L   Potassium 3.7 3.5 - 5.1 mmol/L   Chloride 104 98 - 111 mmol/L   CO2 28 22 - 32 mmol/L   Glucose, Bld 89 70 - 99 mg/dL    Comment: Glucose reference range applies only to samples taken after fasting for at least 8 hours.   BUN 17 8 - 23 mg/dL   Creatinine, Ser 4.13 0.44 - 1.00 mg/dL   Calcium 8.8 (L) 8.9 - 10.3 mg/dL   GFR, Estimated >24 >40 mL/min    Comment: (NOTE) Calculated using the CKD-EPI Creatinine Equation (2021)    Anion gap 7 5 - 15    Comment: Performed at [facility], [address] [place], [place] [address]  Magnesium     Status: None   Collection Time: 07/14/21  2:41 AM  Result Value Ref Range   Magnesium 2.0 1.7 - 2.4 mg/dL    Comment: Performed at [facility], [address] [place], [place] [address]  Phosphorus     Status: None   Collection Time: 07/14/21  2:41 AM  Result Value Ref Range   Phosphorus 4.0 2.5 - 4.6 mg/dL    Comment: Performed at [facility], [address] [place], [place] [address]  CBC     Status: None   Collection Time: 07/14/21  2:41 AM  Result Value Ref Range   WBC 6.4 4.0 - 10.5 K/uL   RBC 4.33 3.87 - 5.11 MIL/uL   Hemoglobin 13.2 12.0 - 15.0 g/dL   HCT 42.5 95.6 - 38.7 %   MCV 93.8 80.0 - 100.0 fL   MCH 30.5 26.0 - 34.0 pg   MCHC 32.5 30.0 - 36.0 g/dL   RDW 56.4 33.2 - 95.1 %   Platelets 226 150 - 400 K/uL   nRBC 0.0 0.0 - 0.2 %    Comment: Performed at [facility], [address] [place], [place] [address]    Current Facility-Administered Medications  Medication Dose Route Frequency Provider Last Rate  Last Admin   acetaminophen (TYLENOL) tablet 650 mg  650 mg Oral Q6H PRN Lorretta Harp, MD   650 mg at 07/11/21 0859   ARIPiprazole (ABILIFY) tablet 5 mg  5 mg Oral Daily Nanine Means  Y, NP   5 mg at 07/14/21 0858   benztropine (COGENTIN) tablet 0.5 mg  0.5 mg Oral BID PRN Lorretta Harp, MD       busPIRone (BUSPAR) tablet 10 mg  10 mg Oral TID Lorretta Harp, MD   10 mg at 07/14/21 3419   calcium-vitamin D (OSCAL WITH D) 500-5 MG-MCG per tablet 1 tablet  1 tablet Oral Q breakfast Lorretta Harp, MD   1 tablet at 07/14/21 0858   enoxaparin (LOVENOX) injection 40 mg  40 mg Subcutaneous Q24H Lorretta Harp, MD   40 mg at 07/14/21 0020   gabapentin (NEURONTIN) capsule 300 mg  300 mg Oral TID Lorretta Harp, MD   300 mg at 07/14/21 6222   hydrOXYzine (ATARAX) tablet 10-20 mg  10-20 mg Oral Daily PRN Lorretta Harp, MD   10 mg at 07/13/21 2145   ondansetron (ZOFRAN) injection 4 mg  4 mg Intravenous Q8H PRN Lorretta Harp, MD       witch hazel-glycerin (TUCKS) pad   Topical PRN Pennie Banter, DO        Musculoskeletal: Strength & Muscle Tone: within normal limits Gait & Station: normal Patient leans: N/A  Psychiatric Specialty Exam: Physical Exam Vitals and nursing note reviewed.  Constitutional:      Appearance: Normal appearance.  HENT:     Head: Normocephalic.     Nose: Nose normal.  Pulmonary:     Effort: Pulmonary effort is normal.  Musculoskeletal:        General: Normal range of motion.     Cervical back: Normal range of motion.  Neurological:     General: No focal deficit present.     Mental Status: She is alert and oriented to person, place, and time.  Psychiatric:        Attention and Perception: Attention and perception normal.        Mood and Affect: Mood is anxious.        Speech: Speech normal.        Behavior: Behavior normal. Behavior is cooperative.        Thought Content: Thought content normal.        Cognition and Memory: Cognition and memory normal.        Judgment: Judgment normal.     Review of Systems  Psychiatric/Behavioral:  The patient is nervous/anxious.   All other systems reviewed and are negative.  Blood pressure 111/72, pulse 68, temperature 98.1 F (36.7 C), temperature source Oral, resp. rate 16, height 5\' 9"  (1.753 m), weight 63.5 kg, SpO2 99 %.Body mass index is 20.67 kg/m.  General Appearance: Casual  Eye Contact:  Good  Speech:  Normal Rate  Volume:  Normal  Mood:  Anxious  Affect:  Congruent  Thought Process:  Coherent and Descriptions of Associations: Intact  Orientation:  Full (Time, Place, and Person)  Thought Content:  WDL and Logical  Suicidal Thoughts:  No  Homicidal Thoughts:  No  Memory:  Immediate;   Good Recent;   Good Remote;   Good  Judgement:  Fair  Insight:  Fair  Psychomotor Activity:  Normal  Concentration:  Concentration: Good and Attention Span: Good  Recall:  Good  Fund of Knowledge:  Good  Language:  Good  Akathisia:  No  Handed:  Right  AIMS (if indicated):     Assets:  Housing Leisure Time Physical Health Resilience Social Support  ADL's:  Intact  Cognition:  WNL  Sleep:  Physical Exam: Physical Exam Vitals and nursing note reviewed.  Constitutional:      Appearance: Normal appearance.  HENT:     Head: Normocephalic.     Nose: Nose normal.  Pulmonary:     Effort: Pulmonary effort is normal.  Musculoskeletal:        General: Normal range of motion.     Cervical back: Normal range of motion.  Neurological:     General: No focal deficit present.     Mental Status: She is alert and oriented to person, place, and time.  Psychiatric:        Attention and Perception: Attention and perception normal.        Mood and Affect: Mood is anxious.        Speech: Speech normal.        Behavior: Behavior normal. Behavior is cooperative.        Thought Content: Thought content normal.        Cognition and Memory: Cognition and memory normal.        Judgment: Judgment normal.   Review of Systems   Psychiatric/Behavioral:  The patient is nervous/anxious.   All other systems reviewed and are negative. Blood pressure 111/72, pulse 68, temperature 98.1 F (36.7 C), temperature source Oral, resp. rate 16, height 5\' 9"  (1.753 m), weight 63.5 kg, SpO2 99 %. Body mass index is 20.67 kg/m.  Treatment Plan Summary: Bipolar affective disorder: Continue Abilify  mg daily Follow up with her psychiatrist  Anxiety: Continue hydroxyzine 10 mg TID  EPS: Continue Cogentin 0.5 mg daily   Disposition: Discharge home  Nanine MeansJamison Lacresia Darwish, NP 07/14/2021 10:27 AM

## 2021-07-14 NOTE — Progress Notes (Addendum)
Reviewed discharge instructions with pt and brother, Richardson Landry. Richardson Landry( brother) and pt verbalized understanding.  IV cath intact when removed. PT discharged with all personal belongings. Staff walked pt out. Pt transported to home via family vehicle.

## 2021-07-14 NOTE — TOC Progression Note (Signed)
Transition of Care [facility]) - Progression Note    Patient Details  Name: Adriana Vasquez MRN: [id number] Date of Birth: 07-18-1945  Transition of Care [facility]) CM/SW Contact  Maree Krabbe, LCSW Phone Number: 07/14/2021, 2:41 PM  Clinical Narrative:   Pt cleared by Psych plan is to dc home today.         Expected Discharge Plan and Services           Expected Discharge Date: 07/14/21                                     Social Determinants of Health (SDOH) Interventions    Readmission Risk Interventions     View : No data to display.

## 2021-07-16 ENCOUNTER — Encounter: Payer: Self-pay | Admitting: Psychiatry

## 2021-07-16 ENCOUNTER — Ambulatory Visit (INDEPENDENT_AMBULATORY_CARE_PROVIDER_SITE_OTHER): Payer: Medicare PPO | Admitting: Psychiatry

## 2021-07-16 VITALS — BP 116/73 | HR 84 | Temp 98.1°F | Wt 139.6 lb

## 2021-07-16 DIAGNOSIS — F5105 Insomnia due to other mental disorder: Secondary | ICD-10-CM

## 2021-07-16 DIAGNOSIS — F3163 Bipolar disorder, current episode mixed, severe, without psychotic features: Secondary | ICD-10-CM | POA: Diagnosis not present

## 2021-07-16 MED ORDER — ARIPIPRAZOLE 2 MG PO TABS: 3.0000 mg | ORAL_TABLET | Freq: Every day | ORAL | 1 refills | Status: DC

## 2021-07-16 MED ORDER — TRAZODONE HCL 50 MG PO TABS: 25.0000 mg | ORAL_TABLET | Freq: Every evening | ORAL | 1 refills | Status: DC | PRN

## 2021-07-16 NOTE — Progress Notes (Signed)
[facility] MD OP Progress Note  07/16/2021 1:44 PM Adriana Vasquez  MRN:  [id number]  Chief Complaint:  Chief Complaint  Patient presents with   Follow-up: 76 year old Caucasian female with history of bipolar disorder with recent UTI currently on antibiotics after a visit to the emergency department, presented for a follow-up visit.   HPI: Adriana Vasquez is a 76 year old Caucasian female, lives in [place], retired, widowed, has a history of bipolar disorder, insomnia was evaluated in office today.  Patient contacted the office reporting adverse side effects to the Abilify.  Patient today appeared to be slow, sluggish likely due to being on a higher dosage of Abilify.  Patient reports mood wise she is improving.  She however feels over medicated and would like a dosage of Abilify readjusted.  Patient continues to have anxiety about her neighbor causing problems, likely paranoid however reports she has been coping with it better than before and it does not bother her much.  Patient does report sleep problems.  Interested in starting a sleep medication.  Does struggle with falling asleep as well as maintaining sleep.  Likely also contributed to by her recent UTI.  Patient denies any suicidality or homicidality.  Reports appetite as fair.  Reports she continues to be on medications for UTI and agreeable to follow-up with her primary provider.  Reviewed notes per Dr. Geanie Logan 07/14/2021-patient with UTI on evaluation in the emergency department, started on empiric Rocephin.  Patient had Abilify dosage increased to 5 mg.  Patient also with hypokalemia-potassium was replaced.  Repeat BMP in 1 week.   Visit Diagnosis:    ICD-10-CM   1. Bipolar 1 disorder, mixed, severe (HCC)  F31.63 ARIPiprazole (ABILIFY) 2 MG tablet    traZODone (DESYREL) 50 MG tablet   With psychosis-improving    2. Insomnia due to mental disorder  F51.05 traZODone (DESYREL) 50 MG tablet   Mood      Past  Psychiatric History: Reviewed past psychiatric history from progress note on 03/22/2019.  Past trials of Geodon, Abilify, trazodone, hydroxyzine, BuSpar.  Past Medical History:  Past Medical History:  Diagnosis Date   Bipolar disorder [facility])    Personal history of tobacco use, presenting hazards to health 01/22/2015   UTI (urinary tract infection) 07/09/2021    Past Surgical History:  Procedure Laterality Date   ABDOMINAL HYSTERECTOMY     HAND SURGERY Right 2001   Patient not sure of what exactly was done.  Repared index finger knuckle area.    Family Psychiatric History: Reviewed family psychiatric history from progress note on 03/22/2019.  Family History:  Family History  Problem Relation Age of Onset   Arthritis Mother    Heart attack Father    Diabetes Father    Bipolar disorder Father    Breast cancer Neg Hx     Social History: Reviewed social history from progress note on 03/22/2019. Social History   Socioeconomic History   Marital status: Widowed    Spouse name: Not on file   Number of children: Not on file   Years of education: Not on file   Highest education level: Not on file  Occupational History   Not on file  Tobacco Use   Smoking status: Former    Packs/day: 1.00    Years: 35.00    Pack years: 35.00    Types: Cigarettes    Start date: 09/27/1979   Smokeless tobacco: Never   Tobacco comments:    03/26/2018  Vaping Use  Vaping Use: Never used  Substance and Sexual Activity   Alcohol use: No    Alcohol/week: 0.0 standard drinks   Drug use: No   Sexual activity: Not Currently  Other Topics Concern   Not on file  Social History Narrative   Not on file   Social Determinants of Health   Financial Resource Strain: Not on file  Food Insecurity: Not on file  Transportation Needs: Not on file  Physical Activity: Not on file  Stress: Not on file  Social Connections: Not on file    Allergies: No Known Allergies  Metabolic Disorder Labs: Lab Results   Component Value Date   HGBA1C 5.7 (H) 04/10/2020   MPG 117 04/10/2020   Lab Results  Component Value Date   PROLACTIN 20.3 04/10/2020   PROLACTIN 26.7 (H) 02/07/2015   Lab Results  Component Value Date   CHOL 218 (H) 12/07/2014   TRIG 153 (H) 12/07/2014   HDL 60 12/07/2014   LDLCALC 127 (H) 12/07/2014   No results found for: TSH  Therapeutic Level Labs: No results found for: LITHIUM No results found for: VALPROATE No components found for:  CBMZ  Current Medications: Current Outpatient Medications  Medication Sig Dispense Refill   ARIPiprazole (ABILIFY) 2 MG tablet Take 1.5 tablets (3 mg total) by mouth daily. 45 tablet 1   benztropine (COGENTIN) 0.5 MG tablet TAKE 1 TABLET BY MOUTH AT BEDTIME AS NEEDED FOR TREMORS, RESTLESSNESS, OR ABNORMAL MOVEMENTS 90 tablet 0   busPIRone (BUSPAR) 10 MG tablet Take 1 tablet (10 mg total) by mouth 3 (three) times daily. 270 tablet 2   Calcium Carbonate-Vitamin D 600-400 MG-UNIT per tablet Take by mouth.     gabapentin (NEURONTIN) 300 MG capsule Take 1 capsule by mouth 3 (three) times daily.     hydrOXYzine (ATARAX) 10 MG tablet Take 1-2 tablets (10-20 mg total) by mouth daily as needed. For severe anxiety, agitation 15 tablet 1   nitrofurantoin, macrocrystal-monohydrate, (MACROBID) 100 MG capsule Take by mouth.     traZODone (DESYREL) 50 MG tablet Take 0.5-1 tablets (25-50 mg total) by mouth at bedtime as needed for sleep. 45 tablet 1   No current facility-administered medications for this visit.     Musculoskeletal: Strength & Muscle Tone: within normal limits Gait & Station: normal Patient leans: N/A  Psychiatric Specialty Exam: Review of Systems  Genitourinary:  Positive for dysuria.       Burning-improving  Psychiatric/Behavioral:  Positive for sleep disturbance. The patient is nervous/anxious.        Paranoia-improving  All other systems reviewed and are negative.  Blood pressure 116/73, pulse 84, temperature 98.1 F (36.7  C), temperature source Temporal, weight 139 lb 9.6 oz (63.3 kg).Body mass index is 20.62 kg/m.  General Appearance: Casual  Eye Contact:  Fair  Speech:  Clear and Coherent  Volume:  Normal  Mood:  Anxious  Affect:  Appropriate  Thought Process:  Goal Directed and Descriptions of Associations: Intact  Orientation:  Full (Time, Place, and Person)  Thought Content: Paranoid Ideation improving  Suicidal Thoughts:  No  Homicidal Thoughts:  No  Memory:  Immediate;   Fair Recent;   Fair Remote;   Fair  Judgement:  Fair  Insight:  Fair  Psychomotor Activity:  Decreased  Concentration:  Concentration: Fair and Attention Span: Fair  Recall:  [employer] of Knowledge: Fair  Language: Fair  Akathisia:  No  Handed:  Right  AIMS (if indicated): done  Assets:  Communication  Skills Desire for Improvement Housing Social Support Transportation  ADL's:  Intact  Cognition: WNL  Sleep:  Poor   Screenings: AIMS    Flowsheet Row Office Visit from 07/16/2021 in [facility] [facility] Office Visit from 07/09/2021 in [place] Office Visit from 05/05/2021 in [place] Office Visit from 01/14/2021 in [facility] Office Visit from 10/04/2017 in [place] Total Score 0 0 0 0 [place] Visit from 08/13/2020 in [place]  Total GAD-7 Score 2      PHQ2-9    [place] Office Visit from 07/09/2021 in [place] Office Visit from 05/05/2021 in [place] Video Visit from 03/06/2021 in [place] Visit from 01/14/2021 in [place] Office Visit from 08/13/2020 in [facility]  PHQ-2 Total Score 2 1 0 0 0  PHQ-9 Total Score 11 3 -- 4 2       Flowsheet Row ED to Hosp-Admission (Discharged) from 07/09/2021 in [facility] (1A) Most recent reading at 07/09/2021  1:39 PM Office Visit from 07/09/2021 in [facility] Most recent reading at 07/09/2021 12:52 PM Office Visit from 01/14/2021 in [facility] Most recent reading at 01/14/2021 11:16 AM  C-SSRS RISK CATEGORY No Risk No Risk No Risk        Assessment and Plan: Adriana Vasquez is a 76 year old Caucasian female, widowed, retired, lives in [place], currently on antibiotic for recent UTI and recent increase of her Abilify after being evaluated in the emergency department recently for worsening mood symptoms, psychosis, presented for a follow-up appointment.  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 daily. Cogentin 0.5 mg at bedtime as needed for leg cramps. Continue BuSpar 10 mg p.o. 3 times daily   Insomnia-unstable Start trazodone 25-50 mg p.o. nightly as needed Continue hydroxyzine 10 mg 1 to 2 tablets as needed for anxiety. Also on gabapentin which should also help to relax her, although she takes it for her pain.  Reviewed notes per most recent visit to the emergency department-Per Dr. Arbutus Ped dated 07/09/2021 as noted above. Patient advised to follow up with her primary care provider for continued management of her UTI as well as repeat BMP.  Follow-up in clinic in 2 to 3 weeks or sooner if needed.   This note was generated in part or whole with voice recognition software. Voice recognition is usually quite accurate but there are transcription errors that can and very often do occur. I apologize for any typographical errors that were not detected and corrected.      Ursula Alert, MD 07/16/2021, 1:44 PM

## 2021-07-16 NOTE — Patient Instructions (Signed)
START TAKING ABILIFY 3 MG DAILY. START TAKING TRAZODONE 25-50 MG AT BEDTIME AS NEEDED FOR SLEEP. CONTINUE ALL OTHER MEDICATIONS AS PRESCRIBED.

## 2021-07-29 ENCOUNTER — Ambulatory Visit (INDEPENDENT_AMBULATORY_CARE_PROVIDER_SITE_OTHER): Payer: Medicare PPO | Admitting: Psychiatry

## 2021-07-29 ENCOUNTER — Encounter: Payer: Self-pay | Admitting: Psychiatry

## 2021-07-29 VITALS — BP 110/70 | HR 77 | Temp 97.9°F | Wt 139.0 lb

## 2021-07-29 DIAGNOSIS — F3163 Bipolar disorder, current episode mixed, severe, without psychotic features: Secondary | ICD-10-CM | POA: Diagnosis not present

## 2021-07-29 DIAGNOSIS — F5105 Insomnia due to other mental disorder: Secondary | ICD-10-CM

## 2021-07-29 MED ORDER — ARIPIPRAZOLE 2 MG PO TABS: 3.0000 mg | ORAL_TABLET | Freq: Every day | ORAL | 1 refills | Status: DC

## 2021-07-29 NOTE — Progress Notes (Unsigned)
[facility] MD OP Progress Note  07/29/2021 3:42 PM Adriana Vasquez  MRN:  [id number]  Chief Complaint:  Chief Complaint  Patient presents with   Follow-up: 76 year old Caucasian female with history of bipolar disorder, recent UTI, possible side effects to Abilify, insomnia, paranoia presented for medication management   HPI: Adriana Vasquez is a 76 year old Caucasian female, lives in [place], retired, widowed, has a history of bipolar disorder, insomnia was evaluated in office today.  Patient presented along with her brother Richardson Landry, who provided collateral information.  Patient according to brother is currently taking the Abilify during the day and sleeping during the day.  Patient goes to bed after taking hydroxyzine.  She stays in bed only until around 4 AM and is up.  She called her brother last night saying that she believes someone stole a brown envelop from her apartment.  She however was able to look for it and find it today which helped.  Hence brother reports she continues to be paranoid.    Patient today appeared to be alert, oriented to person place and time.  Patient reports that she believes the Abilify is making her sluggish and drowsy.  She hence needs a nap during the day.  She does not know how many hours she sleeps.    Does have sadness, although improving on the current medication regimen.  Denies any suicidality or homicidality.  Patient denies any other concerns today.  Visit Diagnosis:    ICD-10-CM   1. Bipolar 1 disorder, mixed, severe (HCC)  F31.63 ARIPiprazole (ABILIFY) 2 MG tablet   With psychosis    2. Insomnia due to mental disorder  F51.05       Past Psychiatric History: Reviewed past psychiatric history from progress note on 03/22/2019.  Past trials of Geodon, Abilify, trazodone, hydroxyzine, BuSpar.  Past Medical History:  Past Medical History:  Diagnosis Date   Bipolar disorder [facility])    Personal history of tobacco use, presenting hazards to health  01/22/2015   UTI (urinary tract infection) 07/09/2021    Past Surgical History:  Procedure Laterality Date   ABDOMINAL HYSTERECTOMY     HAND SURGERY Right 2001   Patient not sure of what exactly was done.  Repared index finger knuckle area.    Family Psychiatric History: Reviewed family psychiatric history from progress note on 03/22/2019.  Family History:  Family History  Problem Relation Age of Onset   Arthritis Mother    Heart attack Father    Diabetes Father    Bipolar disorder Father    Breast cancer Neg Hx     Social History: Reviewed social history from progress note on 03/22/2019. Social History   Socioeconomic History   Marital status: Widowed    Spouse name: Not on file   Number of children: Not on file   Years of education: Not on file   Highest education level: Not on file  Occupational History   Not on file  Tobacco Use   Smoking status: Former    Packs/day: 1.00    Years: 35.00    Pack years: 35.00    Types: Cigarettes    Start date: 09/27/1979   Smokeless tobacco: Never   Tobacco comments:    03/26/2018  Vaping Use   Vaping Use: Never used  Substance and Sexual Activity   Alcohol use: No    Alcohol/week: 0.0 standard drinks   Drug use: No   Sexual activity: Not Currently  Other Topics Concern   Not  on file  Social History Narrative   Not on file   Social Determinants of Health   Financial Resource Strain: Not on file  Food Insecurity: Not on file  Transportation Needs: Not on file  Physical Activity: Not on file  Stress: Not on file  Social Connections: Not on file    Allergies: No Known Allergies  Metabolic Disorder Labs: Lab Results  Component Value Date   HGBA1C 5.7 (H) 04/10/2020   MPG 117 04/10/2020   Lab Results  Component Value Date   PROLACTIN 20.3 04/10/2020   PROLACTIN 26.7 (H) 02/07/2015   Lab Results  Component Value Date   CHOL 218 (H) 12/07/2014   TRIG 153 (H) 12/07/2014   HDL 60 12/07/2014   LDLCALC 127 (H)  12/07/2014   No results found for: TSH  Therapeutic Level Labs: No results found for: LITHIUM No results found for: VALPROATE No components found for:  CBMZ  Current Medications: Current Outpatient Medications  Medication Sig Dispense Refill   benztropine (COGENTIN) 0.5 MG tablet TAKE 1 TABLET BY MOUTH AT BEDTIME AS NEEDED FOR TREMORS, RESTLESSNESS, OR ABNORMAL MOVEMENTS 90 tablet 0   busPIRone (BUSPAR) 10 MG tablet Take 1 tablet (10 mg total) by mouth 3 (three) times daily. 270 tablet 2   Calcium Carbonate-Vitamin D 600-400 MG-UNIT per tablet Take by mouth.     gabapentin (NEURONTIN) 300 MG capsule Take 1 capsule by mouth 3 (three) times daily.     hydrOXYzine (ATARAX) 10 MG tablet Take 1-2 tablets (10-20 mg total) by mouth daily as needed. For severe anxiety, agitation 15 tablet 1   traZODone (DESYREL) 50 MG tablet Take 0.5-1 tablets (25-50 mg total) by mouth at bedtime as needed for sleep. 45 tablet 1   ARIPiprazole (ABILIFY) 2 MG tablet Take 1.5 tablets (3 mg total) by mouth at bedtime. 45 tablet 1   No current facility-administered medications for this visit.     Musculoskeletal: Strength & Muscle Tone: within normal limits Gait & Station: normal Patient leans: N/A  Psychiatric Specialty Exam: Review of Systems  Psychiatric/Behavioral:  Positive for dysphoric mood and sleep disturbance.   All other systems reviewed and are negative.  Blood pressure 110/70, pulse 77, temperature 97.9 F (36.6 C), temperature source Temporal, weight 139 lb (63 kg).Body mass index is 20.53 kg/m.  General Appearance: Casual  Eye Contact:  Fair  Speech:  Clear and Coherent  Volume:  Normal  Mood:  Dysphoric  Affect:  Appropriate  Thought Process:  Goal Directed and Descriptions of Associations: Intact  Orientation:  Full (Time, Place, and Person)  Thought Content: Paranoid Ideation   Suicidal Thoughts:  No  Homicidal Thoughts:  No  Memory:  Immediate;   Fair Recent;   Fair Remote;    Fair  Judgement:  Fair  Insight:  Fair  Psychomotor Activity:  Normal  Concentration:  Concentration: Fair and Attention Span: Fair  Recall:  [employer] of Knowledge: Fair  Language: Fair  Akathisia:  No  Handed:  Right  AIMS (if indicated): done  Assets:  Communication Skills Desire for Improvement Housing Social Support  ADL's:  Intact  Cognition: WNL  Sleep:  Poor   Screenings: [place] Office Visit from 07/16/2021 in [place] Office Visit from 07/09/2021 in [facility] Visit from 05/05/2021 in [place] Visit from 01/14/2021 in [place] Visit from 10/04/2017 in [place]  Total Score 0 0 0 0 0      GAD-7    Flowsheet Row Office Visit from 08/13/2020 in [place]  Total GAD-7 Score 2      PHQ2-9    [place] Office Visit from 07/29/2021 in [place] Office Visit from 07/09/2021 in [place] Office Visit from 05/05/2021 in [place] Video Visit from 03/06/2021 in [place] Office Visit from 01/14/2021 in [place]  PHQ-2 Total Score 3 2 1  0 0  PHQ-9 Total Score 15 11 3  -- [place] Office Visit from 07/29/2021 in [facility] Most recent reading at 07/29/2021  3:02 PM ED to Hosp-Admission (Discharged) from 07/09/2021 in [facility] (1A) Most recent reading at 07/09/2021  1:39 PM Office Visit from 07/09/2021 in [facility] Most recent reading at 07/09/2021 12:52 PM  C-SSRS RISK CATEGORY No Risk No Risk No Risk        Assessment and Plan: LABERTA EGELAND is a 76 year old Caucasian female, widowed, retired,  lives in [place], currently having side effects to Abilify as well as continues to have paranoia, sleep problems, will benefit from the following plan.  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 daily at bedtime Continue Cogentin 0.5 mg at bedtime as needed for side effects of Abilify BuSpar 10 mg p.o. 3 times daily  Insomnia-unstable Patient has not started taking the trazodone which was prescribed last visit. Could start trazodone 25-50 mg p.o. nightly as needed. However she could continue hydroxyzine 10 mg 1 to 2 tablets as needed for anxiety and sleep at night.  Advised patient not to combine trazodone and hydroxyzine together. She is also on gabapentin which she takes for pain, she could continue the same.  Collateral information obtained from brother-TU as noted above.  Follow-up in clinic in 1 week or sooner if needed.  This note was generated in part or whole with voice recognition software. Voice recognition is usually quite accurate but there are transcription errors that can and very often do occur. I apologize for any typographical errors that were not detected and corrected.     Ursula Alert, MD 07/29/2021, 3:42 PM

## 2021-07-29 NOTE — Patient Instructions (Addendum)
Start taking Abilify 3 mg at bedtime. Start taking vistaril ( hydroxyzine) 1-2 tablets at bedtime as needed for sleep along with Abilify. If that does not work ,please take Trazodone 25- 50 mg at bedtime as needed with Abilify. Do not take Hydroxyzine at night if you are taking Trazodone.

## 2021-08-05 ENCOUNTER — Ambulatory Visit (INDEPENDENT_AMBULATORY_CARE_PROVIDER_SITE_OTHER): Payer: Medicare PPO | Admitting: Psychiatry

## 2021-08-05 ENCOUNTER — Telehealth: Payer: Self-pay | Admitting: Psychiatry

## 2021-08-05 ENCOUNTER — Encounter: Payer: Self-pay | Admitting: Psychiatry

## 2021-08-05 VITALS — BP 102/61 | HR 66 | Temp 98.3°F | Wt 137.0 lb

## 2021-08-05 DIAGNOSIS — F5105 Insomnia due to other mental disorder: Secondary | ICD-10-CM

## 2021-08-05 DIAGNOSIS — G3184 Mild cognitive impairment, so stated: Secondary | ICD-10-CM

## 2021-08-05 DIAGNOSIS — F3177 Bipolar disorder, in partial remission, most recent episode mixed: Secondary | ICD-10-CM | POA: Diagnosis not present

## 2021-08-05 NOTE — Progress Notes (Unsigned)
BH MD OP Progress Note  08/05/2021 5:08 PM Adriana Vasquez  MRN:  [id number]  Chief Complaint:  Chief Complaint  Patient presents with   Follow-up: 76 year old Caucasian female with history of bipolar disorder, recent UTI, paranoia and sleep problems, presented for medication management.   HPI: Adriana Vasquez is a 76 year old Caucasian female, lives in [place], retired, widowed, has a history of bipolar disorder, insomnia was evaluated in office today.  Patient as well as her brother Adriana Vasquez, participated in the evaluation today.  Patient today appeared to be alert, oriented to person place time situation.  Patient reports although she continues to have delusions and paranoia she is not too preoccupied with it and is able to let it go and is not too fixated.  Patient continues to be compliant on her medications like Abilify.  Denies side effects.  Does report flashes of light in her visual field, may have last seen it 3 days ago-currently follows up with eye provider and was recommended she follow up again in a month.  Unknown if this has anything to do with her visual disturbances or not.  Patient reports last night she was able to take Abilify along with the trazodone.  She tried that for the last 2 nights.  Last night for the first time she was able to sleep 7 to 8 hours straight.  Previously she was waking up an hour or so earlier.  She is interested in continuing the Abilify with the trazodone.  Patient with concerns of short-term memory loss, collateral information obtained from brother who also seems to be concerned about her memory problems. A SLUMS was completed in session today, she scored 22 out of 30.  Will refer patient for neurological consultation.  Patient denies any suicidality, homicidality or perceptual disturbances.    Visit Diagnosis:    ICD-10-CM   1. Bipolar 1 disorder, mixed, partial remission (HCC)  F31.77    with psychosis    2. Insomnia due to mental disorder   F51.05    Mood    3. Mild neurocognitive disorder  G31.84 hydrOXYzine (ATARAX) 10 MG tablet      Past Psychiatric History: Reviewed past psychiatric history from progress note on 03/22/2019.  Past trials of Geodon, Abilify, trazodone, hydroxyzine, BuSpar.  Past Medical History:  Past Medical History:  Diagnosis Date   Bipolar disorder [facility](HCC)    Personal history of tobacco use, presenting hazards to health 01/22/2015   UTI (urinary tract infection) 07/09/2021    Past Surgical History:  Procedure Laterality Date   ABDOMINAL HYSTERECTOMY     HAND SURGERY Right 2001   Patient not sure of what exactly was done.  Repared index finger knuckle area.    Family Psychiatric History: Reviewed family psychiatric history from progress note on 03/22/2019.  Family History:  Family History  Problem Relation Age of Onset   Arthritis Mother    Heart attack Father    Diabetes Father    Bipolar disorder Father    Breast cancer Neg Hx     Social History: Reviewed social history from progress note on 03/22/2019. Social History   Socioeconomic History   Marital status: Widowed    Spouse name: Not on file   Number of children: Not on file   Years of education: Not on file   Highest education level: Not on file  Occupational History   Not on file  Tobacco Use   Smoking status: Former    Packs/day: 1.00  Years: 35.00    Total pack years: 35.00    Types: Cigarettes    Start date: 09/27/1979   Smokeless tobacco: Never   Tobacco comments:    03/26/2018  Vaping Use   Vaping Use: Never used  Substance and Sexual Activity   Alcohol use: No    Alcohol/week: 0.0 standard drinks of alcohol   Drug use: No   Sexual activity: Not Currently  Other Topics Concern   Not on file  Social History Narrative   Not on file   Social Determinants of Health   Financial Resource Strain: Low Risk  (03/15/2017)   Overall Financial Resource Strain (CARDIA)    Difficulty of Paying Living Expenses: Not hard  at all  Food Insecurity: No Food Insecurity (03/15/2017)   Hunger Vital Sign    Worried About Running Out of Food in the Last Year: Never true    Ran Out of Food in the Last Year: Never true  Transportation Needs: No Transportation Needs (03/15/2017)   PRAPARE - Administrator, Civil Service (Medical): No    Lack of Transportation (Non-Medical): No  Physical Activity: Not on file  Stress: Not on file  Social Connections: Not on file    Allergies: No Known Allergies  Metabolic Disorder Labs: Lab Results  Component Value Date   HGBA1C 5.7 (H) 04/10/2020   MPG 117 04/10/2020   Lab Results  Component Value Date   PROLACTIN 20.3 04/10/2020   PROLACTIN 26.7 (H) 02/07/2015   Lab Results  Component Value Date   CHOL 218 (H) 12/07/2014   TRIG 153 (H) 12/07/2014   HDL 60 12/07/2014   LDLCALC 127 (H) 12/07/2014   No results found for: "TSH"  Therapeutic Level Labs: No results found for: "LITHIUM" No results found for: "VALPROATE" No results found for: "CBMZ"  Current Medications: Current Outpatient Medications  Medication Sig Dispense Refill   ARIPiprazole (ABILIFY) 2 MG tablet Take 1.5 tablets (3 mg total) by mouth at bedtime. 45 tablet 1   benztropine (COGENTIN) 0.5 MG tablet TAKE 1 TABLET BY MOUTH AT BEDTIME AS NEEDED FOR TREMORS, RESTLESSNESS, OR ABNORMAL MOVEMENTS 90 tablet 0   busPIRone (BUSPAR) 10 MG tablet Take 1 tablet (10 mg total) by mouth 3 (three) times daily. 270 tablet 2   Calcium Carbonate-Vitamin D 600-400 MG-UNIT per tablet Take by mouth.     gabapentin (NEURONTIN) 300 MG capsule Take 1 capsule by mouth 3 (three) times daily.     traZODone (DESYREL) 50 MG tablet Take 0.5-1 tablets (25-50 mg total) by mouth at bedtime as needed for sleep. 45 tablet 1   hydrOXYzine (ATARAX) 10 MG tablet Take 1-2 tablets (10-20 mg total) by mouth daily as needed. For severe anxiety, agitation 15 tablet 1   No current facility-administered medications for this visit.      Musculoskeletal: Strength & Muscle Tone: within normal limits Gait & Station: normal Patient leans: N/A  Psychiatric Specialty Exam: Review of Systems  Psychiatric/Behavioral:  Positive for hallucinations and sleep disturbance. The patient is nervous/anxious.   All other systems reviewed and are negative.   Blood pressure 102/61, pulse 66, temperature 98.3 F (36.8 C), temperature source Temporal, weight 137 lb (62.1 kg).Body mass index is 20.23 kg/m.  General Appearance: Casual  Eye Contact:  Fair  Speech:  Clear and Coherent  Volume:  Normal  Mood:  Anxious improving  Affect:  Congruent  Thought Process:  Goal Directed and Descriptions of Associations: Intact  Orientation:  Full (Time,  Place, and Person)  Thought Content: Delusions and Paranoid Ideation , also reports flashes of light in her visual field , (follows up with eye provider)  Suicidal Thoughts:  No  Homicidal Thoughts:  No  Memory:  Immediate;   Fair Recent;   Fair Remote;   Fair reports short term memory loss  Judgement:  Fair  Insight:  Fair  Psychomotor Activity:  Normal  Concentration:  Concentration: Fair and Attention Span: Fair  Recall:  [employer] of Knowledge: Fair  Language: Fair  Akathisia:  No  Handed:  Right  AIMS (if indicated): done  Assets:  Communication Skills Desire for Improvement Housing Social Support Transportation  ADL's:  Intact  Cognition: impaired unspecified  Sleep:   improving   Screenings: AIMS    Flowsheet Row Office Visit from 08/05/2021 in [facility] Office Visit from 07/29/2021 in [facility] Office Visit from 07/16/2021 in [facility] Office Visit from 07/09/2021 in [facility] Office Visit from 05/05/2021 in [facility]  AIMS Total Score 0 0 0 0 0      GAD-7    Flowsheet Row Office Visit from 08/13/2020 in [facility]  Total GAD-7 Score 2      PHQ2-9    Flowsheet Row Office Visit from 07/29/2021 in [facility] Office Visit from 07/09/2021 in [facility] Office Visit from 05/05/2021 in [facility] Video Visit from 03/06/2021 in [facility] Office Visit from 01/14/2021 in [facility]  PHQ-2 Total Score 3 2 1  0 0  PHQ-9 Total Score 15 11 3  -- 4      Flowsheet Row Office Visit from 07/29/2021 in [facility] Most recent reading at 07/29/2021  3:02 PM ED to Hosp-Admission (Discharged) from 07/09/2021 in [facility] (1A) Most recent reading at 07/09/2021  1:39 PM Office Visit from 07/09/2021 in [facility] Most recent reading at 07/09/2021 12:52 PM  C-SSRS RISK CATEGORY No Risk No Risk No Risk        Assessment and Plan: PAQUITA PRINTY is a 76 year old Caucasian female, widowed, retired, lives in [place], currently with short-term memory problems, paranoia (improving), will benefit from the following plan.  Plan Bipolar disorder type I severe in partial remission Continue Abilify 3 mg p.o. daily at bedtime Cogentin 0.5 mg at bedtime as needed for side effects of Abilify BuSpar 10 mg p.o. daily 3 times a day  Insomnia-improving Trazodone 25-50 mg p.o. nightly as needed Hydroxyzine 10 mg 1 to 2 tablets as needed for severe anxiety, sleep problems.  Mild neurocognitive disorder-unstable Will refer for neurology consultation Completed SLUMS today-22/30.  Collateral information obtained from brother as noted above.  Patient encouraged to follow up with her eye provider.  Follow-up in clinic in 3 to 4 weeks or sooner if needed.  This note was generated in part or whole with voice recognition software. Voice recognition is usually quite accurate  but there are transcription errors that can and very often do occur. I apologize for any typographical errors that were not detected and corrected.     [place], MD 08/06/2021, 9:22 AM

## 2021-08-05 NOTE — Telephone Encounter (Signed)
Patient needs Neurology referral for memory problems

## 2021-08-06 ENCOUNTER — Telehealth: Payer: Self-pay | Admitting: Psychiatry

## 2021-08-06 MED ORDER — HYDROXYZINE HCL 10 MG PO TABS: 10.0000 mg | ORAL_TABLET | Freq: Every day | ORAL | 1 refills | Status: DC | PRN
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
## 2021-08-06 NOTE — Telephone Encounter (Signed)
Spoke to patient.  Patient reports she had possible tremors in her body last night after she took the trazodone.  She also continues to have paranoia-ongoing since the past several weeks.  Patient advised to take half tablet of the trazodone tonight unknown if the higher dosage of trazodone is causing the side effects.  Patient denies any suicidality or homicidality.  However does report she is worried about leaving her apartment.  She has had these kind of anxiety previously however she was making progress.  Spoke to brother [place] away from patient.  Patient does not have any support system other than her brother.  As per discussion with brother likely anxiety component and attention seeking component going on since each time the brother goes away patient gets worse. However discussed with brother that patient may need hospitalization if she continues to get worse.   Patient as well as brother agrees with plan to go to the nearest emergency department if symptoms gets worse.

## 2021-08-07 ENCOUNTER — Emergency Department
Admission: EM | Admit: 2021-08-07 | Discharge: 2021-08-08 | Disposition: A | Discharge status: Other Acute Inpatient Hospital | Payer: Medicare PPO | Attending: Student in an Organized Health Care Education/Training Program | Admitting: Student in an Organized Health Care Education/Training Program

## 2021-08-07 DIAGNOSIS — F3163 Bipolar disorder, current episode mixed, severe, without psychotic features: Secondary | ICD-10-CM | POA: Diagnosis present

## 2021-08-07 DIAGNOSIS — Z20822 Contact with and (suspected) exposure to covid-19: Secondary | ICD-10-CM | POA: Diagnosis not present

## 2021-08-07 DIAGNOSIS — F411 Generalized anxiety disorder: Secondary | ICD-10-CM | POA: Diagnosis present

## 2021-08-07 DIAGNOSIS — R45851 Suicidal ideations: Secondary | ICD-10-CM | POA: Diagnosis not present

## 2021-08-07 DIAGNOSIS — Z046 Encounter for general psychiatric examination, requested by authority: Secondary | ICD-10-CM | POA: Diagnosis present

## 2021-08-07 DIAGNOSIS — I4581 Long QT syndrome: Secondary | ICD-10-CM | POA: Diagnosis not present

## 2021-08-07 DIAGNOSIS — F22 Delusional disorders: Secondary | ICD-10-CM | POA: Diagnosis not present

## 2021-08-07 DIAGNOSIS — F319 Bipolar disorder, unspecified: Secondary | ICD-10-CM | POA: Diagnosis present

## 2021-08-07 DIAGNOSIS — Z9189 Other specified personal risk factors, not elsewhere classified: Secondary | ICD-10-CM

## 2021-08-07 DIAGNOSIS — F419 Anxiety disorder, unspecified: Secondary | ICD-10-CM | POA: Diagnosis present

## 2021-08-07 DIAGNOSIS — Z634 Disappearance and death of family member: Secondary | ICD-10-CM

## 2021-08-07 DIAGNOSIS — F3161 Bipolar disorder, current episode mixed, mild: Secondary | ICD-10-CM | POA: Diagnosis present

## 2021-08-07 LAB — URINE DRUG SCREEN, QUALITATIVE (ARMC ONLY)
Amphetamines, Ur Screen: NOT DETECTED
Barbiturates, Ur Screen: NOT DETECTED
Benzodiazepine, Ur Scrn: NOT DETECTED
Cannabinoid 50 Ng, Ur ~~LOC~~: NOT DETECTED
Cocaine Metabolite,Ur ~~LOC~~: NOT DETECTED
MDMA (Ecstasy)Ur Screen: NOT DETECTED
Methadone Scn, Ur: NOT DETECTED
Opiate, Ur Screen: NOT DETECTED
Phencyclidine (PCP) Ur S: NOT DETECTED
Tricyclic, Ur Screen: NOT DETECTED

## 2021-08-07 LAB — URINALYSIS, ROUTINE W REFLEX MICROSCOPIC
Bacteria, UA: NONE SEEN
Bilirubin Urine: NEGATIVE
Glucose, UA: NEGATIVE mg/dL
Hgb urine dipstick: NEGATIVE
Ketones, ur: NEGATIVE mg/dL
Nitrite: NEGATIVE
Protein, ur: NEGATIVE mg/dL
Specific Gravity, Urine: 1.003 — ABNORMAL LOW (ref 1.005–1.030)
pH: 7 (ref 5.0–8.0)

## 2021-08-07 LAB — RESP PANEL BY RT-PCR (FLU A&B, COVID) ARPGX2
Influenza A by PCR: NEGATIVE
Influenza B by PCR: NEGATIVE
SARS Coronavirus 2 by RT PCR: NEGATIVE

## 2021-08-07 LAB — COMPREHENSIVE METABOLIC PANEL
ALT: 24 U/L (ref 0–44)
AST: 29 U/L (ref 15–41)
Albumin: 3.7 g/dL (ref 3.5–5.0)
Alkaline Phosphatase: 63 U/L (ref 38–126)
Anion gap: 7 (ref 5–15)
BUN: 9 mg/dL (ref 8–23)
CO2: 26 mmol/L (ref 22–32)
Calcium: 8.8 mg/dL — ABNORMAL LOW (ref 8.9–10.3)
Chloride: 104 mmol/L (ref 98–111)
Creatinine, Ser: 0.8 mg/dL (ref 0.44–1.00)
GFR, Estimated: >60 mL/min (ref >60)
Glucose, Bld: 109 mg/dL — ABNORMAL HIGH (ref 70–99)
Potassium: 3.5 mmol/L (ref 3.5–5.1)
Sodium: 137 mmol/L (ref 135–145)
Total Bilirubin: 0.6 mg/dL (ref 0.3–1.2)
Total Protein: 7.1 g/dL (ref 6.5–8.1)

## 2021-08-07 LAB — ACETAMINOPHEN LEVEL: Acetaminophen (Tylenol), Serum: <10 ug/mL — ABNORMAL LOW (ref 10–30)

## 2021-08-07 LAB — CBC
HCT: 38.4 % (ref 36.0–46.0)
Hemoglobin: 12.5 g/dL (ref 12.0–15.0)
MCH: 30.3 pg (ref 26.0–34.0)
MCHC: 32.6 g/dL (ref 30.0–36.0)
MCV: 93.2 fL (ref 80.0–100.0)
Platelets: 228 10*3/uL (ref 150–400)
RBC: 4.12 MIL/uL (ref 3.87–5.11)
RDW: 13.1 % (ref 11.5–15.5)
WBC: 7.3 10*3/uL (ref 4.0–10.5)
nRBC: 0 % (ref 0.0–0.2)

## 2021-08-07 LAB — ETHANOL: Alcohol, Ethyl (B): <10 mg/dL (ref <10)

## 2021-08-07 LAB — SALICYLATE LEVEL: Salicylate Lvl: <7 mg/dL — ABNORMAL LOW (ref 7.0–30.0)

## 2021-08-07 MED ORDER — ARIPIPRAZOLE 2 MG PO TABS
3.0000 mg | ORAL_TABLET | Freq: Every day | ORAL | Status: DC
  Administered 2021-08-07: 3 mg via ORAL
  Filled 2021-08-07: qty 2

## 2021-08-07 MED ORDER — BENZTROPINE MESYLATE 1 MG PO TABS
0.5000 mg | ORAL_TABLET | Freq: Every evening | ORAL | Status: DC | PRN
  Administered 2021-08-07: 0.5 mg via ORAL
  Filled 2021-08-07: qty 1

## 2021-08-07 NOTE — ED Notes (Signed)
Pt given toothbrush and toothpaste per request 

## 2021-08-07 NOTE — ED Provider Notes (Signed)
[facility] Provider Note    Event Date/Time   First MD Initiated Contact with Patient 08/07/21 1544     (approximate)   History   Psychiatric Evaluation   HPI  Adriana Vasquez is a 76 y.o. female with history of bipolar disorder presents to the ER under IVC via [employer] [facility] due to very paranoid behavior reported suicidal ideation reported to mental health liaison with [employer] today.  Patient not providing me much additional history.  States that the "bright lights are terrible.  ".  Per IVC paperwork there is some concern the patient has been noncompliant with her medications.  She denies any pain.     Physical Exam   Triage Vital Signs: ED Triage Vitals  Enc Vitals Group     BP 08/07/21 1440 106/73     Pulse Rate 08/07/21 1440 60     Resp 08/07/21 1440 18     Temp 08/07/21 1440 98.7 F (37.1 C)     Temp Source 08/07/21 1440 Oral     SpO2 08/07/21 1440 100 %     Weight --      Height 08/07/21 1438 5' (1.524 m)     Head Circumference --      Peak Flow --      Pain Score 08/07/21 1438 0     Pain Loc --      Pain Edu? --      Excl. in GC? --     Most recent vital signs: Vitals:   08/07/21 1440  BP: 106/73  Pulse: 60  Resp: 18  Temp: 98.7 F (37.1 C)  SpO2: 100%     Constitutional: Alert  Eyes: Conjunctivae are normal.  Head: Atraumatic. Nose: No congestion/rhinnorhea. Mouth/Throat: Mucous membranes are moist.   Neck: Painless ROM.  Cardiovascular:   Good peripheral circulation. Respiratory: Normal respiratory effort.  No retractions.  Gastrointestinal: Soft and nontender.  Musculoskeletal:  no deformity Neurologic:  MAE spontaneously. No gross focal neurologic deficits are appreciated.  Skin:  Skin is warm, dry and intact. No rash noted. Psychiatric: withdrawn, odd    ED Results / Procedures / Treatments   Labs (all labs ordered are listed, but only abnormal results are displayed) Labs Reviewed   COMPREHENSIVE METABOLIC PANEL - Abnormal; Notable for the following components:      Result Value   Glucose, Bld 109 (*)    Calcium 8.8 (*)    All other components within normal limits  ETHANOL  CBC  SALICYLATE LEVEL  ACETAMINOPHEN LEVEL  URINE DRUG SCREEN, QUALITATIVE ([facility] ONLY)     EKG     RADIOLOGY    PROCEDURES:  Critical Care performed:   Procedures   MEDICATIONS ORDERED IN ED: Medications - No data to display   IMPRESSION / MDM / ASSESSMENT AND PLAN / ED COURSE  I reviewed the triage vital signs and the nursing notes.                              Differential diagnosis includes, but is not limited to, Psychosis, delirium, medication effect, noncompliance, polysubstance abuse, Si, Hi, depression  Patient presents for evaluation of paranoia and SI.  Patient has psych history of bipolar disorder.  Laboratory testing was ordered to evaluation for underlying electrolyte derangement or signs of underlying organic pathology to explain today's presentation.  Based on history and physical and laboratory evaluation, it appears that the patient's presentation  is 2/2 underlying psychiatric disorder and will require further evaluation and management by inpatient psychiatry.  Patient was made an IVC due to SI and high risk behavior in setting of acute paranoia..  Disposition pending psychiatric evaluation.  The patient has been placed in psychiatric observation due to the need to provide a safe environment for the patient while obtaining psychiatric consultation and evaluation, as well as ongoing medical and medication management to treat the patient's condition.  The patient has been placed under full IVC at this time.       FINAL CLINICAL IMPRESSION(S) / ED DIAGNOSES   Final diagnoses:  Suicidal ideation  Paranoia (HCC)     Rx / DC Orders   ED Discharge Orders     None        Note:  This document was prepared using Dragon voice recognition software and  may include unintentional dictation errors.    Willy Eddy, MD 08/07/21 [phone]

## 2021-08-07 NOTE — ED Notes (Signed)
Snack and drink given. Pt consumed 100% 
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
## 2021-08-07 NOTE — ED Notes (Signed)
INVOLUNTARY with all papers on chart/awaiting TTS/PSYCH consult ?

## 2021-08-07 NOTE — ED Notes (Signed)
Report received from Andrea B, RN including SBAR. On initial round after report Pt is warm/dry, resting quietly in room without any s/s of distress.  Will continue to monitor throughout shift as ordered for any changes in behaviors and for continued safety.   

## 2021-08-07 NOTE — ED Notes (Addendum)
Patient dressed out at this time with [facility] EDT. Belongings include: Blue purse Pink capris Pink striped tshirt Purple panties Nude bra Black flip flops Grey carry on bag
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
## 2021-08-08 ENCOUNTER — Other Ambulatory Visit: Payer: Self-pay | Admitting: Psychiatry

## 2021-08-08 DIAGNOSIS — F22 Delusional disorders: Secondary | ICD-10-CM | POA: Diagnosis not present

## 2021-08-08 DIAGNOSIS — F3176 Bipolar disorder, in full remission, most recent episode depressed: Secondary | ICD-10-CM

## 2021-08-08 MED ORDER — TRAZODONE HCL 50 MG PO TABS
50.0000 mg | ORAL_TABLET | Freq: Every day | ORAL | Status: DC
  Administered 2021-08-08: 50 mg via ORAL
  Filled 2021-08-08: qty 1

## 2021-08-08 MED ORDER — TRAZODONE HCL 50 MG PO TABS: 50.0000 mg | ORAL_TABLET | Freq: Every day | ORAL | Status: DC

## 2021-08-08 NOTE — ED Notes (Signed)
Patient requested her glasses. Myself and Zollie Scale, RN, retrieved glasses from patients belongings. Patient is now sitting on the bed writing. No other needs at this time.

## 2021-08-08 NOTE — ED Notes (Signed)
EMTALA reviewed by charge RN 

## 2021-08-08 NOTE — ED Notes (Signed)
Called c com for [facility] to transport to [facility] [phone]

## 2021-08-08 NOTE — BH Assessment (Signed)
PATIENT BED AVAILABLE ANY TIME TODAY 08/08/21  Patient has been accepted to [facility]   Patient assigned to room 412 Bed 1 Accepting physician is Dr. Merilyn Baba.  Call report to [phone].  Representative was Earlene.   ER Staff is aware of it:  [facility] ER Secretary  Dr. Derrill Kay, ER MD  Marcelle Smiling Patient's Nurse

## 2021-08-08 NOTE — ED Notes (Signed)
Breakfast given.  

## 2021-08-08 NOTE — ED Notes (Signed)
Patient is awake, sitting on the edge of the bed, writing with crayons.

## 2021-08-08 NOTE — ED Notes (Signed)
Patient ambulated to the bathroom.

## 2021-08-08 NOTE — ED Notes (Signed)
IVC/Consult Completed/Pending Placement

## 2021-08-08 NOTE — Consult Note (Signed)
[facility] Face-to-Face Psychiatry Consult   Reason for Consult: Psychiatric Evaluation Referring Physician: Dr. Roxan Hockey Patient Identification: Adriana Vasquez MRN:  [id number] Principal Diagnosis: <principal problem not specified> Diagnosis:  Active Problems:   Bipolar 1 disorder (HCC)   Anxiety state   Anxiety disorder   At risk for long QT syndrome   Bipolar disorder, current episode mixed, mild (HCC)   Bereavement   Bipolar 1 disorder, mixed, severe (HCC)   Total Time spent with patient: 1 hour  Subjective: "The power have control over me." Adriana Vasquez is a 76 y.o. female patient presented to [facility] ED via law enforcement under involuntary commitment status (IVC). This provider saw The patient face-to-face; the chart was reviewed, and consulted with Dr. Roxan Hockey on 08/07/2021 due to the patient's care. It was discussed with the EDP that the patient does meet the criteria to be admitted to the geriatric psychiatric inpatient unit.  On evaluation, the patient is alert and oriented x 3, calm, cooperative, and mood-congruent with affect. The patient does not appear to be responding to internal or external stimuli. The patient is presenting with some delusional thinking. The patient denies auditory or visual hallucinations. The patient denies any suicidal, homicidal, or self-harm ideations. The patient is not presenting with any psychotic or paranoid behaviors. During an encounter with the patient, she could answer questions appropriately.   HPI: Per Dr. Roxan Hockey, Adriana Vasquez is a 76 y.o. female with history of bipolar disorder presents to the ER under IVC via [facility] [employer] due to very paranoid behavior reported suicidal ideation reported to mental health liaison with [employer] today.  Patient not providing me much additional history.  States that the "bright lights are terrible.  ".  Per IVC paperwork there is some concern the patient has been noncompliant with her medications.  She  denies any pain.  Past Psychiatric History:  Bipolar disorder (HCC) Personal history of tobacco use, presenting hazards to health  Risk to Self:   Risk to Others:   Prior Inpatient Therapy:   Prior Outpatient Therapy:    Past Medical History:  Past Medical History:  Diagnosis Date   Bipolar disorder (HCC)    Personal history of tobacco use, presenting hazards to health 01/22/2015   UTI (urinary tract infection) 07/09/2021    Past Surgical History:  Procedure Laterality Date   ABDOMINAL HYSTERECTOMY     HAND SURGERY Right 2001   Patient not sure of what exactly was done.  Repared index finger knuckle area.   Family History:  Family History  Problem Relation Age of Onset   Arthritis Mother    Heart attack Father    Diabetes Father    Bipolar disorder Father    Breast cancer Neg Hx    Family Psychiatric  History:  Social History:  Social History   Substance and Sexual Activity  Alcohol Use No   Alcohol/week: 0.0 standard drinks of alcohol     Social History   Substance and Sexual Activity  Drug Use No    Social History   Socioeconomic History   Marital status: Widowed    Spouse name: Not on file   Number of children: Not on file   Years of education: Not on file   Highest education level: Not on file  Occupational History   Not on file  Tobacco Use   Smoking status: Former    Packs/day: 1.00    Years: 35.00    Total pack years: 35.00  Types: Cigarettes    Start date: 09/27/1979   Smokeless tobacco: Never   Tobacco comments:    03/26/2018  Vaping Use   Vaping Use: Never used  Substance and Sexual Activity   Alcohol use: No    Alcohol/week: 0.0 standard drinks of alcohol   Drug use: No   Sexual activity: Not Currently  Other Topics Concern   Not on file  Social History Narrative   Not on file   Social Determinants of Health   Financial Resource Strain: Low Risk  (03/15/2017)   Overall Financial Resource Strain (CARDIA)    Difficulty of Paying  Living Expenses: Not hard at all  Food Insecurity: No Food Insecurity (03/15/2017)   Hunger Vital Sign    Worried About Running Out of Food in the Last Year: Never true    Ran Out of Food in the Last Year: Never true  Transportation Needs: No Transportation Needs (03/15/2017)   PRAPARE - Administrator, Civil Service (Medical): No    Lack of Transportation (Non-Medical): No  Physical Activity: Not on file  Stress: Not on file  Social Connections: Not on file   Additional Social History:    Allergies:  No Known Allergies  Labs:  Results for orders placed or performed during the hospital encounter of 08/07/21 (from the past 48 hour(s))  Comprehensive metabolic panel     Status: Abnormal   Collection Time: 08/07/21  2:41 PM  Result Value Ref Range   Sodium 137 135 - 145 mmol/L   Potassium 3.5 3.5 - 5.1 mmol/L   Chloride 104 98 - 111 mmol/L   CO2 26 22 - 32 mmol/L   Glucose, Bld 109 (H) 70 - 99 mg/dL    Comment: Glucose reference range applies only to samples taken after fasting for at least 8 hours.   BUN 9 8 - 23 mg/dL   Creatinine, Ser 1.79 0.44 - 1.00 mg/dL   Calcium 8.8 (L) 8.9 - 10.3 mg/dL   Total Protein 7.1 6.5 - 8.1 g/dL   Albumin 3.7 3.5 - 5.0 g/dL   AST 29 15 - 41 U/L   ALT 24 0 - 44 U/L   Alkaline Phosphatase 63 38 - 126 U/L   Total Bilirubin 0.6 0.3 - 1.2 mg/dL   GFR, Estimated >15 >05 mL/min    Comment: (NOTE) Calculated using the CKD-EPI Creatinine Equation (2021)    Anion gap 7 5 - 15    Comment: Performed at [facility], [address] [place], [place] [address]  Ethanol     Status: None   Collection Time: 08/07/21  2:41 PM  Result Value Ref Range   Alcohol, Ethyl (B) <10 <10 mg/dL    Comment: (NOTE) Lowest detectable limit for serum alcohol is 10 mg/dL.  For medical purposes only. Performed at [facility], [address] [place], [place] [address]   Salicylate level     Status: Abnormal   Collection Time: 08/07/21   2:41 PM  Result Value Ref Range   Salicylate Lvl <7.0 (L) 7.0 - 30.0 mg/dL    Comment: Performed at [facility], [address] [place], [place] [address]  Acetaminophen level     Status: Abnormal   Collection Time: 08/07/21  2:41 PM  Result Value Ref Range   Acetaminophen (Tylenol), Serum <10 (L) 10 - 30 ug/mL    Comment: (NOTE) Therapeutic concentrations vary significantly. A range of 10-30 ug/mL  may be an effective concentration for  many patients. However, some  are best treated at concentrations outside of this range. Acetaminophen concentrations >150 ug/mL at 4 hours after ingestion  and >50 ug/mL at 12 hours after ingestion are often associated with  toxic reactions.  Performed at [facility], [address] [place], [place] [address]   cbc     Status: None   Collection Time: 08/07/21  2:41 PM  Result Value Ref Range   WBC 7.3 4.0 - 10.5 K/uL   RBC 4.12 3.87 - 5.11 MIL/uL   Hemoglobin 12.5 12.0 - 15.0 g/dL   HCT 03.4 74.2 - 59.5 %   MCV 93.2 80.0 - 100.0 fL   MCH 30.3 26.0 - 34.0 pg   MCHC 32.6 30.0 - 36.0 g/dL   RDW 63.8 75.6 - 43.3 %   Platelets 228 150 - 400 K/uL   nRBC 0.0 0.0 - 0.2 %    Comment: Performed at [facility], [address], [place], [place] [address]  Urine Drug Screen, Qualitative     Status: None   Collection Time: 08/07/21  3:52 PM  Result Value Ref Range   Tricyclic, Ur Screen NONE DETECTED NONE DETECTED   Amphetamines, Ur Screen NONE DETECTED NONE DETECTED   MDMA (Ecstasy)Ur Screen NONE DETECTED NONE DETECTED   Cocaine Metabolite,Ur [place] NONE DETECTED NONE DETECTED   Opiate, Ur Screen NONE DETECTED NONE DETECTED   Phencyclidine (PCP) Ur S NONE DETECTED NONE DETECTED   Cannabinoid 50 Ng, Ur [place] NONE DETECTED NONE DETECTED   Barbiturates, Ur Screen NONE DETECTED NONE DETECTED   Benzodiazepine, Ur Scrn NONE DETECTED NONE DETECTED   Methadone Scn, Ur NONE DETECTED NONE DETECTED    Comment: (NOTE) Tricyclics +  metabolites, urine    Cutoff 1000 ng/mL Amphetamines + metabolites, urine  Cutoff 1000 ng/mL MDMA (Ecstasy), urine              Cutoff 500 ng/mL Cocaine Metabolite, urine          Cutoff 300 ng/mL Opiate + metabolites, urine        Cutoff 300 ng/mL Phencyclidine (PCP), urine         Cutoff 25 ng/mL Cannabinoid, urine                 Cutoff 50 ng/mL Barbiturates + metabolites, urine  Cutoff 200 ng/mL Benzodiazepine, urine              Cutoff 200 ng/mL Methadone, urine                   Cutoff 300 ng/mL  The urine drug screen provides only a preliminary, unconfirmed analytical test result and should not be used for non-medical purposes. Clinical consideration and professional judgment should be applied to any positive drug screen result due to possible interfering substances. A more specific alternate chemical method must be used in order to obtain a confirmed analytical result. Gas chromatography / mass spectrometry (GC/MS) is the preferred confirm atory method. Performed at [facility], [address] [place], [place] [address]   Urinalysis, Routine w reflex microscopic     Status: Abnormal   Collection Time: 08/07/21  3:52 PM  Result Value Ref Range   Color, Urine STRAW (A) YELLOW   APPearance CLEAR (A) CLEAR   Specific Gravity, Urine 1.003 (L) 1.005 - 1.030   pH 7.0 5.0 - 8.0   Glucose, UA NEGATIVE NEGATIVE mg/dL   Hgb urine dipstick NEGATIVE NEGATIVE   Bilirubin Urine NEGATIVE NEGATIVE  Ketones, ur NEGATIVE NEGATIVE mg/dL   Protein, ur NEGATIVE NEGATIVE mg/dL   Nitrite NEGATIVE NEGATIVE   Leukocytes,Ua TRACE (A) NEGATIVE   RBC / HPF 0-5 0 - 5 RBC/hpf   WBC, UA 0-5 0 - 5 WBC/hpf   Bacteria, UA NONE SEEN NONE SEEN   Squamous Epithelial / LPF 0-5 0 - 5    Comment: Performed at [facility], [address] [place], [place] [address]  Resp Panel by RT-PCR (Flu A&B, Covid) Anterior Nasal Swab     Status: None   Collection Time: 08/07/21  4:14 PM    Specimen: Anterior Nasal Swab  Result Value Ref Range   SARS Coronavirus 2 by RT PCR NEGATIVE NEGATIVE    Comment: (NOTE) SARS-CoV-2 target nucleic acids are NOT DETECTED.  The SARS-CoV-2 RNA is generally detectable in upper respiratory specimens during the acute phase of infection. The lowest concentration of SARS-CoV-2 viral copies this assay can detect is 138 copies/mL. A negative result does not preclude SARS-Cov-2 infection and should not be used as the sole basis for treatment or other patient management decisions. A negative result may occur with  improper specimen collection/handling, submission of specimen other than nasopharyngeal swab, presence of viral mutation(s) within the areas targeted by this assay, and inadequate number of viral copies(<138 copies/mL). A negative result must be combined with clinical observations, patient history, and epidemiological information. The expected result is Negative.  Fact Sheet for Patients:  BloggerCourse.comhttps://www.fda.gov/media/152166/download  Fact Sheet for Healthcare Providers:  SeriousBroker.ithttps://www.fda.gov/media/152162/download  This test is no t yet approved or cleared by the [place] FDA and  has been authorized for detection and/or diagnosis of SARS-CoV-2 by FDA under an Emergency Use Authorization (EUA). This EUA will remain  in effect (meaning this test can be used) for the duration of the COVID-19 declaration under Section 564(b)(1) of the Act, 21 U.S.C.section 360bbb-3(b)(1), unless the authorization is terminated  or revoked sooner.       Influenza A by PCR NEGATIVE NEGATIVE   Influenza B by PCR NEGATIVE NEGATIVE    Comment: (NOTE) The Xpert Xpress SARS-CoV-2/FLU/RSV plus assay is intended as an aid in the diagnosis of influenza from Nasopharyngeal swab specimens and should not be used as a sole basis for treatment. Nasal washings and aspirates are unacceptable for Xpert Xpress SARS-CoV-2/FLU/RSV testing.  Fact Sheet for  Patients: BloggerCourse.comhttps://www.fda.gov/media/152166/download  Fact Sheet for Healthcare Providers: SeriousBroker.ithttps://www.fda.gov/media/152162/download  This test is not yet approved or cleared by the [place] FDA and has been authorized for detection and/or diagnosis of SARS-CoV-2 by FDA under an Emergency Use Authorization (EUA). This EUA will remain in effect (meaning this test can be used) for the duration of the COVID-19 declaration under Section 564(b)(1) of the Act, 21 U.S.C. section 360bbb-3(b)(1), unless the authorization is terminated or revoked.  Performed at [facility], [address] [place], [place] [address]     Current Facility-Administered Medications  Medication Dose Route Frequency Provider Last Rate Last Admin   ARIPiprazole (ABILIFY) tablet 3 mg  3 mg Oral QHS Willy Eddyobinson, Patrick, MD   3 mg at 08/07/21 2114   benztropine (COGENTIN) tablet 0.5 mg  0.5 mg Oral QHS PRN Willy Eddyobinson, Patrick, MD   0.5 mg at 08/07/21 2114   Current Outpatient Medications  Medication Sig Dispense Refill   ARIPiprazole (ABILIFY) 2 MG tablet Take 1.5 tablets (3 mg total) by mouth at bedtime. (Patient not taking: Reported on 08/07/2021) 45 tablet 1   benztropine (COGENTIN) 0.5 MG tablet TAKE 1  TABLET BY MOUTH AT BEDTIME AS NEEDED FOR TREMORS, RESTLESSNESS, OR ABNORMAL MOVEMENTS (Patient not taking: Reported on 08/07/2021) 90 tablet 0   busPIRone (BUSPAR) 10 MG tablet Take 1 tablet (10 mg total) by mouth 3 (three) times daily. (Patient not taking: Reported on 08/07/2021) 270 tablet 2   Calcium Carbonate-Vitamin D 600-400 MG-UNIT per tablet Take by mouth. (Patient not taking: Reported on 08/07/2021)     gabapentin (NEURONTIN) 300 MG capsule Take 1 capsule by mouth 3 (three) times daily. (Patient not taking: Reported on 08/07/2021)     hydrOXYzine (ATARAX) 10 MG tablet Take 1-2 tablets (10-20 mg total) by mouth daily as needed. For severe anxiety, agitation (Patient not taking: Reported on 08/07/2021) 15  tablet 1   traZODone (DESYREL) 50 MG tablet Take 0.5-1 tablets (25-50 mg total) by mouth at bedtime as needed for sleep. (Patient not taking: Reported on 08/07/2021) 45 tablet 1    Musculoskeletal: Strength & Muscle Tone: within normal limits Gait & Station: normal Patient leans: N/A  Psychiatric Specialty Exam:  Presentation  General Appearance: Appropriate for Environment  Eye Contact:Good  Speech:Clear and Coherent  Speech Volume:Normal  Handedness:Right   Mood and Affect  Mood:Anxious  Affect:Congruent   Thought Process  Thought Processes:Disorganized  Descriptions of Associations:Loose  Orientation:Full (Time, Place and Person)  Thought Content:Illogical  History of Schizophrenia/Schizoaffective disorder:No  Duration of Psychotic Symptoms:No data recorded Hallucinations:Hallucinations: None  Ideas of Reference:None  Suicidal Thoughts:Suicidal Thoughts: No  Homicidal Thoughts:Homicidal Thoughts: No   Sensorium  Memory:Immediate Good; Recent Good; Remote Good  Judgment:Poor  Insight:Poor   Executive Functions  Concentration:Fair  Attention Span:Fair  Recall:Poor  Fund of Knowledge:Poor  Language:Fair   Psychomotor Activity  Psychomotor Activity:Psychomotor Activity: Normal   Assets  Assets:Communication Skills; Desire for Improvement; Social Support; Resilience   Sleep  Sleep:Sleep: Poor Number of Hours of Sleep: 3   Physical Exam: Physical Exam Vitals and nursing note reviewed.  Constitutional:      Appearance: Normal appearance. She is normal weight.  HENT:     Head: Normocephalic and atraumatic.     Right Ear: External ear normal. There is impacted cerumen.     Left Ear: External ear normal. There is impacted cerumen.     Nose: Nose normal.  Cardiovascular:     Rate and Rhythm: Normal rate.     Pulses: Normal pulses.  Pulmonary:     Effort: Pulmonary effort is normal.  Musculoskeletal:        General: Normal range  of motion.     Cervical back: Normal range of motion and neck supple.  Neurological:     Mental Status: She is alert and oriented to person, place, and time. Mental status is at baseline.  Psychiatric:        Attention and Perception: Perception normal.        Mood and Affect: Mood and affect normal.        Speech: Speech normal.        Behavior: Behavior normal. Behavior is cooperative.        Thought Content: Thought content normal.        Cognition and Memory: Cognition is impaired. Memory is impaired. She exhibits impaired recent memory.        Judgment: Judgment is inappropriate.    Review of Systems  Psychiatric/Behavioral:  The patient is nervous/anxious.   All other systems reviewed and are negative.  Blood pressure 116/63, pulse 64, temperature 98.4 F (36.9 C), temperature source Oral, resp. rate  18, height 5' (1.524 m), SpO2 92 %. Body mass index is 26.76 kg/m.  Treatment Plan Summary: Medication management and Plan Patient does meet criteria for geriatric-psychiatric inpatient admission  Disposition: Recommend psychiatric Inpatient admission when medically cleared. Supportive therapy provided about ongoing stressors.  Gillermo Murdoch, NP 08/08/2021 12:19 AM

## 2021-08-08 NOTE — ED Notes (Signed)
Patient was given medication to help her sleep. Patient is now resting in bed.

## 2021-08-08 NOTE — ED Notes (Signed)
Assumed care of patient. Patient ambulated from the bathroom to room. Patient does not endorse SI/HI thoughts at this time. Patient was refreshments at bedside.

## 2021-08-08 NOTE — BH Assessment (Addendum)
Referral information for Psychiatric Hospitalization faxed to;   [facility] [phone]- [phone]),   [facility] [phone] -[phone]) [phone]  [facility] [phone]), Staff reports facility currently at capacity  [facility] [phone]),   [facility] [phone] -or- [phone]),   [facility] [phone] or [phone]), Currently under review with Tyrell Antonio

## 2021-08-21 ENCOUNTER — Telehealth: Payer: Self-pay | Admitting: Psychiatry

## 2021-08-21 NOTE — Telephone Encounter (Signed)
I have reviewed medical records received from [facility]-08/08/2021 - 08/20/2021.  Patient admitted for bipolar disorder.  Patient was discharged on Abilify 5 mg at bedtime, BuSpar 10 mg twice a day, gabapentin 100 mg 3 times a day, trazodone 50 mg at bedtime as needed for sleep.  Patient also was provided melatonin 3 mg. Benztropine 0.5 mg and hydroxyzine were stopped.  Patient has upcoming appointment with provider.  Will scan medical records into the system.

## 2021-09-04 ENCOUNTER — Ambulatory Visit (INDEPENDENT_AMBULATORY_CARE_PROVIDER_SITE_OTHER): Payer: Medicare PPO | Admitting: Psychiatry

## 2021-09-04 ENCOUNTER — Encounter: Payer: Self-pay | Admitting: Psychiatry

## 2021-09-04 VITALS — BP 95/60 | HR 76 | Temp 97.6°F | Wt 137.2 lb

## 2021-09-04 DIAGNOSIS — F5105 Insomnia due to other mental disorder: Secondary | ICD-10-CM | POA: Diagnosis not present

## 2021-09-04 DIAGNOSIS — F3176 Bipolar disorder, in full remission, most recent episode depressed: Secondary | ICD-10-CM

## 2021-09-04 DIAGNOSIS — G3184 Mild cognitive impairment, so stated: Secondary | ICD-10-CM

## 2021-09-04 MED ORDER — BUSPIRONE HCL 10 MG PO TABS: 10.0000 mg | ORAL_TABLET | Freq: Two times a day (BID) | ORAL | 0 refills | Status: DC

## 2021-09-04 MED ORDER — ARIPIPRAZOLE 5 MG PO TABS: 5.0000 mg | ORAL_TABLET | Freq: Every day | ORAL | 0 refills | Status: DC

## 2021-09-04 MED ORDER — GABAPENTIN 100 MG PO CAPS: 100.0000 mg | ORAL_CAPSULE | Freq: Four times a day (QID) | ORAL | 1 refills | Status: DC

## 2021-09-04 MED ORDER — TRAZODONE HCL 50 MG PO TABS: 25.0000 mg | ORAL_TABLET | Freq: Every evening | ORAL | 0 refills | Status: DC | PRN

## 2021-09-04 NOTE — Progress Notes (Signed)
BH MD OP Progress Note  09/04/2021 1:03 PM Adriana Vasquez  MRN:  [id number]  Chief Complaint:  Chief Complaint  Patient presents with   Follow-up: 76 year old Caucasian female with history of bipolar disorder, recurrent UTI, paranoia, sleep problem, recent inpatient behavioral health admission, presented for medication management.   HPI: Adriana Vasquez is a 76 year old Caucasian female, lives in [place], retired, widowed, has a history of bipolar disorder, insomnia was evaluated in office today.  Patient's brother Adriana Vasquez participated in the evaluation.  Patient s/p most recent admission to [facility]-08/08/2021 - 08/20/2021-reviewed discharge summary-per Dr.Subedi.  Patient was discharged on Abilify 5 mg daily, melatonin 3 mg, gabapentin 100 mg 3 times a day and trazodone 50 mg at bedtime.  Patient was advised to stop benztropine and hydroxyzine.  Patient today appeared to be pleasant, cooperative, alert and oriented to person place time situation.  Patient although delusional, does not seem to be too preoccupied with it today.  Patient reports she does not feel depressed.  She has been sleeping better.  Patient does report she continues to have pain of lower extremities and would like to readjust her gabapentin to help with the the same since she is anxious about her pain.  Patient denies any side effects to medications.  Patient with low blood pressure, chronic, likely her baseline and currently asymptomatic.  However patient as well as brother agrees to discuss with primary care provider and possibly cardiology consultation as needed.  Patient with possible memory issues does have upcoming neurology evaluation.   As per brother Adriana Vasquez, patient continues to have certain paranoid/delusional thoughts although she does not seem to be too preoccupied with it.  She seems to be doing better on the current medication regimen.  Denies any other concerns  today.  Visit Diagnosis:    ICD-10-CM   1. Bipolar disorder, in full remission, most recent episode depressed (HCC)  F31.76 gabapentin (NEURONTIN) 100 MG capsule    ARIPiprazole (ABILIFY) 5 MG tablet    2. Insomnia due to mental disorder  F51.05 traZODone (DESYREL) 50 MG tablet    busPIRone (BUSPAR) 10 MG tablet   mood    3. Mild neurocognitive disorder  G31.84       Past Psychiatric History: Reviewed past psychiatric history from progress note on 03/22/2019.  Past trials of Geodon, Abilify, trazodone, hydroxyzine, BuSpar.  Past Medical History:  Past Medical History:  Diagnosis Date   Bipolar disorder [facility])    Personal history of tobacco use, presenting hazards to health 01/22/2015   UTI (urinary tract infection) 07/09/2021    Past Surgical History:  Procedure Laterality Date   ABDOMINAL HYSTERECTOMY     HAND SURGERY Right 2001   Patient not sure of what exactly was done.  Repared index finger knuckle area.    Family Psychiatric History: Reviewed family psychiatric history from progress note on 03/22/2019.  Family History:  Family History  Problem Relation Age of Onset   Arthritis Mother    Heart attack Father    Diabetes Father    Bipolar disorder Father    Breast cancer Neg Hx     Social History: Reviewed social history from progress note on 03/22/2019. Social History   Socioeconomic History   Marital status: Widowed    Spouse name: Not on file   Number of children: Not on file   Years of education: Not on file   Highest education level: Not on file  Occupational History   Not on file  Tobacco Use   Smoking status: Former    Packs/day: 1.00    Years: 35.00    Total pack years: 35.00    Types: Cigarettes    Start date: 09/27/1979   Smokeless tobacco: Never   Tobacco comments:    03/26/2018  Vaping Use   Vaping Use: Never used  Substance and Sexual Activity   Alcohol use: No    Alcohol/week: 0.0 standard drinks of alcohol   Drug use: No   Sexual  activity: Not Currently  Other Topics Concern   Not on file  Social History Narrative   Not on file   Social Determinants of Health   Financial Resource Strain: Low Risk  (03/15/2017)   Overall Financial Resource Strain (CARDIA)    Difficulty of Paying Living Expenses: Not hard at all  Food Insecurity: No Food Insecurity (03/15/2017)   Hunger Vital Sign    Worried About Running Out of Food in the Last Year: Never true    Ran Out of Food in the Last Year: Never true  Transportation Needs: No Transportation Needs (03/15/2017)   PRAPARE - Administrator, Civil Service (Medical): No    Lack of Transportation (Non-Medical): No  Physical Activity: Not on file  Stress: Not on file  Social Connections: Not on file    Allergies: No Known Allergies  Metabolic Disorder Labs: Lab Results  Component Value Date   HGBA1C 5.7 (H) 04/10/2020   MPG 117 04/10/2020   Lab Results  Component Value Date   PROLACTIN 20.3 04/10/2020   PROLACTIN 26.7 (H) 02/07/2015   Lab Results  Component Value Date   CHOL 218 (H) 12/07/2014   TRIG 153 (H) 12/07/2014   HDL 60 12/07/2014   LDLCALC 127 (H) 12/07/2014   No results found for: "TSH"  Therapeutic Level Labs: No results found for: "LITHIUM" No results found for: "VALPROATE" No results found for: "CBMZ"  Current Medications: Current Outpatient Medications  Medication Sig Dispense Refill   Calcium Carbonate-Vitamin D 600-400 MG-UNIT per tablet Take by mouth.     Lactobacillus Acid-Pectin (ACIDOPHILUS/PECTIN) CAPS Take by mouth.     melatonin 3 MG TABS tablet Take by mouth.     pantoprazole (PROTONIX) 40 MG tablet Take by mouth.     ARIPiprazole (ABILIFY) 5 MG tablet Take 1 tablet (5 mg total) by mouth daily. 90 tablet 0   busPIRone (BUSPAR) 10 MG tablet Take 1 tablet (10 mg total) by mouth 2 (two) times daily. 180 tablet 0   gabapentin (NEURONTIN) 100 MG capsule Take 1 capsule (100 mg total) by mouth 4 (four) times daily. 120  capsule 1   traZODone (DESYREL) 50 MG tablet Take 0.5-1 tablets (25-50 mg total) by mouth at bedtime as needed for sleep. 90 tablet 0   No current facility-administered medications for this visit.     Musculoskeletal: Strength & Muscle Tone: within normal limits Gait & Station: normal Patient leans: N/A  Psychiatric Specialty Exam: Review of Systems  Musculoskeletal:  Positive for myalgias.  Psychiatric/Behavioral:  The patient is nervous/anxious.   All other systems reviewed and are negative.   Blood pressure 95/60, pulse 76, temperature 97.6 F (36.4 C), temperature source Temporal, weight 137 lb 3.2 oz (62.2 kg).Body mass index is 26.8 kg/m.  General Appearance: Casual  Eye Contact:  Fair  Speech:  Clear and Coherent  Volume:  Normal  Mood:  Anxious improving  Affect:  Full Range  Thought Process:  Goal Directed and Descriptions of Associations:  Intact  Orientation:  Full (Time, Place, and Person)  Thought Content: Delusions , chronic-does not seem to be too preoccupied with it.  Suicidal Thoughts:  No  Homicidal Thoughts:  No  Memory:  Immediate;   Fair Recent;   Fair Remote;   Fair does report short-term memory loss  Judgement:  Fair  Insight:  Fair  Psychomotor Activity:  Normal  Concentration:  Concentration: Fair and Attention Span: Fair  Recall:  [employer] of Knowledge: Fair  Language: Fair  Akathisia:  No  Handed:  Right  AIMS (if indicated): done  Assets:  Communication Skills Desire for Improvement Housing Social Support  ADL's:  Intact  Cognition: impaired unspecified  Sleep:  Fair   Screenings: AIMS    Flowsheet Row Office Visit from 09/04/2021 in [facility] Office Visit from 08/05/2021 in [facility] Office Visit from 07/29/2021 in [facility] Office Visit from 07/16/2021 in [facility] Office Visit from 07/09/2021 in [facility]  AIMS Total Score 0 0 0 0 0      GAD-7    Flowsheet Row Office Visit from 08/13/2020 in [facility]  Total GAD-7 Score 2      PHQ2-9    Flowsheet Row Office Visit from 09/04/2021 in [facility] Office Visit from 07/29/2021 in [facility] Office Visit from 07/09/2021 in [facility] Office Visit from 05/05/2021 in [facility] Video Visit from 03/06/2021 in [facility]  PHQ-2 Total Score 2 3 2 1  0  PHQ-9 Total Score 6 15 11 3  --      Flowsheet Row Office Visit from 09/04/2021 in [facility] ED from 08/07/2021 in [facility] Office Visit from 07/29/2021 in [facility]  C-SSRS RISK CATEGORY No Risk No Risk No Risk        Assessment and Plan: CHAKA BOYSON is a 76 year old Caucasian female, widowed, retired, lives in [place], currently improving after recent inpatient behavioral health admission, will benefit from the following plan.  Plan  Bipolar disorder in remission Continue Abilify 5 mg p.o. daily. BuSpar 10 mg p.o. twice daily-reduced dosage per review of discharge summary. Increase gabapentin to 100 mg p.o. 4 times daily for anxiety as well as lower extremities pain  Insomnia-improving Trazodone 50 mg p.o. nightly as needed Continue melatonin 3 mg p.o. nightly  Mild neurocognitive disorder-unstable [place]-on 08/05/2021-22 out of 30 Referred for neurology consultation-pending  I have reviewed notes per Dr. Derby recent inpatient behavioral health admission at [facility]-dated 08/08/2021 - 08/20/2021.  I have obtained collateral information from [name]-brother who was in session today.  Patient with chronic-low blood pressure-advised to keep a log and take it to her  primary care provider.  Follow-up in clinic in 2 months or sooner if needed.  This note was generated in part or whole with voice recognition software. Voice recognition is usually quite accurate but there are transcription errors that can and very often do occur. I apologize for any typographical errors that were not detected and corrected.      08/10/2021, MD 09/05/2021, 8:30 AM

## 2021-09-04 NOTE — Patient Instructions (Signed)
Blood Pressure Record Sheet To take your blood pressure, you will need a blood pressure machine. You may be prescribed one, or you can buy a blood pressure machine (blood pressure monitor) at your clinic, drug store, or online. When choosing one, look for these features: An automatic monitor that has an arm cuff. A cuff that wraps snugly, but not too tightly, around your upper arm. You should be able to fit only one finger between your arm and the cuff. A device that stores blood pressure reading results. Do not choose a monitor that measures your blood pressure from your wrist or finger. Follow your health care provider's instructions for how to take your blood pressure. To use this form: Get one reading in the morning (a.m.) before you take any medicines. Get one reading in the evening (p.m.) before supper. Take at least two readings with each blood pressure check. This makes sure the results are correct. Wait 1-2 minutes between measurements. Write down the results in the spaces on this form. Repeat this once a week, or as told by your health care provider. Make a follow-up appointment with your health care provider to discuss the results. Blood pressure log Date: _______________________ a.m. _____________________(1st reading) _____________________(2nd reading) p.m. _____________________(1st reading) _____________________(2nd reading) Date: _______________________ a.m. _____________________(1st reading) _____________________(2nd reading) p.m. _____________________(1st reading) _____________________(2nd reading) Date: _______________________ a.m. _____________________(1st reading) _____________________(2nd reading) p.m. _____________________(1st reading) _____________________(2nd reading) Date: _______________________ a.m. _____________________(1st reading) _____________________(2nd reading) p.m. _____________________(1st reading) _____________________(2nd reading) Date:  _______________________ a.m. _____________________(1st reading) _____________________(2nd reading) p.m. _____________________(1st reading) _____________________(2nd reading) This information is not intended to replace advice given to you by your health care provider. Make sure you discuss any questions you have with your health care provider. Document Revised: 10/24/2020 Document Reviewed: 10/24/2020 Elsevier Patient Education  2023 Elsevier Inc.  

## 2021-09-08 ENCOUNTER — Other Ambulatory Visit: Payer: Self-pay | Admitting: Infectious Diseases

## 2021-09-08 DIAGNOSIS — M79604 Pain in right leg: Secondary | ICD-10-CM

## 2021-09-16 ENCOUNTER — Telehealth: Payer: Self-pay

## 2021-09-16 NOTE — Telephone Encounter (Signed)
Please inform her that we take care of medication which is relevant to mental health care at outpatient clinic. It seems like she has a PCP at [facility]. Please advise her to contact her provider.

## 2021-09-16 NOTE — Telephone Encounter (Signed)
I did.  But she states that the unit gave it too her when she was inpatient and that they told her that Dr. Elna Breslow would refill.

## 2021-09-16 NOTE — Telephone Encounter (Signed)
Reviewed the chart. I do not see that this medication was filled by Dr. Elna Breslow in the past. Could you advise her to contact her primary care for this refill? Thanks.

## 2021-09-16 NOTE — Telephone Encounter (Signed)
Pt states she needs a refill on the protonix. She states that they gave it too her when she was on the [facility] unit. Pt last seen Dr. Elna Breslow on 09-04-21 and next appt 11-03-21   pantoprazole (PROTONIX) 40 MG tablet Medication Date: 09/04/2021 Department: [facility] Documenting: Elvina Mattes, CMA Authorizing: [provider]   Order Providers  Prescribing Provider Encounter Provider  [provider] Jomarie Longs, MD   Outpatient Medication Detail   Disp Refills Start End   pantoprazole (PROTONIX) 40 MG tablet   08/20/2021    Sig - Route: Take by mouth. - Oral   Class: Historical Med

## 2021-09-17 NOTE — Telephone Encounter (Signed)
left message for her to contact PCP or [facility] for refills for protonix.

## 2021-09-23 ENCOUNTER — Ambulatory Visit
Admission: RE | Admit: 2021-09-23 | Discharge: 2021-09-23 | Disposition: A | Discharge status: Home / Self Care | Payer: Medicare PPO | Source: Ambulatory Visit | Attending: Infectious Diseases | Admitting: Infectious Diseases

## 2021-09-23 DIAGNOSIS — M79604 Pain in right leg: Secondary | ICD-10-CM | POA: Insufficient documentation

## 2021-09-30 ENCOUNTER — Telehealth: Payer: Self-pay

## 2021-09-30 DIAGNOSIS — F3176 Bipolar disorder, in full remission, most recent episode depressed: Secondary | ICD-10-CM

## 2021-09-30 MED ORDER — GABAPENTIN 300 MG PO CAPS: 300.0000 mg | ORAL_CAPSULE | Freq: Two times a day (BID) | ORAL | 0 refills | Status: DC

## 2021-09-30 NOTE — Telephone Encounter (Signed)
pt called states that [facility].  ask if she could take gabapentin 200mg  3x a day. Please send in new rx

## 2021-09-30 NOTE — Telephone Encounter (Signed)
Reviewed notes per neurologist-Ms.Nilda Calamity - dated 09/30/2021. Okay with increasing her gabapentin.  However since gabapentin is not available in 200 mg capsules, will increase gabapentin to 300 mg twice a day-dosage increased to 600 mg daily.

## 2021-10-01 NOTE — Telephone Encounter (Signed)
pt called back. pt was given instruction about the gabapentin 300mg  and she was told that rx was already sent to the pharmacy

## 2021-10-01 NOTE — Telephone Encounter (Signed)
left message for Mrs. Hazell to call office back.

## 2021-10-23 ENCOUNTER — Other Ambulatory Visit: Payer: Self-pay | Admitting: Infectious Diseases

## 2021-10-23 DIAGNOSIS — Z1231 Encounter for screening mammogram for malignant neoplasm of breast: Secondary | ICD-10-CM

## 2021-11-03 ENCOUNTER — Ambulatory Visit (INDEPENDENT_AMBULATORY_CARE_PROVIDER_SITE_OTHER): Payer: Medicare PPO | Admitting: Psychiatry

## 2021-11-03 ENCOUNTER — Encounter: Payer: Self-pay | Admitting: Psychiatry

## 2021-11-03 VITALS — BP 100/63 | HR 86 | Temp 97.9°F | Wt 142.8 lb

## 2021-11-03 DIAGNOSIS — F3176 Bipolar disorder, in full remission, most recent episode depressed: Secondary | ICD-10-CM | POA: Diagnosis not present

## 2021-11-03 DIAGNOSIS — F5105 Insomnia due to other mental disorder: Secondary | ICD-10-CM | POA: Diagnosis not present

## 2021-11-03 MED ORDER — GABAPENTIN 300 MG PO CAPS: 300.0000 mg | ORAL_CAPSULE | Freq: Three times a day (TID) | ORAL | 0 refills | Status: DC

## 2021-11-03 NOTE — Progress Notes (Signed)
BH MD OP Progress Note  11/03/2021 11:59 AM Adriana Vasquez  MRN:  [id number]  Chief Complaint:  Chief Complaint  Patient presents with   Follow-up   76 year old Caucasian female with history of bipolar disorder, presented for medication management.   HPI: Adriana Vasquez is a 76 year old Caucasian female, lives in [place], retired, widowed, has a history of bipolar disorder, insomnia, was evaluated in office today.  Patient as well as brother Brett Canales participated in the evaluation today.  Patient today reports overall mood symptoms are stable.  Denies any depression symptoms.  Does have mild anxiety on and off every 2 weeks or so however it does not bother her much.  She reports she continues to have paranoia that neighbors are doing things to bother her as well as sees flashes of light coming from the corridor of her apartment through her living room door.  She reports although she has these concerns it does not bother her much.  She is able to ignore it.  It does not affect her functioning, does not affect her sleep.  She has been sleeping okay.  Does have melatonin as well as trazodone available which helps.  Once a month or so she may have problems sleeping and she wakes up and doing things to relax her like getting her dishes out of her dishwasher.  Patient currently denies any suicidality, homicidality.  Patient is currently compliant on medications.  Uses the gabapentin 3 times a day, it does not make her sleepy in the afternoon anymore.  Patient denies any side effects to medications.  Denies any other concerns today.  Collateral information obtained from brother who reports patient is doing well.  Visit Diagnosis:    ICD-10-CM   1. Bipolar disorder, in full remission, most recent episode depressed (HCC)  F31.76 gabapentin (NEURONTIN) 300 MG capsule    2. Insomnia due to mental disorder  F51.05    mood      Past Psychiatric History: Reviewed past psychiatric history from  progress note on 03/22/2019.  Past trials of Geodon, Abilify, trazodone, hydroxyzine, BuSpar.  Recent inpatient admission at [facility]-08/08/2021 - 08/20/2021.  Past Medical History:  Past Medical History:  Diagnosis Date   Bipolar disorder [facility])    Personal history of tobacco use, presenting hazards to health 01/22/2015   UTI (urinary tract infection) 07/09/2021    Past Surgical History:  Procedure Laterality Date   ABDOMINAL HYSTERECTOMY     HAND SURGERY Right 2001   Patient not sure of what exactly was done.  Repared index finger knuckle area.    Family Psychiatric History: Reviewed family psychiatric history from progress note on 03/22/2019.  Family History:  Family History  Problem Relation Age of Onset   Arthritis Mother    Heart attack Father    Diabetes Father    Bipolar disorder Father    Breast cancer Neg Hx     Social History: Reviewed social history from progress note on 03/22/2019. Social History   Socioeconomic History   Marital status: Widowed    Spouse name: Not on file   Number of children: Not on file   Years of education: Not on file   Highest education level: Not on file  Occupational History   Not on file  Tobacco Use   Smoking status: Former    Packs/day: 1.00    Years: 35.00    Total pack years: 35.00    Types: Cigarettes    Start date: 09/27/1979  Smokeless tobacco: Never   Tobacco comments:    03/26/2018  Vaping Use   Vaping Use: Never used  Substance and Sexual Activity   Alcohol use: No    Alcohol/week: 0.0 standard drinks of alcohol   Drug use: No   Sexual activity: Not Currently  Other Topics Concern   Not on file  Social History Narrative   Not on file   Social Determinants of Health   Financial Resource Strain: Low Risk  (03/15/2017)   Overall Financial Resource Strain (CARDIA)    Difficulty of Paying Living Expenses: Not hard at all  Food Insecurity: No Food Insecurity (03/15/2017)   Hunger Vital Sign     Worried About Running Out of Food in the Last Year: Never true    Ran Out of Food in the Last Year: Never true  Transportation Needs: No Transportation Needs (03/15/2017)   PRAPARE - Administrator, Civil Service (Medical): No    Lack of Transportation (Non-Medical): No  Physical Activity: Not on file  Stress: Not on file  Social Connections: Not on file    Allergies: No Known Allergies  Metabolic Disorder Labs: Lab Results  Component Value Date   HGBA1C 5.7 (H) 04/10/2020   MPG 117 04/10/2020   Lab Results  Component Value Date   PROLACTIN 20.3 04/10/2020   PROLACTIN 26.7 (H) 02/07/2015   Lab Results  Component Value Date   CHOL 218 (H) 12/07/2014   TRIG 153 (H) 12/07/2014   HDL 60 12/07/2014   LDLCALC 127 (H) 12/07/2014   No results found for: "TSH"  Therapeutic Level Labs: No results found for: "LITHIUM" No results found for: "VALPROATE" No results found for: "CBMZ"  Current Medications: Current Outpatient Medications  Medication Sig Dispense Refill   ARIPiprazole (ABILIFY) 5 MG tablet Take 1 tablet (5 mg total) by mouth daily. 90 tablet 0   busPIRone (BUSPAR) 10 MG tablet Take 1 tablet (10 mg total) by mouth 2 (two) times daily. 180 tablet 0   Calcium Carbonate-Vitamin D 600-400 MG-UNIT per tablet Take by mouth.     melatonin 3 MG TABS tablet Take by mouth.     traZODone (DESYREL) 50 MG tablet Take 0.5-1 tablets (25-50 mg total) by mouth at bedtime as needed for sleep. 90 tablet 0   gabapentin (NEURONTIN) 300 MG capsule Take 1 capsule (300 mg total) by mouth 3 (three) times daily. 270 capsule 0   pantoprazole (PROTONIX) 40 MG tablet Take by mouth. (Patient not taking: Reported on 11/03/2021)     No current facility-administered medications for this visit.     Musculoskeletal: Strength & Muscle Tone:  Normal Gait & Station: normal Patient leans: N/A  Psychiatric Specialty Exam: Review of Systems  Psychiatric/Behavioral: Negative.    All  other systems reviewed and are negative.   Blood pressure 100/63, pulse 86, temperature 97.9 F (36.6 C), temperature source Temporal, weight 142 lb 12.8 oz (64.8 kg).Body mass index is 27.89 kg/m.  General Appearance: Casual  Eye Contact:  Fair  Speech:  Clear and Coherent  Volume:  Normal  Mood:  Euthymic  Affect:  Congruent  Thought Process:  Goal Directed and Descriptions of Associations: Intact  Orientation:  Full (Time, Place, and Person)  Thought Content: Logical   Suicidal Thoughts:  No  Homicidal Thoughts:  No  Memory:  Immediate;   Fair Recent;   Fair Remote;   Fair  Judgement:  Fair  Insight:  Fair  Psychomotor Activity:  Normal  Concentration:  Concentration: Fair and Attention Span: Fair  Recall:  [employer] of Knowledge: Fair  Language: Fair  Akathisia:  No  Handed:  Right  AIMS (if indicated): done  Assets:  Communication Skills Desire for Improvement Social Support Transportation  ADL's:  Intact  Cognition: WNL  Sleep:  Fair   Screenings: AIMS    Flowsheet Row Office Visit from 11/03/2021 in [facility] Office Visit from 09/04/2021 in [facility] Office Visit from 08/05/2021 in [facility] Office Visit from 07/29/2021 in [facility] Office Visit from 07/16/2021 in [facility]  AIMS Total Score 0 0 0 0 0      GAD-7    Flowsheet Row Office Visit from 11/03/2021 in [facility] Office Visit from 08/13/2020 in [facility]  Total GAD-7 Score 3 2      PHQ2-9    Flowsheet Row Office Visit from 11/03/2021 in [facility] Office Visit from 09/04/2021 in [facility] Office Visit from 07/29/2021 in [facility] Office Visit from 07/09/2021 in [facility] Office  Visit from 05/05/2021 in [facility]  PHQ-2 Total Score 0 2 3 2 1   PHQ-9 Total Score 2 6 15 11 3       Flowsheet Row Office Visit from 11/03/2021 in [facility] Office Visit from 09/04/2021 in [facility] ED from 08/07/2021 in [facility]  C-SSRS RISK CATEGORY No Risk No Risk No Risk        Assessment and Plan: KAYLEY ZEIDERS is a 76 year old Caucasian female, widowed, retired, lives in [place], currently stable on medication, continue following plan.  Plan Bipolar disorder in remission Abilify 5 mg p.o. daily BuSpar 10 mg p.o. twice daily-reduced dosage. Gabapentin 300 mg 3 times a day.  Insomnia-stable Trazodone 50 mg p.o. nightly as needed Melatonin 3 mg p.o. nightly  Reviewed notes per Ms.65 -dated 09/29/2021-patient was referred for memory problems-patient's B12 level-recommended 1 injection and started on B12 1000 mcg daily."   Per collateral information from brother patient is currently doing well.  Follow-up in clinic in 3 months or sooner if needed.   This note was generated in part or whole with voice recognition software. Voice recognition is usually quite accurate but there are transcription errors that can and very often do occur. I apologize for any typographical errors that were not detected and corrected.       Nilda Calamity, MD 11/03/2021, 11:59 AM

## 2021-11-28 ENCOUNTER — Ambulatory Visit
Admission: RE | Admit: 2021-11-28 | Discharge: 2021-11-28 | Disposition: A | Discharge status: Home / Self Care | Payer: Medicare PPO | Source: Ambulatory Visit | Attending: Infectious Diseases | Admitting: Infectious Diseases

## 2021-11-28 DIAGNOSIS — Z1231 Encounter for screening mammogram for malignant neoplasm of breast: Secondary | ICD-10-CM | POA: Diagnosis present

## 2021-12-10 ENCOUNTER — Other Ambulatory Visit: Payer: Self-pay | Admitting: Psychiatry

## 2021-12-10 DIAGNOSIS — F3176 Bipolar disorder, in full remission, most recent episode depressed: Secondary | ICD-10-CM

## 2021-12-21 ENCOUNTER — Other Ambulatory Visit: Payer: Self-pay | Admitting: Psychiatry

## 2021-12-21 DIAGNOSIS — F5105 Insomnia due to other mental disorder: Secondary | ICD-10-CM

## 2021-12-22 ENCOUNTER — Encounter (INDEPENDENT_AMBULATORY_CARE_PROVIDER_SITE_OTHER): Payer: Self-pay

## 2021-12-31 ENCOUNTER — Telehealth: Payer: Self-pay | Admitting: Psychiatry

## 2021-12-31 DIAGNOSIS — F3176 Bipolar disorder, in full remission, most recent episode depressed: Secondary | ICD-10-CM

## 2021-12-31 DIAGNOSIS — Z9189 Other specified personal risk factors, not elsewhere classified: Secondary | ICD-10-CM

## 2021-12-31 NOTE — Telephone Encounter (Signed)
Ordered EKG  Patient to contact [phone] to get it done.  Once I review EKG will consider increasing her dosage of Abilify.

## 2021-12-31 NOTE — Telephone Encounter (Signed)
Spoke to patient she is ok with having the EKG done prior to the increase

## 2021-12-31 NOTE — Telephone Encounter (Signed)
Patient called stating that she is not sleeping well and wants to know if she can increase the abilify. Please advise

## 2021-12-31 NOTE — Telephone Encounter (Signed)
Yes I am OK with increasing Abilify , but I would like her to get an EKG prior to increasing. If she is OK with that please let me know and I can place the order in. Thank you

## 2022-01-01 NOTE — Telephone Encounter (Signed)
Spoke to patient made her aware of the number to contact for EKG she voiced understanding

## 2022-01-02 ENCOUNTER — Encounter: Payer: Self-pay | Admitting: Psychiatry

## 2022-01-02 ENCOUNTER — Ambulatory Visit (INDEPENDENT_AMBULATORY_CARE_PROVIDER_SITE_OTHER): Payer: Medicare PPO | Admitting: Psychiatry

## 2022-01-02 ENCOUNTER — Other Ambulatory Visit: Payer: Self-pay | Admitting: Physician Assistant

## 2022-01-02 VITALS — BP 116/65 | HR 66 | Temp 98.0°F | Ht 60.0 in | Wt 146.6 lb

## 2022-01-02 DIAGNOSIS — Z9189 Other specified personal risk factors, not elsewhere classified: Secondary | ICD-10-CM

## 2022-01-02 DIAGNOSIS — F3163 Bipolar disorder, current episode mixed, severe, without psychotic features: Secondary | ICD-10-CM | POA: Diagnosis not present

## 2022-01-02 DIAGNOSIS — F5105 Insomnia due to other mental disorder: Secondary | ICD-10-CM | POA: Diagnosis not present

## 2022-01-02 DIAGNOSIS — R2689 Other abnormalities of gait and mobility: Secondary | ICD-10-CM

## 2022-01-02 DIAGNOSIS — R413 Other amnesia: Secondary | ICD-10-CM

## 2022-01-02 NOTE — Progress Notes (Signed)
BH MD OP Progress Note  01/02/2022 12:56 PM MY RINKE  MRN:  [id number]  Chief Complaint:  Chief Complaint  Patient presents with   Follow-up   Medication Problem   Sleeping Problem   Hallucinations   HPI: Adriana Vasquez is a 76 year old Caucasian female, lives in [place], retired, widowed, has a history of bipolar disorder, insomnia was evaluated in office today.  Collateral information obtained from [name]-brother by phone during the session with verbal consent from patient.  Patient today appeared to be alert, oriented to person place time and situation.  Patient was able to answer questions appropriately.  Reports her paranoia and fears about her neighbor is coming back again and overwhelming at times.  She also reports flashes of light that disrupts her sleep at night.  Patient denies any suicidality, homicidality.  Reports appetite is fair.  Patient well-dressed today, continues to be taking care of herself.  Continues to drive, drove herself to the appointment today.  Did not appear to have any memory issues.  3 word memory 3 out of 3 immediately and after 5 minutes.    Per brother -patient believes these flashes of light are coming from camera from someone doing it through the window at night.  Also she seems to be more anxious about the neighbor.  Patient denies any side effects to her Abilify agreeable to increasing the dosage.  Patient denies any other concerns today.  Visit Diagnosis:    ICD-10-CM   1. Severe mixed bipolar 1 disorder (HCC)  F31.63    with psychosis    2. Insomnia due to mental disorder  F51.05    mood    3. At risk for long QT syndrome  Z91.89       Past Psychiatric History: Reviewed past psychiatric history from progress note on 03/22/2019.  Past trials of Geodon, Abilify, trazodone, hydroxyzine, BuSpar.  Inpatient hospital admission at [facility]-08/08/2021 - 08/20/2021.  Past Medical History:  Past Medical  History:  Diagnosis Date   Bipolar disorder [facility])    Personal history of tobacco use, presenting hazards to health 01/22/2015   UTI (urinary tract infection) 07/09/2021    Past Surgical History:  Procedure Laterality Date   ABDOMINAL HYSTERECTOMY     HAND SURGERY Right 2001   Patient not sure of what exactly was done.  Repared index finger knuckle area.    Family Psychiatric History: Reviewed family psychiatric history from progress note on 03/22/2019.  Family History:  Family History  Problem Relation Age of Onset   Arthritis Mother    Heart attack Father    Diabetes Father    Bipolar disorder Father    Breast cancer Neg Hx     Social History: Reviewed social history from progress note on 03/22/2019. Social History   Socioeconomic History   Marital status: Widowed    Spouse name: Not on file   Number of children: Not on file   Years of education: Not on file   Highest education level: Not on file  Occupational History   Not on file  Tobacco Use   Smoking status: Former    Packs/day: 1.00    Years: 35.00    Total pack years: 35.00    Types: Cigarettes    Start date: 09/27/1979   Smokeless tobacco: Never   Tobacco comments:    03/26/2018  Vaping Use   Vaping Use: Never used  Substance and Sexual Activity   Alcohol use: No  Alcohol/week: 0.0 standard drinks of alcohol   Drug use: No   Sexual activity: Not Currently  Other Topics Concern   Not on file  Social History Narrative   Not on file   Social Determinants of Health   Financial Resource Strain: Low Risk  (03/15/2017)   Overall Financial Resource Strain (CARDIA)    Difficulty of Paying Living Expenses: Not hard at all  Food Insecurity: No Food Insecurity (03/15/2017)   Hunger Vital Sign    Worried About Running Out of Food in the Last Year: Never true    Ran Out of Food in the Last Year: Never true  Transportation Needs: No Transportation Needs (03/15/2017)   PRAPARE - Scientist, research (physical sciences) (Medical): No    Lack of Transportation (Non-Medical): No  Physical Activity: Not on file  Stress: Not on file  Social Connections: Not on file    Allergies: No Known Allergies  Metabolic Disorder Labs: Lab Results  Component Value Date   HGBA1C 5.7 (H) 04/10/2020   MPG 117 04/10/2020   Lab Results  Component Value Date   PROLACTIN 20.3 04/10/2020   PROLACTIN 26.7 (H) 02/07/2015   Lab Results  Component Value Date   CHOL 218 (H) 12/07/2014   TRIG 153 (H) 12/07/2014   HDL 60 12/07/2014   LDLCALC 127 (H) 12/07/2014   No results found for: "TSH"  Therapeutic Level Labs: No results found for: "LITHIUM" No results found for: "VALPROATE" No results found for: "CBMZ"  Current Medications: Current Outpatient Medications  Medication Sig Dispense Refill   ARIPiprazole (ABILIFY) 5 MG tablet TAKE 1 TABLET(5 MG) BY MOUTH DAILY 90 tablet 0   busPIRone (BUSPAR) 10 MG tablet Take 1 tablet (10 mg total) by mouth 2 (two) times daily. 180 tablet 0   Calcium Carbonate-Vitamin D 600-400 MG-UNIT per tablet Take by mouth.     gabapentin (NEURONTIN) 300 MG capsule Take 1 capsule (300 mg total) by mouth 3 (three) times daily. 270 capsule 0   melatonin 3 MG TABS tablet Take by mouth.     pantoprazole (PROTONIX) 40 MG tablet Take by mouth.     SPIKEVAX 50 MCG/0.5ML SUSY Inject into the muscle.     traZODone (DESYREL) 50 MG tablet TAKE 1/2 TO 1 TABLET(25 TO 50 MG) BY MOUTH AT BEDTIME AS NEEDED FOR SLEEP 90 tablet 0   No current facility-administered medications for this visit.     Musculoskeletal: Strength & Muscle Tone: within normal limits Gait & Station: normal Patient leans: N/A  Psychiatric Specialty Exam: Review of Systems  Psychiatric/Behavioral:  Positive for dysphoric mood, hallucinations and sleep disturbance. The patient is nervous/anxious.   All other systems reviewed and are negative.   Blood pressure 116/65, pulse 66, temperature 98 F (36.7 C),  temperature source Oral, height 5' (1.524 m), weight 146 lb 9.6 oz (66.5 kg).Body mass index is 28.63 kg/m.  General Appearance: Casual  Eye Contact:  Fair  Speech:  Normal Rate  Volume:  Normal  Mood:  Anxious and Dysphoric  Affect:  Congruent  Thought Process:  Goal Directed and Descriptions of Associations: Intact  Orientation:  Full (Time, Place, and Person)  Thought Content: Hallucinations: Visual and Paranoid Ideation flashes of light , paranoid about neighbor  Suicidal Thoughts:  No  Homicidal Thoughts:  No  Memory:  Immediate;   Fair Recent;   Fair Remote;   Fair  Judgement:  Fair  Insight:  Fair  Psychomotor Activity:  Normal  Concentration:  Concentration: Fair and Attention Span: Fair  Recall:  [employer] of Knowledge: Fair  Language: Fair  Akathisia:  No  Handed:  Right  AIMS (if indicated): done  Assets:  Communication Skills Desire for Improvement Housing Social Support  ADL's:  Intact  Cognition: WNL  Sleep:  Poor   Screenings: AIMS    Flowsheet Row Office Visit from 01/02/2022 in [facility] Office Visit from 11/03/2021 in [facility] Office Visit from 09/04/2021 in [facility] Office Visit from 08/05/2021 in [facility] Office Visit from 07/29/2021 in [facility]  AIMS Total Score 0 0 0 0 0      GAD-7    Flowsheet Row Office Visit from 01/02/2022 in [facility] Office Visit from 11/03/2021 in [facility] Office Visit from 08/13/2020 in [facility]  Total GAD-7 Score 15 3 2       PHQ2-9    Flowsheet Row Office Visit from 01/02/2022 in [facility] Office Visit from 11/03/2021 in [facility] Office Visit from 09/04/2021 in [facility] Office Visit  from 07/29/2021 in [facility] Office Visit from 07/09/2021 in [facility]  PHQ-2 Total Score 3 0 2 3 2   PHQ-9 Total Score 15 2 6 15 11       Flowsheet Row Office Visit from 01/02/2022 in [facility] Office Visit from 11/03/2021 in [facility] Office Visit from 09/04/2021 in [facility]  C-SSRS RISK CATEGORY No Risk No Risk No Risk        Assessment and Plan: KINZI FREDIANI is a 76 year old Caucasian female, widowed, retired, lives in [place], currently with short-term memory problems, paranoia, hallucinations, will benefit from medication readjustment as noted below.  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 nightly. Cogentin 0.5 mg as needed for side effects. BuSpar 10 mg p.o. 3 times a day.  Insomnia-unstable Sleep problems due to her paranoia and anxiety about her neighbor. Will consider increasing Abilify as noted above. Trazodone 25-50 mg p.o. nightly as needed Hydroxyzine 10 mg 1 to 2 tablets as needed for severe anxiety, sleep problems.  At risk for prolonged QT syndrome-EKG order placed, patient to get EKG completed prior to readjusting Abilify dosage.  Reviewed notes per Ms. Sarah Mason-neurology-dated 12/31/2021-patient referred for brain MRI without contrast for memory difficulty and imbalance, referral to gait and balance physical therapy, continue B12 supplement.  And advised to follow-up with psychiatry for increased hallucinations.  Collateral information obtained from brother as noted above.  Follow-up in clinic in 3 to 4 weeks or sooner if needed.  This note was generated in part or whole with voice recognition software. Voice recognition is usually quite accurate but there are transcription errors that can and very often do occur. I apologize for  any typographical errors that were not detected and corrected.      68, MD 01/02/2022, 12:56 PM

## 2022-01-05 ENCOUNTER — Telehealth: Payer: Self-pay | Admitting: Psychiatry

## 2022-01-05 ENCOUNTER — Ambulatory Visit
Admission: RE | Admit: 2022-01-05 | Discharge: 2022-01-05 | Disposition: A | Discharge status: Home / Self Care | Payer: Medicare PPO | Source: Ambulatory Visit | Attending: Psychiatry | Admitting: Psychiatry

## 2022-01-05 DIAGNOSIS — Z9189 Other specified personal risk factors, not elsewhere classified: Secondary | ICD-10-CM | POA: Diagnosis not present

## 2022-01-05 DIAGNOSIS — F3176 Bipolar disorder, in full remission, most recent episode depressed: Secondary | ICD-10-CM | POA: Diagnosis not present

## 2022-01-05 MED ORDER — ARIPIPRAZOLE 5 MG PO TABS: 7.5000 mg | ORAL_TABLET | Freq: Every day | ORAL | 0 refills | Status: DC

## 2022-01-05 NOTE — Telephone Encounter (Signed)
Patient left message on 01-05-22 stating that she completed her EKG and would like to discuss the increase in medication. Please advise

## 2022-01-05 NOTE — Telephone Encounter (Signed)
Reviewed EKG-contacted patient that it is okay to start Abilify at a higher dosage at 7.5 mg.  Patient has supplies and she will use her supplies.

## 2022-01-06 NOTE — Telephone Encounter (Signed)
This was already done yesterday ,Monday 01/05/2022 - as noted in patients chart.

## 2022-01-12 ENCOUNTER — Ambulatory Visit
Admission: RE | Admit: 2022-01-12 | Discharge: 2022-01-12 | Disposition: A | Discharge status: Home / Self Care | Payer: Medicare PPO | Source: Ambulatory Visit | Attending: Physician Assistant | Admitting: Physician Assistant

## 2022-01-12 DIAGNOSIS — R2689 Other abnormalities of gait and mobility: Secondary | ICD-10-CM | POA: Diagnosis present

## 2022-01-12 DIAGNOSIS — R413 Other amnesia: Secondary | ICD-10-CM | POA: Insufficient documentation

## 2022-01-26 ENCOUNTER — Telehealth: Payer: Self-pay

## 2022-01-26 DIAGNOSIS — F3176 Bipolar disorder, in full remission, most recent episode depressed: Secondary | ICD-10-CM

## 2022-01-26 MED ORDER — ARIPIPRAZOLE 15 MG PO TABS: 7.5000 mg | ORAL_TABLET | Freq: Every day | ORAL | 2 refills | Status: DC

## 2022-01-26 NOTE — Telephone Encounter (Signed)
pt needs refill on the abilify. they pharmacy did not received any order for the abilify. she states she will not have enough to get to her appt.

## 2022-01-26 NOTE — Telephone Encounter (Signed)
I have sent Abilify 15 mg, patient to take half tablet to make and 7.5 mg.  I have contacted patient and discussed with patient about this change.  She voiced understanding.

## 2022-02-04 ENCOUNTER — Telehealth: Payer: Self-pay

## 2022-02-04 ENCOUNTER — Ambulatory Visit: Payer: Medicare PPO | Admitting: Psychiatry

## 2022-02-04 DIAGNOSIS — F3176 Bipolar disorder, in full remission, most recent episode depressed: Secondary | ICD-10-CM

## 2022-02-04 DIAGNOSIS — F5105 Insomnia due to other mental disorder: Secondary | ICD-10-CM

## 2022-02-04 NOTE — Telephone Encounter (Signed)
Patient called to request a refill for the following medications please advise  Gabapentin  Buspirone

## 2022-02-05 MED ORDER — GABAPENTIN 300 MG PO CAPS: 300.0000 mg | ORAL_CAPSULE | Freq: Three times a day (TID) | ORAL | 0 refills | Status: DC

## 2022-02-05 MED ORDER — BUSPIRONE HCL 10 MG PO TABS: 10.0000 mg | ORAL_TABLET | Freq: Two times a day (BID) | ORAL | 0 refills | Status: DC

## 2022-02-05 NOTE — Telephone Encounter (Signed)
I have sent gabapentin and BuSpar to pharmacy at [facility].

## 2022-02-25 ENCOUNTER — Ambulatory Visit (INDEPENDENT_AMBULATORY_CARE_PROVIDER_SITE_OTHER): Payer: Medicare PPO | Admitting: Psychiatry

## 2022-02-25 ENCOUNTER — Encounter: Payer: Self-pay | Admitting: Psychiatry

## 2022-02-25 VITALS — BP 124/73 | HR 68 | Temp 97.6°F | Ht 60.0 in | Wt 145.8 lb

## 2022-02-25 DIAGNOSIS — F5105 Insomnia due to other mental disorder: Secondary | ICD-10-CM | POA: Diagnosis not present

## 2022-02-25 DIAGNOSIS — F3177 Bipolar disorder, in partial remission, most recent episode mixed: Secondary | ICD-10-CM | POA: Diagnosis not present

## 2022-02-25 DIAGNOSIS — Z9189 Other specified personal risk factors, not elsewhere classified: Secondary | ICD-10-CM

## 2022-02-25 DIAGNOSIS — F3176 Bipolar disorder, in full remission, most recent episode depressed: Secondary | ICD-10-CM

## 2022-02-25 MED ORDER — TRAZODONE HCL 50 MG PO TABS: ORAL_TABLET | ORAL | 0 refills | Status: DC

## 2022-02-25 NOTE — Progress Notes (Unsigned)
BH MD OP Progress Note  02/25/2022 2:44 PM Adriana Vasquez  MRN:  [id number]  Chief Complaint:  Chief Complaint  Patient presents with   Follow-up   Depression   Medication Refill   HPI: Adriana Vasquez is a 77 year old Caucasian female, lives in [place], retired, widowed, has a history of bipolar disorder, insomnia was evaluated in office today.  Collateral information obtained from [facility] was present in session with patient today.  Patient today appeared to be alert, oriented to person place time situation.  Patient with 3 word memory immediate 3 out of 3, after 5 minutes 3 out of 3.  Patient was able to do serial sevens-calculation well.  Attention and focus seem to be good.  Patient continues to have paranoia, about her neighbor, reports seeing flashes of light as well as hearing noises for an hour or so at night prior to falling asleep.  Only happens at night around this time.  She does not feel threatened although it does worry her.  She however has been able to cope.  Has not been calling her brother for help like she used to before.  Reports inspite of all this she has been able to sleep okay.  Patient denies any suicidality or homicidality.  Reports appetite is fair.  As per brother she has been coping okay and if there are any concerns will be able to Higher education careers adviser.  For now patient as well as brother were okay with continuing the current dosage of Abilify.  Denies any other concerns today.  Visit Diagnosis:    ICD-10-CM   1. Bipolar disorder, in full remission, most recent episode depressed (HCC)  F31.76     2. Insomnia due to mental disorder  F51.05 traZODone (DESYREL) 50 MG tablet   mood    3. At risk for long QT syndrome  Z91.89       Past Psychiatric History: Reviewed past psychiatric history from progress note on 03/22/2019.  Past trials of Geodon, Abilify, trazodone, hydroxyzine, BuSpar.  Inpatient hospital admission at [facility]-08/08/2021 - 08/20/2021.  Past Medical History:  Past Medical History:  Diagnosis Date   Bipolar disorder [facility])    Personal history of tobacco use, presenting hazards to health 01/22/2015   UTI (urinary tract infection) 07/09/2021    Past Surgical History:  Procedure Laterality Date   ABDOMINAL HYSTERECTOMY     HAND SURGERY Right 2001   Patient not sure of what exactly was done.  Repared index finger knuckle area.    Family Psychiatric History: Reviewed family psychiatric history from progress note on 03/22/2019.  Family History:  Family History  Problem Relation Age of Onset   Arthritis Mother    Heart attack Father    Diabetes Father    Bipolar disorder Father    Breast cancer Neg Hx     Social History: Reviewed social history from progress note on 03/22/2019. Social History   Socioeconomic History   Marital status: Widowed    Spouse name: Not on file   Number of children: Not on file   Years of education: Not on file   Highest education level: Not on file  Occupational History   Not on file  Tobacco Use   Smoking status: Former    Packs/day: 1.00    Years: 35.00    Total pack years: 35.00    Types: Cigarettes    Start date: 09/27/1979   Smokeless tobacco: Never   Tobacco comments:  03/26/2018  Vaping Use   Vaping Use: Never used  Substance and Sexual Activity   Alcohol use: No    Alcohol/week: 0.0 standard drinks of alcohol   Drug use: No   Sexual activity: Not Currently  Other Topics Concern   Not on file  Social History Narrative   Not on file   Social Determinants of Health   Financial Resource Strain: Low Risk  (03/15/2017)   Overall Financial Resource Strain (CARDIA)    Difficulty of Paying Living Expenses: Not hard at all  Food Insecurity: No Food Insecurity (03/15/2017)   Hunger Vital Sign    Worried About Running Out of Food in the Last Year: Never true    Ran Out of Food in the Last Year: Never true  Transportation Needs: No  Transportation Needs (03/15/2017)   PRAPARE - Hydrologist (Medical): No    Lack of Transportation (Non-Medical): No  Physical Activity: Not on file  Stress: Not on file  Social Connections: Not on file    Allergies: No Known Allergies  Metabolic Disorder Labs: Lab Results  Component Value Date   HGBA1C 5.7 (H) 04/10/2020   MPG 117 04/10/2020   Lab Results  Component Value Date   PROLACTIN 20.3 04/10/2020   PROLACTIN 26.7 (H) 02/07/2015   Lab Results  Component Value Date   CHOL 218 (H) 12/07/2014   TRIG 153 (H) 12/07/2014   HDL 60 12/07/2014   LDLCALC 127 (H) 12/07/2014   No results found for: "TSH"  Therapeutic Level Labs: No results found for: "LITHIUM" No results found for: "VALPROATE" No results found for: "CBMZ"  Current Medications: Current Outpatient Medications  Medication Sig Dispense Refill   ARIPiprazole (ABILIFY) 15 MG tablet Take 0.5 tablets (7.5 mg total) by mouth daily. 15 tablet 2   aspirin EC 81 MG tablet Take by mouth.     busPIRone (BUSPAR) 10 MG tablet Take 1 tablet (10 mg total) by mouth 2 (two) times daily. 180 tablet 0   Calcium Carbonate-Vitamin D 600-400 MG-UNIT per tablet Take by mouth.     diazepam (VALIUM) 2 MG tablet Take 2mg  30-60 minutes prior to procedure. Can take additional 2mg  tablet immediately prior to procedure as needed. Must have driver     gabapentin (NEURONTIN) 300 MG capsule Take 1 capsule (300 mg total) by mouth 3 (three) times daily. 270 capsule 0   melatonin 3 MG TABS tablet Take by mouth.     SPIKEVAX 50 MCG/0.5ML SUSY Inject into the muscle.     cephALEXin (KEFLEX) 500 MG capsule Take 500 mg by mouth 2 (two) times daily. (Patient not taking: Reported on 02/25/2022)     pantoprazole (PROTONIX) 40 MG tablet Take by mouth. (Patient not taking: Reported on 02/25/2022)     traZODone (DESYREL) 50 MG tablet TAKE 1/2 TO 1 TABLET(25 TO 50 MG) BY MOUTH AT BEDTIME AS NEEDED FOR SLEEP 90 tablet 0   No  current facility-administered medications for this visit.     Musculoskeletal: Strength & Muscle Tone: within normal limits Gait & Station: normal Patient leans: N/A  Psychiatric Specialty Exam: Review of Systems  Psychiatric/Behavioral:  Positive for hallucinations. The patient is nervous/anxious.        Paranoid - coping well  All other systems reviewed and are negative.   Blood pressure 124/73, pulse 68, temperature 97.6 F (36.4 C), temperature source Temporal, height 5' (1.524 m), weight 145 lb 12.8 oz (66.1 kg).Body mass index is 28.47 kg/m.  General Appearance: Casual  Eye Contact:  Fair  Speech:  Normal Rate  Volume:  Normal  Mood:  Anxious coping well  Affect:  Appropriate  Thought Process:  Goal Directed and Descriptions of Associations: Intact  Orientation:  Full (Time, Place, and Person)  Thought Content: Delusions and Paranoid Ideation , AH of noises VH - light flashing ( only for an hour or so at night right before falling asleep ,able to cope  Suicidal Thoughts:  No  Homicidal Thoughts:  No  Memory:  Immediate;   Fair Recent;   Fair Remote;   Fair  Judgement:  Fair  Insight:  Fair  Psychomotor Activity:  Normal  Concentration:  Concentration: Fair and Attention Span: Fair  Recall:  [employer] of Knowledge: Fair  Language: Fair  Akathisia:  No  Handed:  Right  AIMS (if indicated): done  Assets:  Communication Skills Desire for Improvement Housing Social Support  ADL's:  Intact  Cognition: WNL  Sleep:  Fair   Screenings: Administrator, Civil Service Office Visit from 01/02/2022 in [place] Office Visit from 11/03/2021 in [place] Office Visit from 09/04/2021 in [place] Office Visit from 08/05/2021 in [place] Office Visit from 07/29/2021 in [place] Total Score 0 0 0 0 [place] Visit from 02/25/2022 in [place] Office Visit from 01/02/2022 in [place] Visit from 11/03/2021 in [place] Visit from 08/13/2020 in [place]  Total GAD-7 Score 2 15 3 2       PHQ2-9    [place] Office Visit from 02/25/2022 in [place] Visit from 01/02/2022 in [place] Visit from 11/03/2021 in [place] Visit from 09/04/2021 in [place] Visit from 07/29/2021 in [place]  PHQ-2 Total Score 1 3 0 2 3  PHQ-9 Total Score 2 15 2 6 15       [place] Office Visit from 02/25/2022 in [place] Office Visit from 01/02/2022 in [place] Office Visit from 11/03/2021 in [place] No Risk No Risk No Risk        Assessment and Plan: FATEMAH POURCIAU is a 77 year old Caucasian female, widowed, retired, lives in [place], presented for medication management.  Patient continues to have paranoia, hallucinations although able to cope on the current medication regimen.  Discussed plan as noted below.  Plan Bipolar disorder type I severe, mixed with psychosis in partial remission Continue Abilify 7.5 mg p.o. nightly for now.  Will consider increasing the dosage as needed in the future. Patient as well as brother okay with current dosage and will contact writer back as needed. Cogentin 0.5 mg as needed for side effects BuSpar 10 mg p.o. 3 times daily.  Insomnia-stable. Trazodone 25-50 mg p.o. nightly as needed Melatonin 5 to 10 mg p.o. nightly as needed Hydroxyzine 10 mg 1 to 2 tablets as needed for severe anxiety sleep  At risk for prolonged QT  syndrome-EKG reviewed-01/05/2022-sinus bradycardia with arrhythmia, QTc-388.  Will continue to monitor while on medications like Abilify.  Collateral information obtained from brother as noted above.    This note was generated in part or whole with voice recognition software. Voice recognition  is usually quite accurate but there are transcription errors that can and very often do occur. I apologize for any typographical errors that were not detected and corrected.    Jomarie Longs, MD 02/25/2022, 2:44 PM

## 2022-04-29 ENCOUNTER — Ambulatory Visit (INDEPENDENT_AMBULATORY_CARE_PROVIDER_SITE_OTHER): Payer: Medicare PPO | Admitting: Psychiatry

## 2022-04-29 ENCOUNTER — Encounter: Payer: Self-pay | Admitting: Psychiatry

## 2022-04-29 VITALS — BP 113/74 | HR 88 | Temp 98.0°F | Ht 60.0 in | Wt 148.2 lb

## 2022-04-29 DIAGNOSIS — F5105 Insomnia due to other mental disorder: Secondary | ICD-10-CM | POA: Diagnosis not present

## 2022-04-29 DIAGNOSIS — F3176 Bipolar disorder, in full remission, most recent episode depressed: Secondary | ICD-10-CM

## 2022-04-29 MED ORDER — BUSPIRONE HCL 10 MG PO TABS: 10.0000 mg | ORAL_TABLET | Freq: Two times a day (BID) | ORAL | 0 refills | Status: DC

## 2022-04-29 MED ORDER — ARIPIPRAZOLE 15 MG PO TABS: 7.5000 mg | ORAL_TABLET | Freq: Every day | ORAL | 2 refills | Status: DC

## 2022-04-29 MED ORDER — GABAPENTIN 300 MG PO CAPS: 300.0000 mg | ORAL_CAPSULE | Freq: Three times a day (TID) | ORAL | 0 refills | Status: DC

## 2022-04-29 NOTE — Progress Notes (Unsigned)
[facility] MD OP Progress Note  04/29/2022 12:16 PM Adriana Vasquez  MRN:  [id number]  Chief Complaint:  Chief Complaint  Patient presents with   Follow-up   Depression   Anxiety   Hallucinations   Medication Refill   HPI: Adriana Vasquez is a 77 year old Caucasian female, lives in [place], retired, widowed, has a history of bipolar disorder, insomnia was evaluated in office today.  Patient presented along with her brother-[name], who provided collateral information.  Patient today appeared to be alert, oriented to person place time and situation.  Patient with 3 word memory immediate 3 out of 3, after 5 minutes 2 out of 3.  Patient also with attention and focus good in session, was able to do serial sevens without any problem.  Patient was able to answer all questions appropriately.  Did not appear to be responding to internal stimuli.  Reports paranoia as improving she is coping better with her thoughts about flashing lights in her apartment.  Per brother he was able to get her dark shaded blinds in her bedroom and that has definitely helped her.  She however does report possibly hearng noises, white noises not sure if coming from the HVAC system, happens on and off.  Does bother her to some extent.  She has not had a hearing test in a long time and not sure if she is more sensitive to certain sounds.  Agrees to look into that and contact her primary care provider.  Patient denies any significant anxiety or depression symptoms.  Reports sleep is overall good.  Patient denies any suicidality or homicidality.  Patient does report lower extremity swelling as well as leg pain, she has difficulty standing up for too long and walking on and off.  Agrees to contact her primary care provider for evaluation.  Patient denies any other concerns today.  Visit Diagnosis:    ICD-10-CM   1. Bipolar disorder, in full remission, most recent episode depressed (HCC)  F31.76 gabapentin (NEURONTIN) 300 MG capsule     ARIPiprazole (ABILIFY) 15 MG tablet    2. Insomnia due to mental disorder  F51.05 busPIRone (BUSPAR) 10 MG tablet   mood      Past Psychiatric History: I have reviewed past psychiatric history from progress note on 03/22/2019.  Past trials of Geodon, Abilify, trazodone, hydroxyzine, BuSpar.  Inpatient hospital admission at [facility]-08/08/2021 - 08/20/2021.  Past Medical History:  Past Medical History:  Diagnosis Date   Bipolar disorder [facility])    Personal history of tobacco use, presenting hazards to health 01/22/2015   UTI (urinary tract infection) 07/09/2021    Past Surgical History:  Procedure Laterality Date   ABDOMINAL HYSTERECTOMY     HAND SURGERY Right 2001   Patient not sure of what exactly was done.  Repared index finger knuckle area.    Family Psychiatric History: Reviewed family psychiatric history from progress note on 03/22/2019.  Family History:  Family History  Problem Relation Age of Onset   Arthritis Mother    Heart attack Father    Diabetes Father    Bipolar disorder Father    Breast cancer Neg Hx     Social History: Reviewed social history from progress note on 03/22/2019. Social History   Socioeconomic History   Marital status: Widowed    Spouse name: Not on file   Number of children: Not on file   Years of education: Not on file   Highest education level: Not on file  Occupational  History   Not on file  Tobacco Use   Smoking status: Former    Packs/day: 1.00    Years: 35.00    Total pack years: 35.00    Types: Cigarettes    Start date: 09/27/1979   Smokeless tobacco: Never   Tobacco comments:    03/26/2018  Vaping Use   Vaping Use: Never used  Substance and Sexual Activity   Alcohol use: No    Alcohol/week: 0.0 standard drinks of alcohol   Drug use: No   Sexual activity: Not Currently  Other Topics Concern   Not on file  Social History Narrative   Not on file   Social Determinants of Health   Financial  Resource Strain: Low Risk  (03/15/2017)   Overall Financial Resource Strain (CARDIA)    Difficulty of Paying Living Expenses: Not hard at all  Food Insecurity: No Food Insecurity (03/15/2017)   Hunger Vital Sign    Worried About Running Out of Food in the Last Year: Never true    Ran Out of Food in the Last Year: Never true  Transportation Needs: No Transportation Needs (03/15/2017)   PRAPARE - Hydrologist (Medical): No    Lack of Transportation (Non-Medical): No  Physical Activity: Not on file  Stress: Not on file  Social Connections: Not on file    Allergies: No Known Allergies  Metabolic Disorder Labs: Lab Results  Component Value Date   HGBA1C 5.7 (H) 04/10/2020   MPG 117 04/10/2020   Lab Results  Component Value Date   PROLACTIN 20.3 04/10/2020   PROLACTIN 26.7 (H) 02/07/2015   Lab Results  Component Value Date   CHOL 218 (H) 12/07/2014   TRIG 153 (H) 12/07/2014   HDL 60 12/07/2014   LDLCALC 127 (H) 12/07/2014   No results found for: "TSH"  Therapeutic Level Labs: No results found for: "LITHIUM" No results found for: "VALPROATE" No results found for: "CBMZ"  Current Medications: Current Outpatient Medications  Medication Sig Dispense Refill   aspirin EC 81 MG tablet Take by mouth.     Calcium Carbonate-Vitamin D 600-400 MG-UNIT per tablet Take by mouth.     melatonin 3 MG TABS tablet Take by mouth.     SPIKEVAX 50 MCG/0.5ML SUSY Inject into the muscle.     traZODone (DESYREL) 50 MG tablet TAKE 1/2 TO 1 TABLET(25 TO 50 MG) BY MOUTH AT BEDTIME AS NEEDED FOR SLEEP 90 tablet 0   ARIPiprazole (ABILIFY) 15 MG tablet Take 0.5 tablets (7.5 mg total) by mouth daily. 15 tablet 2   busPIRone (BUSPAR) 10 MG tablet Take 1 tablet (10 mg total) by mouth 2 (two) times daily. 180 tablet 0   gabapentin (NEURONTIN) 300 MG capsule Take 1 capsule (300 mg total) by mouth 3 (three) times daily. 270 capsule 0   No current facility-administered medications  for this visit.     Musculoskeletal: Strength & Muscle Tone: within normal limits Gait & Station: normal Patient leans: N/A  Psychiatric Specialty Exam: Review of Systems  Musculoskeletal:        Left lower leg swelling , BL leg pain  Psychiatric/Behavioral:  Positive for hallucinations (questionable AH).   All other systems reviewed and are negative.   Blood pressure 113/74, pulse 88, temperature 98 F (36.7 C), temperature source Temporal, height 5' (1.524 m), weight 148 lb 3.2 oz (67.2 kg).Body mass index is 28.94 kg/m.  General Appearance: Casual  Eye Contact:  Fair  Speech:  Clear  and Coherent  Volume:  Normal  Mood:  Euthymic  Affect:  Full Range  Thought Process:  Goal Directed and Descriptions of Associations: Intact  Orientation:  Full (Time, Place, and Person)  Thought Content: Hallucinations: Auditory and Paranoid Ideation paranoia is improving she is able to cope, does report hearing white noise in her apartment not sure if this is coming from the HVAC system  Suicidal Thoughts:  No  Homicidal Thoughts:  No  Memory:  Immediate;   Fair Recent;   Fair Remote;   Fair  Judgement:  Fair  Insight:  Fair  Psychomotor Activity:  Normal  Concentration:  Concentration: Fair and Attention Span: Fair  Recall:  [employer] of Knowledge: Fair  Language: Fair  Akathisia:  No  Handed:  Right  AIMS (if indicated): done  Assets:  Communication Skills Desire for [place] Talents/Skills Transportation  ADL's:  Intact  Cognition: WNL  Sleep:  Fair   Screenings: [place] Visit from 04/29/2022 in [place] Office Visit from 02/25/2022 in [place] Office Visit from 01/02/2022 in [place] Office Visit from 11/03/2021 in [place] Office Visit from  09/04/2021 in [place] Total Score 0 0 0 0 0      [place] Office Visit from 04/29/2022 in [place] Office Visit from 02/25/2022 in [place] Office Visit from 01/02/2022 in [place] Office Visit from 11/03/2021 in [place] Office Visit from 08/13/2020 in [place]  Total GAD-7 Score '2 2 15 3 2      '$ PHQ2-9    [place] Office Visit from 04/29/2022 in [place] Office Visit from 02/25/2022 in [place] Office Visit from 01/02/2022 in [place] Office Visit from 11/03/2021 in [place] Office Visit from 09/04/2021 in [place]  PHQ-2 Total Score '1 1 3 '$ 0 2  PHQ-9 Total Score '4 2 15 2 6      '$ [place] Office Visit from 04/29/2022 in [place] Office Visit from 02/25/2022 in [place] Office Visit from 01/02/2022 in [place] No Risk No Risk No Risk        Assessment and Plan: BROOK ZEINER is a 77 year old Caucasian female, widowed, retired, lives in [place], presented for medication management.  Patient with improvement in her mood symptoms, psychosis although with questionable hallucination as noted above and physical symptoms of leg swelling and leg pain, will benefit from the following plan.  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 nightly. Cogentin 0.5 mg as needed for side  effects BuSpar 10 mg p.o. 3 times daily. Gabapentin 300 mg p.o. 3 times daily-initiated by neurology are currently being prescribed by this provider, gabapentin is also a mood stabilizer.  Insomnia-able Trazodone 25-50 mg p.o. nightly as needed Melatonin 5 to 10 mg p.o. nightly as needed Hydroxyzine 10 mg take 1 to 2 tablets as needed for severe anxiety   Patient advised to follow  up with primary care provider for leg pain and swelling.  Collateral information obtained from brother as noted above.  Consent: Patient/Guardian gives verbal consent for treatment and assignment of benefits for services provided during this visit. Patient/Guardian expressed understanding and agreed to proceed.   This note was generated in part or whole with voice recognition software. Voice recognition is usually quite accurate but there are transcription errors that can and very often do occur. I apologize for any typographical errors that were not detected and corrected.      Ursula Alert, MD 04/30/2022, 8:55 AM

## 2022-05-05 DIAGNOSIS — R7303 Prediabetes: Secondary | ICD-10-CM | POA: Insufficient documentation

## 2022-05-20 ENCOUNTER — Other Ambulatory Visit: Payer: Self-pay | Admitting: Orthopedic Surgery

## 2022-05-20 DIAGNOSIS — M48061 Spinal stenosis, lumbar region without neurogenic claudication: Secondary | ICD-10-CM

## 2022-05-25 ENCOUNTER — Encounter: Payer: Self-pay | Admitting: Orthopedic Surgery

## 2022-06-01 ENCOUNTER — Other Ambulatory Visit: Payer: Medicare PPO

## 2022-06-05 ENCOUNTER — Ambulatory Visit
Admission: RE | Admit: 2022-06-05 | Discharge: 2022-06-05 | Disposition: A | Discharge status: Home / Self Care | Payer: Medicare PPO | Source: Ambulatory Visit | Attending: Infectious Diseases | Admitting: Infectious Diseases

## 2022-06-05 DIAGNOSIS — Z122 Encounter for screening for malignant neoplasm of respiratory organs: Secondary | ICD-10-CM | POA: Insufficient documentation

## 2022-06-05 DIAGNOSIS — Z87891 Personal history of nicotine dependence: Secondary | ICD-10-CM | POA: Diagnosis present

## 2022-06-08 ENCOUNTER — Encounter: Payer: Self-pay | Admitting: Psychiatry

## 2022-06-08 ENCOUNTER — Telehealth (INDEPENDENT_AMBULATORY_CARE_PROVIDER_SITE_OTHER): Payer: Medicare PPO | Admitting: Psychiatry

## 2022-06-08 DIAGNOSIS — F3176 Bipolar disorder, in full remission, most recent episode depressed: Secondary | ICD-10-CM

## 2022-06-08 DIAGNOSIS — F5105 Insomnia due to other mental disorder: Secondary | ICD-10-CM

## 2022-06-08 MED ORDER — ARIPIPRAZOLE 5 MG PO TABS
5.0000 mg | ORAL_TABLET | Freq: Every day | ORAL | 1 refills | Status: DC
Start: 2022-06-08 — End: 2022-08-05

## 2022-06-08 MED ORDER — TRAZODONE HCL 50 MG PO TABS
ORAL_TABLET | ORAL | 0 refills | Status: DC
Start: 2022-06-08 — End: 2022-10-13

## 2022-06-08 NOTE — Progress Notes (Unsigned)
Virtual Visit via Telephone Note  I connected with [name] on 06/08/22 at  1:30 PM EDT by telephone and verified that I am speaking with the correct person using two identifiers.  Location Provider Location : [facility] Patient Location : Home  Participants: Patient , Provider   I discussed the limitations, risks, security and privacy concerns of performing an evaluation and management service by telephone and the availability of in person appointments. I also discussed with the patient that there may be a patient responsible charge related to this service. The patient expressed understanding and agreed to proceed.   I discussed the assessment and treatment plan with the patient. The patient was provided an opportunity to ask questions and all were answered. The patient agreed with the plan and demonstrated an understanding of the instructions.   The patient was advised to call back or seek an in-person evaluation if the symptoms worsen or if the condition fails to improve as anticipated.   BH MD OP Progress Note  06/09/2022 1:09 PM Adriana Vasquez  MRN:  [id number]  Chief Complaint:  Chief Complaint  Patient presents with   Follow-up   Anxiety   Medication Refill   HPI: Adriana Vasquez is a 77 year old Caucasian female, lives in [place], retired, widowed, has a history of bipolar disorder, insomnia was evaluated by phone today.  Patient today reports overall she is doing fairly well on the current medication regimen.  She reports she is tolerating the lower dosage of Abilify.  She reports she continues to be paranoid about her neighbors however she has been coping okay.  She does stay separated from her neighbor and it does not really affect her functioning.  She reports that white noises coming from the HVAC system are not bothering her like it used to before.  She hence reports overall she is doing better.  She is interested in dosage reduction of Abilify.  Patient reports  sleep is overall good.  She does have lower extremity pain and is currently undergoing evaluation and treatment by her primary care provider.  Patient denies any suicidality or homicidality.  Continues to have good support system from her brother.  Patient appeared to be alert, oriented to person place time and situation.  3 word memory immediate 3 out of 3, after 5 minutes 3 out of 3.  Patient was able to do serial threes, calculation.  Patient denies any other concerns today.  Visit Diagnosis:    ICD-10-CM   1. 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  Past trials of Geodon, Abilify, trazodone, hydroxyzine, BuSpar.  Inpatient hospital admission at [facility]-08/08/2021 - 08/20/2021.  Past Medical History:  Past Medical History:  Diagnosis Date   Bipolar disorder    Personal history of tobacco use, presenting hazards to health 01/22/2015   UTI (urinary tract infection) 07/09/2021    Past Surgical History:  Procedure Laterality Date   ABDOMINAL HYSTERECTOMY     HAND SURGERY Right 2001   Patient not sure of what exactly was done.  Repared index finger knuckle area.    Family Psychiatric History: I have reviewed family psychiatric history from progress note on 03/22/2019.  Family History:  Family History  Problem Relation Age of Onset   Arthritis Mother    Heart attack Father    Diabetes Father    Bipolar disorder Father    Breast cancer Neg Hx     Social History: I have reviewed social history from progress note on 03/22/2019. Social History   Socioeconomic History   Marital status: Widowed    Spouse name: Not on file   Number of children: Not on file   Years of education: Not on file    Highest education level: Not on file  Occupational History   Not on file  Tobacco Use   Smoking status: Former    Packs/day: 1.00    Years: 35.00    Additional pack years: 0.00    Total pack years: 35.00    Types: Cigarettes    Start date: 09/27/1979   Smokeless tobacco: Never   Tobacco comments:    03/26/2018  Vaping Use   Vaping Use: Never used  Substance and Sexual Activity   Alcohol use: No    Alcohol/week: 0.0 standard drinks of alcohol   Drug use: No   Sexual activity: Not Currently  Other Topics Concern   Not on file  Social History Narrative   Not on file   Social Determinants of Health   Financial Resource Strain: Low Risk  (03/15/2017)   Overall Financial Resource Strain (CARDIA)    Difficulty of Paying Living Expenses: Not hard at all  Food Insecurity: No Food Insecurity (03/15/2017)   Hunger Vital Sign    Worried About Running Out of Food in the Last Year: Never true    Ran Out of Food in the Last Year: Never true  Transportation Needs: No Transportation Needs (03/15/2017)   PRAPARE - Administrator, Civil Service (Medical): No    Lack of Transportation (Non-Medical): No  Physical Activity: Not on file  Stress: Not on file  Social Connections: Not on file    Allergies: No Known Allergies  Metabolic Disorder Labs: Lab Results  Component Value Date   HGBA1C 5.7 (H) 04/10/2020   MPG 117 04/10/2020   Lab Results  Component Value Date   PROLACTIN 20.3 04/10/2020   PROLACTIN 26.7 (H) 02/07/2015   Lab Results  Component Value Date   CHOL 218 (H) 12/07/2014   TRIG 153 (H) 12/07/2014   HDL 60 12/07/2014   LDLCALC 127 (H) 12/07/2014   No results found for: "TSH"  Therapeutic Level Labs: No results found for: "LITHIUM" No results found for: "VALPROATE" No results found for: "CBMZ"  Current Medications: Current Outpatient Medications  Medication Sig Dispense Refill   ARIPiprazole (ABILIFY) 5 MG tablet Take 1 tablet (5 mg total) by mouth  daily. 30 tablet 1   aspirin EC 81 MG tablet Take by mouth.     busPIRone (BUSPAR) 10 MG tablet Take 1 tablet (10 mg total) by mouth 2 (two) times daily. 180 tablet 0   Calcium Carbonate-Vitamin D 600-400 MG-UNIT per tablet Take by mouth.     gabapentin (NEURONTIN) 300 MG capsule Take 1 capsule (300 mg total) by mouth 3 (three) times daily. 270 capsule 0   melatonin 3 MG TABS tablet Take by mouth.     SPIKEVAX 50 MCG/0.5ML SUSY Inject into the muscle.     traZODone (DESYREL) 50 MG tablet TAKE 1/2 TO 1 TABLET(25 TO 50 MG) BY MOUTH AT BEDTIME AS NEEDED FOR SLEEP 90 tablet 0   No current facility-administered medications for this visit.  Musculoskeletal: Strength & Muscle Tone:  UTA Gait & Station:  UTA Patient leans: N/A  Psychiatric Specialty Exam: Review of Systems  Unable to perform ROS: Psychiatric disorder    There were no vitals taken for this visit.There is no height or weight on file to calculate BMI.  General Appearance:  UTA  Eye Contact:   UTA  Speech:  Clear and Coherent  Volume:  Normal  Mood:  Euthymic  Affect:   UTA  Thought Process:  Goal Directed and Descriptions of Associations: Intact  Orientation:  Full (Time, Place, and Person)  Thought Content: Hallucinations: Auditory and Paranoid Ideation Not as noisy now, improving  Suicidal Thoughts:  No  Homicidal Thoughts:  No  Memory:  Immediate;   Fair Recent;   Fair Remote;   Fair  Judgement:  Fair  Insight:  Fair  Psychomotor Activity:   UTA  Concentration:  Concentration: Fair and Attention Span: Fair  Recall:  [employer] of Knowledge: Fair  Language: Fair  Akathisia:  No  Handed:  Right  AIMS (if indicated): not done  Assets:  Communication Skills Desire for Improvement Housing Social Support  ADL's:  Intact  Cognition: WNL  Sleep:  Fair   Screenings: Midwife Visit from 04/29/2022 in [facility] Office Visit from 02/25/2022 in [facility] Office Visit from 01/02/2022 in [facility] Office Visit from 11/03/2021 in [facility] Office Visit from 09/04/2021 in [facility]  AIMS Total Score 0 0 0 0 0      GAD-7    Flowsheet Row Office Visit from 04/29/2022 in [facility] Office Visit from 02/25/2022 in [facility] Office Visit from 01/02/2022 in [facility] Office Visit from 11/03/2021 in [facility] Office Visit from 08/13/2020 in [facility]  Total GAD-7 Score 2 2 15 3 2       PHQ2-9    Flowsheet Row Office Visit from 04/29/2022 in [facility] Office Visit from 02/25/2022 in [facility] Office Visit from 01/02/2022 in [facility] Office Visit from 11/03/2021 in [facility] Office Visit from 09/04/2021 in [facility]  PHQ-2 Total Score 1 1 3  0 2  PHQ-9 Total Score 4 2 15 2 6       Flowsheet Row Office Visit from 04/29/2022 in [facility] Office Visit from 02/25/2022 in [facility] Office Visit from 01/02/2022 in [facility]  C-SSRS RISK CATEGORY No Risk No Risk No Risk        Assessment and Plan: Adriana Vasquez is a 77 year old Caucasian female, widowed, retired, lives in [place], was evaluated by phone today.  Patient is currently improving, agreeable to dosage reduction of Abilify, patient is worried about long-term side effects of Abilify.  Plan as  noted below.  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 nightly Cogentin 0.5 mg as needed for side effects BuSpar 10 mg p.o. 3 times daily Gabapentin 300 mg p.o. 3 times daily-initiated by neurology however it is also a mood stabilizer  Insomnia-stable Trazodone 25-50  mg p.o. nightly as needed Melatonin 5 to 10 mg p.o. nightly as needed Hydroxyzine 10 mg, 1 to 2 tablets as needed for severe anxiety  Follow-up in clinic in 6 weeks or sooner if needed, by telemedicine.   I have spent at least 15 minutes non face to face with patient today .    Consent: Patient/Guardian gives verbal consent for treatment and assignment of benefits for services provided during this visit. Patient/Guardian expressed understanding and agreed to proceed.   This note was generated in part or whole with voice recognition software. Voice recognition is usually quite accurate but there are transcription errors that can and very often do occur. I apologize for any typographical errors that were not detected and corrected.  This note was generated in part or whole with voice recognition software. Voice recognition is usually quite accurate but there are transcription errors that can and very often do occur. I apologize for any typographical errors that were not detected and corrected.     Jomarie Longs, MD 06/09/2022, 1:09 PM

## 2022-06-09 ENCOUNTER — Other Ambulatory Visit: Payer: Self-pay

## 2022-06-09 DIAGNOSIS — Z87891 Personal history of nicotine dependence: Secondary | ICD-10-CM

## 2022-06-09 DIAGNOSIS — Z122 Encounter for screening for malignant neoplasm of respiratory organs: Secondary | ICD-10-CM

## 2022-06-16 ENCOUNTER — Ambulatory Visit
Admission: RE | Admit: 2022-06-16 | Discharge: 2022-06-16 | Disposition: A | Discharge status: Home / Self Care | Payer: Medicare PPO | Source: Ambulatory Visit | Attending: Orthopedic Surgery | Admitting: Orthopedic Surgery

## 2022-06-16 DIAGNOSIS — M48061 Spinal stenosis, lumbar region without neurogenic claudication: Secondary | ICD-10-CM

## 2022-08-04 ENCOUNTER — Other Ambulatory Visit: Payer: Self-pay | Admitting: Psychiatry

## 2022-08-04 DIAGNOSIS — F3176 Bipolar disorder, in full remission, most recent episode depressed: Secondary | ICD-10-CM

## 2022-08-20 ENCOUNTER — Other Ambulatory Visit: Payer: Self-pay | Admitting: Psychiatry

## 2022-08-20 DIAGNOSIS — F5105 Insomnia due to other mental disorder: Secondary | ICD-10-CM

## 2022-08-24 ENCOUNTER — Other Ambulatory Visit: Payer: Self-pay | Admitting: Psychiatry

## 2022-08-24 DIAGNOSIS — F3176 Bipolar disorder, in full remission, most recent episode depressed: Secondary | ICD-10-CM

## 2022-09-10 ENCOUNTER — Telehealth: Payer: Medicare PPO | Admitting: Psychiatry

## 2022-09-29 ENCOUNTER — Other Ambulatory Visit: Payer: Self-pay | Admitting: Psychiatry

## 2022-09-29 DIAGNOSIS — F3176 Bipolar disorder, in full remission, most recent episode depressed: Secondary | ICD-10-CM

## 2022-10-13 ENCOUNTER — Telehealth (INDEPENDENT_AMBULATORY_CARE_PROVIDER_SITE_OTHER): Payer: Medicare PPO | Admitting: Psychiatry

## 2022-10-13 ENCOUNTER — Encounter: Payer: Self-pay | Admitting: Psychiatry

## 2022-10-13 DIAGNOSIS — F5105 Insomnia due to other mental disorder: Secondary | ICD-10-CM

## 2022-10-13 DIAGNOSIS — Z79899 Other long term (current) drug therapy: Secondary | ICD-10-CM | POA: Diagnosis not present

## 2022-10-13 DIAGNOSIS — F3176 Bipolar disorder, in full remission, most recent episode depressed: Secondary | ICD-10-CM

## 2022-10-13 MED ORDER — TRAZODONE HCL 50 MG PO TABS: ORAL_TABLET | ORAL | 1 refills | Status: DC

## 2022-10-13 NOTE — Progress Notes (Signed)
Virtual Visit via Telephone Note  I connected with Adriana Vasquez on 10/13/22 at  8:30 AM EDT by telephone and verified that I am speaking with the correct person using two identifiers.  Location Provider Location : [facility] Patient Location : Home  Participants: Patient , Provider I discussed the limitations, risks, security and privacy concerns of performing an evaluation and management service by telephone and the availability of in person appointments. I also discussed with the patient that there may be a patient responsible charge related to this service. The patient expressed understanding and agreed to proceed.   I discussed the assessment and treatment plan with the patient. The patient was provided an opportunity to ask questions and all were answered. The patient agreed with the plan and demonstrated an understanding of the instructions.   The patient was advised to call back or seek an in-person evaluation if the symptoms worsen or if the condition fails to improve as anticipated.                                                                         BH MD OP Progress Note  10/13/2022 8:58 AM NASYAH GOTH  MRN:  [id number]  Chief Complaint:  Chief Complaint  Patient presents with   Follow-up   Hallucinations   Depression   Medication Refill   HPI: Adriana Vasquez is a 77 year old Caucasian female, lives in [place], retired, widowed, has a history of bipolar disorder, insomnia was evaluated by phone today.  Patient today reports she is currently doing well.  She denies any significant mood swings.  Denies any hallucinations.  Reports she is sleeping well.  She does have back pain which does have an impact on her day-to-day activities and she currently is planning to follow up with her primary care provider.  The gabapentin does help.  She however has been spending time with her friends and her brother.  Her brother continues to be supportive.  Patient denies any  suicidality, homicidality or perceptual disturbances.  Patient is compliant on medications like Abilify, BuSpar, gabapentin, trazodone.  Denies side effects.  Patient denies any other concerns today.  Visit Diagnosis:    ICD-10-CM   1. Bipolar disorder, in full remission, most recent episode depressed (HCC)  F31.76     2. Insomnia due to mental disorder  F51.05 traZODone (DESYREL) 50 MG tablet   mood    3. High risk medication use  Z79.899       Past Psychiatric History: I have reviewed past psychiatric history from progress note on 03/22/2019.  Past trials of Geodon, Abilify, trazodone, hydroxyzine, BuSpar.  Inpatient hospital admission at [facility]-08/08/2021 - 08/20/2021.  Past Medical History:  Past Medical History:  Diagnosis Date   Bipolar disorder [facility])    Personal history of tobacco use, presenting hazards to health 01/22/2015   UTI (urinary tract infection) 07/09/2021    Past Surgical History:  Procedure Laterality Date   ABDOMINAL HYSTERECTOMY     HAND SURGERY Right 2001   Patient not sure of what exactly was done.  Repared index finger knuckle area.    Family Psychiatric History: Reviewed family psychiatric history from progress note on 03/22/2019.  Family History:  Family History  Problem Relation Age of Onset   Arthritis Mother    Heart attack Father    Diabetes Father    Bipolar disorder Father    Breast cancer Neg Hx     Social History: Reviewed social history from progress note on 03/22/2019. Social History   Socioeconomic History   Marital status: Widowed    Spouse name: Not on file   Number of children: Not on file   Years of education: Not on file   Highest education level: Not on file  Occupational History   Not on file  Tobacco Use   Smoking status: Former    Current packs/day: 1.00    Average packs/day: 1 pack/day for 43.0 years (43.0 ttl pk-yrs)    Types: Cigarettes    Start date: 09/27/1979   Smokeless tobacco:  Never   Tobacco comments:    03/26/2018  Vaping Use   Vaping status: Never Used  Substance and Sexual Activity   Alcohol use: No    Alcohol/week: 0.0 standard drinks of alcohol   Drug use: No   Sexual activity: Not Currently  Other Topics Concern   Not on file  Social History Narrative   Not on file   Social Determinants of Health   Financial Resource Strain: Low Risk  (06/18/2022)   Received from [facility], [facility]   Overall Financial Resource Strain (CARDIA)    Difficulty of Paying Living Expenses: Not hard at all  Food Insecurity: No Food Insecurity (06/18/2022)   [facility], [facility]   Hunger Vital Sign    Worried About Running Out of Food in the Last Year: Never true    Ran Out of Food in the Last Year: Never true  Transportation Needs: No Transportation Needs (06/18/2022)   Received from [facility], [facility]   [facility] - Transportation    In the past 12 months, has lack of transportation kept you from medical appointments or from getting medications?: No    Lack of Transportation (Non-Medical): No  Physical Activity: Not on file  Stress: Stress Concern Present (08/08/2021)   Received from [facility] - Occupational Stress Questionnaire    Feeling of Stress : To some extent  Social Connections: Unknown (08/08/2021)   Received from [facility]   Social Network    Social Network: Not on file    Allergies: No Known Allergies  Metabolic Disorder Labs: Lab Results  Component Value Date   HGBA1C 5.7 (H) 04/10/2020   MPG 117 04/10/2020   Lab Results  Component Value Date   PROLACTIN 20.3 04/10/2020   PROLACTIN 26.7 (H) 02/07/2015   Lab Results  Component Value Date   CHOL 218 (H) 12/07/2014   TRIG 153 (H) 12/07/2014   HDL 60 12/07/2014   LDLCALC 127 (H) 12/07/2014   No results found  for: "TSH"  Therapeutic Level Labs: No results found for: "LITHIUM" No results found for: "VALPROATE" No results found for: "CBMZ"  Current Medications: Current Outpatient Medications  Medication Sig Dispense Refill   ARIPiprazole (ABILIFY) 5 MG tablet TAKE 1 TABLET(5 MG) BY MOUTH DAILY 30 tablet 1   aspirin EC 81 MG tablet Take by mouth.     busPIRone (BUSPAR) 10 MG tablet TAKE 1 TABLET(10 MG) BY MOUTH TWICE DAILY 180 tablet 0   Calcium Carbonate-Vitamin D 600-400 MG-UNIT per tablet Take by mouth.  gabapentin (NEURONTIN) 300 MG capsule TAKE 1 CAPSULE(300 MG) BY MOUTH THREE TIMES DAILY 270 capsule 0   melatonin 3 MG TABS tablet Take by mouth.     SPIKEVAX 50 MCG/0.5ML SUSY Inject into the muscle. (Patient not taking: Reported on 10/13/2022)     traZODone (DESYREL) 50 MG tablet TAKE 1/2 TO 1 TABLET(25 TO 50 MG) BY MOUTH AT BEDTIME AS NEEDED FOR SLEEP 90 tablet 1   No current facility-administered medications for this visit.     Musculoskeletal: Strength & Muscle Tone:  UTA Gait & Station:  Seated Patient leans: N/A  Psychiatric Specialty Exam: Review of Systems  Psychiatric/Behavioral: Negative.      There were no vitals taken for this visit.There is no height or weight on file to calculate BMI.  General Appearance:  UTA  Eye Contact:   UTA  Speech:  Clear and Coherent  Volume:  Normal  Mood:  Euthymic  Affect:   UTA  Thought Process:  Goal Directed and Descriptions of Associations: Intact  Orientation:  Full (Time, Place, and Person)  Thought Content: Logical   Suicidal Thoughts:  No  Homicidal Thoughts:  No  Memory:  Immediate;   Fair Recent;   Fair Remote;   Fair  Judgement:  Fair  Insight:  Fair  Psychomotor Activity:   UTA  Concentration:  Concentration: Fair and Attention Span: Fair  Recall:  [employer] of Knowledge: Fair  Language: Fair  Akathisia:  No  Handed:  Right  AIMS (if indicated): not done  Assets:  Communication Skills Desire for  Improvement Housing Social Support  ADL's:  Intact  Cognition: WNL  Sleep:  Fair   Screenings: Midwife Visit from 04/29/2022 in [facility] Office Visit from 02/25/2022 in [facility] Office Visit from 01/02/2022 in [facility] Office Visit from 11/03/2021 in [facility] Office Visit from 09/04/2021 in [facility]  AIMS Total Score 0 0 0 0 0      GAD-7    Flowsheet Row Video Visit from 10/13/2022 in [facility] Office Visit from 04/29/2022 in [facility] Office Visit from 02/25/2022 in [facility] Office Visit from 01/02/2022 in [facility] Office Visit from 11/03/2021 in [facility]  Total GAD-7 Score 0 2 2 15 3       PHQ2-9    Flowsheet Row Video Visit from 10/13/2022 in [facility] Office Visit from 04/29/2022 in [facility] Office Visit from 02/25/2022 in [facility] Office Visit from 01/02/2022 in [facility] Office Visit from 11/03/2021 in [facility]  PHQ-2 Total Score 0 1 1 3  0  PHQ-9 Total Score -- 4 2 15 2       Flowsheet Row Video Visit from 10/13/2022 in [facility] Office Visit from 04/29/2022 in [facility] Office Visit from 02/25/2022 in [facility]  C-SSRS RISK CATEGORY No Risk No Risk No Risk        Assessment and Plan: ARHA KOWALCZYK is a  77 year old Caucasian female, widowed, retired, lives in [place] was evaluated by phone today.  Patient is currently  stable although she does have back pain and is motivated to follow up with her primary care provider for management.  Plan as noted below.  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 nightly Cogentin 0.5 mg as needed for side effects of Abilify. BuSpar 10 mg p.o. 3 times daily Gabapentin 300 mg p.o. 3 times daily-initiated by neurology although it is a mood stabilizer.  Insomnia-stable Trazodone 25-50 mg p.o. nightly as needed Melatonin 5 to 10 mg p.o. nightly as needed Hydroxyzine 10 mg, 1 to 2 tablets as needed for severe anxiety.  High risk medication use-patient will benefit from repeating TSH, lipid panel.  Patient to get it completed at primary care provider's office.     Collaboration of Care: Collaboration of Care: Primary Care Provider AEB patient advised to follow up with primary care for management of pain.  Patient/Guardian was advised Release of Information must be obtained prior to any record release in order to collaborate their care with an outside provider. Patient/Guardian was advised if they have not already done so to contact the registration department to sign all necessary forms in order for [place] to release information regarding their care.   Consent: Patient/Guardian gives verbal consent for treatment and assignment of benefits for services provided during this visit. Patient/Guardian expressed understanding and agreed to proceed.    I have spent at least 14 minutes non face to face with patient today.   This note was generated in part or whole with voice recognition software. Voice recognition is usually quite accurate but there are transcription errors that can and very often do occur. I apologize for any typographical errors that were not detected and corrected.    Jomarie Longs, MD 10/13/2022,  8:58 AM

## 2022-11-10 ENCOUNTER — Other Ambulatory Visit: Payer: Self-pay | Admitting: Child and Adolescent Psychiatry

## 2022-11-10 DIAGNOSIS — F5105 Insomnia due to other mental disorder: Secondary | ICD-10-CM

## 2022-11-11 NOTE — Telephone Encounter (Signed)
Dr. Elvera Maria pt

## 2022-11-18 ENCOUNTER — Other Ambulatory Visit: Payer: Self-pay | Admitting: Psychiatry

## 2022-11-18 DIAGNOSIS — F3176 Bipolar disorder, in full remission, most recent episode depressed: Secondary | ICD-10-CM

## 2022-11-22 ENCOUNTER — Other Ambulatory Visit: Payer: Self-pay | Admitting: Psychiatry

## 2022-11-22 DIAGNOSIS — F3176 Bipolar disorder, in full remission, most recent episode depressed: Secondary | ICD-10-CM

## 2022-11-23 ENCOUNTER — Other Ambulatory Visit: Payer: Self-pay | Admitting: Infectious Diseases

## 2022-11-23 DIAGNOSIS — Z1231 Encounter for screening mammogram for malignant neoplasm of breast: Secondary | ICD-10-CM

## 2022-11-30 ENCOUNTER — Other Ambulatory Visit: Payer: Self-pay | Admitting: Psychiatry

## 2022-11-30 DIAGNOSIS — F3176 Bipolar disorder, in full remission, most recent episode depressed: Secondary | ICD-10-CM

## 2022-12-01 NOTE — Telephone Encounter (Signed)
Abilify approved and sent to pharmacy.

## 2022-12-01 NOTE — Telephone Encounter (Signed)
left message that rx was sent to the pharmacy and is she has any questions or concerns to please cal our office back.

## 2022-12-01 NOTE — Telephone Encounter (Signed)
pt called states that pharmacy said they sent over a request and has not hear back from you. but she needs a refill on the abilify. pt was last seen on 8-20 next appt 12-17

## 2023-02-09 ENCOUNTER — Encounter: Payer: Self-pay | Admitting: Psychiatry

## 2023-02-09 ENCOUNTER — Telehealth (INDEPENDENT_AMBULATORY_CARE_PROVIDER_SITE_OTHER): Payer: Medicare PPO | Admitting: Psychiatry

## 2023-02-09 DIAGNOSIS — Z79899 Other long term (current) drug therapy: Secondary | ICD-10-CM | POA: Diagnosis not present

## 2023-02-09 DIAGNOSIS — F3161 Bipolar disorder, current episode mixed, mild: Secondary | ICD-10-CM

## 2023-02-09 DIAGNOSIS — F5105 Insomnia due to other mental disorder: Secondary | ICD-10-CM | POA: Diagnosis not present

## 2023-02-09 DIAGNOSIS — Z9189 Other specified personal risk factors, not elsewhere classified: Secondary | ICD-10-CM | POA: Diagnosis not present

## 2023-02-09 MED ORDER — BUSPIRONE HCL 10 MG PO TABS: 10.0000 mg | ORAL_TABLET | Freq: Two times a day (BID) | ORAL | 1 refills | Status: DC

## 2023-02-09 MED ORDER — ARIPIPRAZOLE 5 MG PO TABS: 7.5000 mg | ORAL_TABLET | Freq: Every day | ORAL | 0 refills | Status: DC

## 2023-02-09 NOTE — Patient Instructions (Signed)
Please call for [id number] 

## 2023-02-09 NOTE — Progress Notes (Unsigned)
Virtual Visit via Video Note  I connected with Adriana Vasquez on 02/09/23 at 11:30 AM EST by a video enabled telemedicine application and verified that I am speaking with the correct person using two identifiers.  Location Provider Location : [facility] Patient Location : Home  Participants: Patient ,Brett Canales, Provider    I discussed the limitations of evaluation and management by telemedicine and the availability of in person appointments. The patient expressed understanding and agreed to proceed.  I discussed the assessment and treatment plan with the patient. The patient was provided an opportunity to ask questions and all were answered. The patient agreed with the plan and demonstrated an understanding of the instructions.   The patient was advised to call back or seek an in-person evaluation if the symptoms worsen or if the condition fails to improve as anticipated.   BH MD OP Progress Note  02/10/2023 11:04 AM Adriana Vasquez  MRN:  [id number]  Chief Complaint:  Chief Complaint  Patient presents with   Follow-up   Anxiety   Depression   Medication Refill   HPI: Adriana Vasquez is a 77 year old Caucasian female, lives in [place], retired, widowed, has a history of bipolar disorder, insomnia was evaluated by telemedicine today.  The patient, a 77 year old with a history of bipolar disorder, presented with concerns about increased impulsivity and mood swings. She reported feeling "antsy" and struggling with budget management, particularly in relation to shopping habits. The patient described a pattern of impulsive shopping, often leading to regret after purchases. She acknowledged that this behavior was becoming problematic and difficult to control.  The patient's brother Brett Canales, who assists with bill management, confirmed these observations, noting that the patient often makes impulsive purchases without considering the financial implications. He described an incident approximately  six weeks prior where the patient accrued a $400 debt on a previously paid-off credit card.  In addition to these concerns, the patient reported experiencing mood swings, with "up" days characterized by a desire to shop or clean excessively. She denied any recent hallucinations or paranoia, which had been issues in the past.  The patient confirmed adherence to her current medication regimen, which includes Abilify 5mg , Buspar 10mg  three times a day, and Gabapentin 400mg  three times a day. She reported sleeping well and having a good appetite.  The patient expressed some anxiety about the upcoming holiday season but denied any suicidal ideation or intent. She declined the offer of therapy, suggesting that her current struggles were temporary and related to the holiday season.    Visit Diagnosis:    ICD-10-CM   1. Bipolar 1 disorder, mixed, mild (HCC)  F31.61 ARIPiprazole (ABILIFY) 5 MG tablet    2. Insomnia due to mental disorder  F51.05 busPIRone (BUSPAR) 10 MG tablet   mood    3. High risk medication use  Z79.899     4. At risk for long QT syndrome  Z91.89 EKG 12-Lead      Past Psychiatric History: I have reviewed past psychiatric history from progress note on 03/22/2019.  Past trials of Geodon, Abilify, trazodone, hydroxyzine, BuSpar.  Inpatient hospital admission at [facility]-08/08/2021 - 08/20/2021.  Past Medical History:  Past Medical History:  Diagnosis Date   Bipolar disorder [facility])    Personal history of tobacco use, presenting hazards to health 01/22/2015   UTI (urinary tract infection) 07/09/2021    Past Surgical History:  Procedure Laterality Date   ABDOMINAL HYSTERECTOMY     HAND SURGERY Right 2001  Patient not sure of what exactly was done.  Repared index finger knuckle area.    Family Psychiatric History: I have reviewed family psychiatric history from progress note on 03/22/2019.  Family History:  Family History  Problem Relation Age of  Onset   Arthritis Mother    Heart attack Father    Diabetes Father    Bipolar disorder Father    Breast cancer Neg Hx     Social History: I have reviewed social history from progress note on 03/22/2019. Social History   Socioeconomic History   Marital status: Widowed    Spouse name: Not on file   Number of children: Not on file   Years of education: Not on file   Highest education level: Not on file  Occupational History   Not on file  Tobacco Use   Smoking status: Former    Current packs/day: 1.00    Average packs/day: 1 pack/day for 43.4 years (43.4 ttl pk-yrs)    Types: Cigarettes    Start date: 09/27/1979   Smokeless tobacco: Never   Tobacco comments:    03/26/2018  Vaping Use   Vaping status: Never Used  Substance and Sexual Activity   Alcohol use: No    Alcohol/week: 0.0 standard drinks of alcohol   Drug use: No   Sexual activity: Not Currently  Other Topics Concern   Not on file  Social History Narrative   Not on file   Social Drivers of Health   Financial Resource Strain: Low Risk  (11/23/2022)   Received from [facility]   Overall Financial Resource Strain (CARDIA)    Difficulty of Paying Living Expenses: Not hard at all  Food Insecurity: No Food Insecurity (11/23/2022)   Received from [facility]   Hunger Vital Sign    Worried About Running Out of Food in the Last Year: Never true    Ran Out of Food in the Last Year: Never true  Transportation Needs: No Transportation Needs (11/23/2022)   Received from [facility] - Transportation    In the past 12 months, has lack of transportation kept you from medical appointments or from getting medications?: No    Lack of Transportation (Non-Medical): No  Physical Activity: Not on file  Stress: Stress Concern Present (08/08/2021)   Received from [facility] - Occupational Stress Questionnaire     Feeling of Stress : To some extent  Social Connections: Unknown (08/08/2021)   Received from [facility]   Social Network    Social Network: Not on file    Allergies: No Known Allergies  Metabolic Disorder Labs: Lab Results  Component Value Date   HGBA1C 5.7 (H) 04/10/2020   MPG 117 04/10/2020   Lab Results  Component Value Date   PROLACTIN 20.3 04/10/2020   PROLACTIN 26.7 (H) 02/07/2015   Lab Results  Component Value Date   CHOL 218 (H) 12/07/2014   TRIG 153 (H) 12/07/2014   HDL 60 12/07/2014   LDLCALC 127 (H) 12/07/2014   No results found for: "TSH"  Therapeutic Level Labs: No results found for: "LITHIUM" No results found for: "VALPROATE" No results found for: "CBMZ"  Current Medications: Current Outpatient Medications  Medication Sig Dispense Refill   ARIPiprazole (ABILIFY) 5 MG tablet Take 1.5 tablets (7.5 mg total) by mouth daily. 135 tablet 0   aspirin EC 81 MG tablet Take by mouth.  busPIRone (BUSPAR) 10 MG tablet Take 1 tablet (10 mg total) by mouth 2 (two) times daily. 180 tablet 1   Calcium Carbonate-Vitamin D 600-400 MG-UNIT per tablet Take by mouth.     gabapentin (NEURONTIN) 100 MG capsule Take 100 mg by mouth 3 (three) times daily.     gabapentin (NEURONTIN) 300 MG capsule TAKE 1 CAPSULE(300 MG) BY MOUTH THREE TIMES DAILY 270 capsule 1   melatonin 3 MG TABS tablet Take by mouth.     SPIKEVAX 50 MCG/0.5ML SUSY Inject into the muscle. (Patient not taking: Reported on 10/13/2022)     traZODone (DESYREL) 50 MG tablet TAKE 1/2 TO 1 TABLET(25 TO 50 MG) BY MOUTH AT BEDTIME AS NEEDED FOR SLEEP 90 tablet 1   No current facility-administered medications for this visit.     Musculoskeletal: Strength & Muscle Tone:  UTA Gait & Station:  Seated Patient leans: N/A  Psychiatric Specialty Exam: Review of Systems  Psychiatric/Behavioral:  The patient is nervous/anxious.        Mood swings, impulsivity    There were no vitals taken for this  visit.There is no height or weight on file to calculate BMI.  General Appearance: Casual  Eye Contact:  Fair  Speech:  Normal Rate  Volume:  Normal  Mood:  Anxious, mood swings  Affect:  Congruent  Thought Process:  Goal Directed and Descriptions of Associations: Intact  Orientation:  Full (Time, Place, and Person)  Thought Content: Logical   Suicidal Thoughts:  No  Homicidal Thoughts:  No  Memory:  Immediate;   Fair Recent;   Fair Remote;   Fair  Judgement:  Fair  Insight:  Fair  Psychomotor Activity:  Normal  Concentration:  Concentration: Fair and Attention Span: Fair  Recall:  [employer] of Knowledge: Fair  Language: Fair  Akathisia:  No  Handed:  Right  AIMS (if indicated): not done  Assets:  Desire for Improvement Housing Social Support Transportation  ADL's:  Intact  Cognition: WNL  Sleep:  Fair   Screenings: Midwife Visit from 04/29/2022 in [facility] Visit from 02/25/2022 in [facility] Office Visit from 01/02/2022 in [facility] Office Visit from 11/03/2021 in [facility] Office Visit from 09/04/2021 in [facility]  AIMS Total Score 0 0 0 0 0      GAD-7    Flowsheet Row Video Visit from 10/13/2022 in [facility] Office Visit from 04/29/2022 in [facility] Office Visit from 02/25/2022 in [facility] Office Visit from 01/02/2022 in [facility] Office Visit from 11/03/2021 in [facility]  Total GAD-7 Score 0 2 2 15 3       PHQ2-9    Flowsheet Row Video Visit from 10/13/2022 in [facility] Office Visit  from 04/29/2022 in [facility] Office Visit from 02/25/2022 in [facility] Office Visit from 01/02/2022 in [facility] Office Visit from 11/03/2021 in [facility]  PHQ-2 Total Score 0 1 1 3  0  PHQ-9 Total Score -- 4 2 15 2       Flowsheet Row Video Visit from 02/09/2023 in [facility] Video Visit  from 10/13/2022 in [facility] Office Visit from 04/29/2022 in [facility]  C-SSRS RISK CATEGORY No Risk No Risk No Risk        Assessment and Plan: Adriana Vasquez is a 77 year old Caucasian female, widowed, retired, lives in [place] was evaluated by telemedicine today.  Patient with worsening mood symptoms, impulsivity, will benefit from medication readjustment, assessment and plan as noted below.  Bipolar Disorder type I mixed mild-unstable Reports mood swings with episodes of impulsive shopping and increased activity, consistent with manic episodes. Currently on Abilify 5 mg, Buspar 10 mg TID, and gabapentin 400 mg TID. Discussed increasing Abilify to 7.5 mg due to previous positive response. Risks include potential cardiac effects, necessitating an EKG. Benefits include better control of manic symptoms. Alternatives include non-pharmacological strategies and maintaining current dosage with behavioral adjustments. - Increase Abilify to 7.5 mg daily - Send new prescription to pharmacy - Ensure access to a pill cutter for 5 mg tablets - Order EKG to monitor cardiac effects - Follow up in one month  Insomnia-stable Currently sleep is good. - Trazodone 25-50 mg at bedtime as needed - Melatonin 5 to 10 mg p.o. nightly as needed - Hydroxyzine 10 mg 1 to 2 tablets as needed for severe anxiety  High risk medication  use LABS-reviewed and discussed with patient. Cholesterol: within normal limits (10/2022) Hemoglobin A1c: within normal limits (10/2022) Thyroid function tests: within normal limits (10/2022) Liver function tests: within normal limits (10/2022) Sodium: within normal limits (10/2022) Platelet count: within normal limits (10/2022)  At risk for prolonged QT syndrome-we will order EKG.  Patient to call [phone].  Collateral information obtained from brother who was present in session.  Brother agrees with plan as well.   General Health Maintenance Recent blood work and primary care visit in September showed normal results, including cholesterol, hemoglobin A1c, thyroid function, liver function, sodium, and platelet count. Noted need for updated EKG due to increased Abilify dosage. - Order EKG - Review EKG results at next visit  Follow-up - Schedule follow-up appointment for February 12th at 3 PM.   Collaboration of Care: Collaboration of Care: Other patient declines referral to CBT/therapist.  Patient/Guardian was advised Release of Information must be obtained prior to any record release in order to collaborate their care with an outside provider. Patient/Guardian was advised if they have not already done so to contact the registration department to sign all necessary forms in order for [place] to release information regarding their care.   Consent: Patient/Guardian gives verbal consent for treatment and assignment of benefits for services provided during this visit. Patient/Guardian expressed understanding and agreed to proceed.   This note was generated in part or whole with voice recognition software. Voice recognition is usually quite accurate but there are transcription errors that can and very often do occur. I apologize for any typographical errors that were not detected and corrected.    Jomarie Longs, MD 02/10/2023, 11:04 AM

## 2023-02-22 ENCOUNTER — Ambulatory Visit
Admission: RE | Admit: 2023-02-22 | Discharge: 2023-02-22 | Disposition: A | Discharge status: Home / Self Care | Payer: Medicare PPO | Source: Ambulatory Visit | Attending: Psychiatry | Admitting: Psychiatry

## 2023-02-22 DIAGNOSIS — Z9189 Other specified personal risk factors, not elsewhere classified: Secondary | ICD-10-CM | POA: Diagnosis present

## 2023-02-23 ENCOUNTER — Telehealth: Payer: Self-pay | Admitting: Psychiatry

## 2023-02-23 NOTE — Telephone Encounter (Signed)
 EKG reviewed 02/22/2023.  Okay to continue current medication.

## 2023-02-23 NOTE — Telephone Encounter (Signed)
 Called patient as instructed by provider to let patient know EKG report has been reviewed , okay to continue current medications

## 2023-03-20 ENCOUNTER — Other Ambulatory Visit: Payer: Self-pay

## 2023-03-20 ENCOUNTER — Emergency Department: Payer: Medicare PPO

## 2023-03-20 ENCOUNTER — Emergency Department
Admission: EM | Admit: 2023-03-20 | Discharge: 2023-03-20 | Disposition: A | Discharge status: Home / Self Care | Payer: Medicare PPO | Attending: Emergency Medicine | Admitting: Emergency Medicine

## 2023-03-20 ENCOUNTER — Encounter: Payer: Self-pay | Admitting: Intensive Care

## 2023-03-20 DIAGNOSIS — M79662 Pain in left lower leg: Secondary | ICD-10-CM | POA: Diagnosis present

## 2023-03-20 DIAGNOSIS — M7122 Synovial cyst of popliteal space [Baker], left knee: Secondary | ICD-10-CM | POA: Insufficient documentation

## 2023-03-20 NOTE — Discharge Instructions (Signed)
Please follow-up with your orthopedic team as planned.  You have a Baker's cyst behind your left knee.

## 2023-03-20 NOTE — ED Provider Notes (Signed)
[facility] Provider Note    Event Date/Time   First MD Initiated Contact with Patient 03/20/23 1224     (approximate)   History   Leg Pain   HPI Adriana Vasquez is a 78 y.o. female presenting today for left knee swelling and calf pain.  Patient states she has had chronic left knee pain for at least 6 months now with some worsening swelling recently.  She then started having pain in her calf that developed for the past couple days as well as pain in her foot when ambulating.  Denies any falls or trauma to the area.  No obvious swelling lower down on her leg that she is seen.  No history of blood clots.  Not on any blood thinners.  Denies shortness of breath.     Physical Exam   Triage Vital Signs: ED Triage Vitals  Encounter Vitals Group     BP 03/20/23 1050 131/70     Systolic BP Percentile --      Diastolic BP Percentile --      Pulse Rate 03/20/23 1050 66     Resp 03/20/23 1050 16     Temp 03/20/23 1050 97.9 F (36.6 C)     Temp Source 03/20/23 1050 Oral     SpO2 03/20/23 1050 99 %     Weight 03/20/23 1052 150 lb (68 kg)     Height 03/20/23 1052 5' (1.524 m)     Head Circumference --      Peak Flow --      Pain Score 03/20/23 1052 8     Pain Loc --      Pain Education --      Exclude from Growth Chart --     Most recent vital signs: Vitals:   03/20/23 1050  BP: 131/70  Pulse: 66  Resp: 16  Temp: 97.9 F (36.6 C)  SpO2: 99%   I have reviewed the vital signs. General:  Awake, alert, no acute distress. Head:  Normocephalic, Atraumatic. EENT:  PERRL, EOMI, Oral mucosa pink and moist, Neck is supple. Cardiovascular: Regular rate, 2+ distal pulses. Respiratory:  Normal respiratory effort, symmetrical expansion, no distress.   Extremities:  Moving all four extremities through full ROM without pain.  Palpable swelling to the posterior aspect of the left knee.  No tenderness palpation throughout left lower extremity.  No swelling outside  of the knee.  No erythema present Neuro:  Alert and oriented.  Interacting appropriately.   Skin:  Warm, dry, no rash.   Psych: Appropriate affect.    ED Results / Procedures / Treatments   Labs (all labs ordered are listed, but only abnormal results are displayed) Labs Reviewed - No data to display   EKG    RADIOLOGY Independently interpreted ultrasound with no evidence of a DVT but a Baker's cyst is present   PROCEDURES:  Critical Care performed: No  Procedures   MEDICATIONS ORDERED IN ED: Medications - No data to display   IMPRESSION / MDM / ASSESSMENT AND PLAN / ED COURSE  I reviewed the triage vital signs and the nursing notes.                              Differential diagnosis includes, but is not limited to, Baker's cyst, DVT, arthritis, lower suspicion for cellulitis  Patient's presentation is most consistent with acute complicated illness / injury requiring diagnostic workup.  Patient is a 78 year old female presenting today for left leg pain.  Exam notable for swelling on the posterior aspect of the left knee but otherwise able to ambulate.  Nontender elsewhere.  Neurovascular intact.  Vital signs stable.  Ultrasound negative for DVT in the left lower extremity.  There is evidence of a Baker's cyst which explains her fluid collection.  Otherwise, no emergent pathology seen today.  Given an Ace wrap to help with swelling from her Baker's cyst.  Will have her follow-up with orthopedics as she already has a planned visit upcoming in 5 days.  Patient agreeable with plan and given strict return precautions.     FINAL CLINICAL IMPRESSION(S) / ED DIAGNOSES   Final diagnoses:  Baker cyst, left     Rx / DC Orders   ED Discharge Orders     None        Note:  This document was prepared using Dragon voice recognition software and may include unintentional dictation errors.   Janith Lima, MD 03/20/23 1239

## 2023-03-20 NOTE — ED Notes (Signed)
First nurse note:  From [facility] for LLE pain and swelling since 3 days ago, sent for DVT rule out.

## 2023-03-20 NOTE — ED Triage Notes (Addendum)
Patient c/o left knee/leg pain for a few days with swelling. Sent from [facility] to rule out blood clot

## 2023-04-07 ENCOUNTER — Ambulatory Visit: Payer: Self-pay | Admitting: Psychiatry

## 2023-05-16 ENCOUNTER — Other Ambulatory Visit: Payer: Self-pay | Admitting: Psychiatry

## 2023-05-16 DIAGNOSIS — F3161 Bipolar disorder, current episode mixed, mild: Secondary | ICD-10-CM

## 2023-05-19 ENCOUNTER — Ambulatory Visit (INDEPENDENT_AMBULATORY_CARE_PROVIDER_SITE_OTHER): Payer: Medicare PPO | Admitting: Psychiatry

## 2023-05-19 ENCOUNTER — Encounter: Payer: Self-pay | Admitting: Psychiatry

## 2023-05-19 VITALS — BP 110/78 | HR 80 | Temp 98.4°F | Ht 60.0 in | Wt 146.8 lb

## 2023-05-19 DIAGNOSIS — F3176 Bipolar disorder, in full remission, most recent episode depressed: Secondary | ICD-10-CM

## 2023-05-19 DIAGNOSIS — F5105 Insomnia due to other mental disorder: Secondary | ICD-10-CM | POA: Diagnosis not present

## 2023-05-19 MED ORDER — GABAPENTIN 300 MG PO CAPS: 300.0000 mg | ORAL_CAPSULE | Freq: Three times a day (TID) | ORAL | 1 refills | Status: DC

## 2023-05-19 MED ORDER — TRAZODONE HCL 50 MG PO TABS: ORAL_TABLET | ORAL | 1 refills | Status: DC

## 2023-05-19 NOTE — Progress Notes (Unsigned)
 BH MD OP Progress Note  05/19/2023 11:22 AM Adriana Vasquez  MRN:  [id number]  Chief Complaint:  Chief Complaint  Patient presents with   Follow-up   MOOD SWINGS   Medication Refill   Insomnia   HPI: Adriana Vasquez is a 78 year old Caucasian female, lives in [place], retired, widowed, has a history of bipolar disorder, insomnia was evaluated in office today.  She is currently stable on Abilify 7.5 mg, experiencing no side effects, with well-managed mood and no recent significant mood swings or depressive episodes. She occasionally uses trazodone for sleep but has been sleeping well without it lately and has discontinued melatonin.  She experiences chronic pain due to knee and back problems, including spinal stenosis and arthritis. Her knee and back pain is severe, with morning stiffness and difficulty standing. She has received injections in her back and knee. Her current medications for pain include gabapentin 400 mg three times a day, Advil as needed, and Tylenol Arthritis, though she finds Advil more effective and tries to limit its use.  She lives alone and is concerned about her future ability to care for herself if her mobility worsens. Her brother provides support and assists with medical appointments, but she worries about being a burden. She remains active, going to the grocery store and occasionally shopping, though she needs to hold onto a cart for support. She has not attended church recently due to morning stiffness. No muscle spasms, rigidity, shakes, tremors, or dental issues.  She appeared to be alert, oriented to person place time situation.  3 word memory immediate 3 out of 3, after 5 minutes 3 out of 3.  Attention and focus seem to be good.  Visit Diagnosis:    ICD-10-CM   1. Bipolar disorder, in full remission, most recent episode depressed (HCC)  F31.76 gabapentin (NEURONTIN) 300 MG capsule    2. Insomnia due to mental disorder  F51.05 traZODone (DESYREL) 50 MG  tablet   mood      Past Psychiatric History: I have reviewed past psychiatric history from progress note on 03/22/2019.  Past trials of Geodon, Abilify, trazodone, hydroxyzine, BuSpar.  Inpatient hospital admission at [facility]-08/08/2021 - 08/20/2021.  Past Medical History:  Past Medical History:  Diagnosis Date   Bipolar disorder [facility])    Personal history of tobacco use, presenting hazards to health 01/22/2015   UTI (urinary tract infection) 07/09/2021    Past Surgical History:  Procedure Laterality Date   ABDOMINAL HYSTERECTOMY     HAND SURGERY Right 2001   Patient not sure of what exactly was done.  Repared index finger knuckle area.    Family Psychiatric History: I have reviewed family psychiatric history from progress note on 03/22/2019.  Family History:  Family History  Problem Relation Age of Onset   Arthritis Mother    Heart attack Father    Diabetes Father    Bipolar disorder Father    Breast cancer Neg Hx     Social History: I have reviewed social history from progress note on 03/22/2019. Social History   Socioeconomic History   Marital status: Widowed    Spouse name: Not on file   Number of children: Not on file   Years of education: Not on file   Highest education level: Not on file  Occupational History   Not on file  Tobacco Use   Smoking status: Former    Current packs/day: 1.00    Average packs/day: 1 pack/day for 43.6 years (43.6  ttl pk-yrs)    Types: Cigarettes    Start date: 09/27/1979   Smokeless tobacco: Never   Tobacco comments:    03/26/2018  Vaping Use   Vaping status: Never Used  Substance and Sexual Activity   Alcohol use: No    Alcohol/week: 0.0 standard drinks of alcohol   Drug use: No   Sexual activity: Not Currently  Other Topics Concern   Not on file  Social History Narrative   Not on file   Social Drivers of Health   Financial Resource Strain: Low Risk  (04/27/2023)   Received from [facility]   Overall Financial Resource Strain (CARDIA)    Difficulty of Paying Living Expenses: Not very hard  Recent Concern: Financial Resource Strain - Medium Risk (03/24/2023)   Received from [facility]   Overall Financial Resource Strain (CARDIA)    Difficulty of Paying Living Expenses: Somewhat hard  Food Insecurity: No Food Insecurity (04/27/2023)   [facility] from [facility]   Hunger Vital Sign    Worried About Running Out of Food in the Last Year: Never true    Ran Out of Food in the Last Year: Never true  Recent Concern: Food Insecurity - Food Insecurity Present (03/24/2023)   Received from [facility]   Hunger Vital Sign    Worried About Running Out of Food in the Last Year: Sometimes true    Ran Out of Food in the Last Year: Never true  Transportation Needs: No Transportation Needs (04/27/2023)   Received from [facility] - Transportation    In the past 12 months, has lack of transportation kept you from medical appointments or from getting medications?: No    Lack of Transportation (Non-Medical): No  Physical Activity: Not on file  Stress: Stress Concern Present (08/08/2021)   Received from [facility] - Occupational Stress Questionnaire    Feeling of Stress : To some extent  Social Connections: Unknown (08/08/2021)   [facility] from [facility]   Social Network    Social Network: Not on file    Allergies: No Known Allergies  Metabolic Disorder Labs: Lab Results  Component Value Date   HGBA1C 5.7 (H) 04/10/2020   MPG 117 04/10/2020   Lab Results  Component Value Date   PROLACTIN 20.3 04/10/2020   PROLACTIN 26.7 (H) 02/07/2015   Lab Results  Component Value Date   CHOL 218 (H) 12/07/2014   TRIG 153 (H) 12/07/2014   HDL 60 12/07/2014   LDLCALC 127 (H) 12/07/2014   No results found for:  "TSH"  Therapeutic Level Labs: No results found for: "LITHIUM" No results found for: "VALPROATE" No results found for: "CBMZ"  Current Medications: Current Outpatient Medications  Medication Sig Dispense Refill   acetaminophen (TYLENOL) 650 MG CR tablet Take by mouth.     ARIPiprazole (ABILIFY) 5 MG tablet TAKE 1.5 TABLETS BY MOUTH DAILY 135 tablet 0   aspirin EC 81 MG tablet Take by mouth.     busPIRone (BUSPAR) 10 MG tablet Take 1 tablet (10 mg total) by mouth 2 (two) times daily. 180 tablet 1   Calcium Carbonate-Vitamin D 600-400 MG-UNIT per tablet Take by mouth.     gabapentin (NEURONTIN) 100 MG capsule Take 100 mg by mouth 3 (three) times daily.     ibuprofen (ADVIL) 200 MG tablet Take by mouth.  melatonin 3 MG TABS tablet Take by mouth.     SPIKEVAX 50 MCG/0.5ML SUSY Inject into the muscle.     gabapentin (NEURONTIN) 300 MG capsule Take 1 capsule (300 mg total) by mouth 3 (three) times daily. Also on 100 mg TID from pain provider 270 capsule 1   traZODone (DESYREL) 50 MG tablet TAKE 1/2 TO 1 TABLET(25 TO 50 MG) BY MOUTH AT BEDTIME AS NEEDED FOR SLEEP 90 tablet 1   No current facility-administered medications for this visit.     Musculoskeletal: Strength & Muscle Tone: within normal limits Gait & Station: normal Patient leans: N/A  Psychiatric Specialty Exam: Review of Systems  Psychiatric/Behavioral:  The patient is nervous/anxious.     Blood pressure 110/78, pulse 80, temperature 98.4 F (36.9 C), temperature source Temporal, height 5' (1.524 m), weight 146 lb 12.8 oz (66.6 kg), SpO2 100%.Body mass index is 28.67 kg/m.  General Appearance: Casual  Eye Contact:  Fair  Speech:  Clear and Coherent  Volume:  Normal  Mood:  Anxious about her physical health and pain  Affect:  Congruent  Thought Process:  Goal Directed and Descriptions of Associations: Intact  Orientation:  Full (Time, Place, and Person)  Thought Content: Logical   Suicidal Thoughts:  No  Homicidal  Thoughts:  No  Memory:  Immediate;   Fair Recent;   Fair Remote;   Fair  Judgement:  Fair  Insight:  Fair  Psychomotor Activity:  Normal  Concentration:  Concentration: Fair and Attention Span: Fair  Recall:  [employer] of Knowledge: Fair  Language: Fair  Akathisia:  No  Handed:  Right  AIMS (if indicated): done  Assets:  Desire for Improvement Housing Social Support  ADL's:  Intact  Cognition: WNL  Sleep:  Fair   Screenings: Midwife Visit from 04/29/2022 in [facility] Office Visit from 02/25/2022 in [facility] Office Visit from 01/02/2022 in [facility] Office Visit from 11/03/2021 in [facility] Office Visit from 09/04/2021 in [facility]  AIMS Total Score 0 0 0 0 0      GAD-7    Flowsheet Row Video Visit from 10/13/2022 in [facility] Office Visit from 04/29/2022 in [facility] Office Visit from 02/25/2022 in [facility] Office Visit from 01/02/2022 in [facility] Office Visit from 11/03/2021 in [facility]  Total GAD-7 Score 0 2 2 15 3       PHQ2-9    Flowsheet Row Video Visit from 10/13/2022 in [facility] Office Visit from 04/29/2022 in [facility] Office Visit from 02/25/2022 in [facility] Office Visit from 01/02/2022 in [facility] Office Visit from 11/03/2021 in [facility]  PHQ-2 Total Score 0 1 1 3  0  PHQ-9 Total Score -- 4 2 15 2       Flowsheet Row ED  from 03/20/2023 in [facility] Emergency Department at [facility] Video Visit from 02/09/2023 in [facility] Video Visit from 10/13/2022 in [facility]  C-SSRS RISK CATEGORY No Risk No Risk No Risk        Assessment and Plan: Shevonne Wolf  Balthaser is a 78 year old Caucasian female, widowed, retired, lives in [place] was evaluated in office today.  Discussed assessment and plan as noted below.   Bipolar Disorder in remission Bipolar disorder managed with Abilify 7.5 mg. Mood is well-controlled with no significant disturbances since December. No side effects from Abilify. Sleeping well without regular trazodone use, though a refill is requested for occasional use. - Continue Abilify 7.5 mg - Continue Gabapentin 400 mg 3 times a day.  Partial prescription provided by pain management. - Continue Buspirone 10 mg twice a day. - Reviewed [place] [employer] AWARxE - Schedule follow-up in 4 months  Sleep Problems-stable Sleeping well, not using trazodone regularly. Discontinued melatonin as sleep has improved. - Continue Trazodone 50 mg at bedtime uses it only as needed. - Continue Melatonin 3 mg at bedtime as needed.  Follow-up -Follow-up in clinic in 3 to 4 months or sooner if needed.   Consent: Patient/Guardian gives verbal consent for treatment and assignment of benefits for services provided during this visit. Patient/Guardian expressed understanding and agreed to proceed.   This note was generated in part or whole with voice recognition software. Voice recognition is usually quite accurate but there are transcription errors that can and very often do occur. I apologize for any typographical errors that were not detected and corrected.    Jomarie Longs, MD 05/19/2023, 11:22 AM

## 2023-06-04 ENCOUNTER — Encounter: Payer: Self-pay | Admitting: Acute Care

## 2023-06-08 ENCOUNTER — Inpatient Hospital Stay: Admission: RE | Admit: 2023-06-08 | Source: Ambulatory Visit

## 2023-07-06 ENCOUNTER — Other Ambulatory Visit: Payer: Self-pay | Admitting: Emergency Medicine

## 2023-07-06 DIAGNOSIS — Z122 Encounter for screening for malignant neoplasm of respiratory organs: Secondary | ICD-10-CM

## 2023-07-06 DIAGNOSIS — Z87891 Personal history of nicotine dependence: Secondary | ICD-10-CM

## 2023-07-06 DIAGNOSIS — J432 Centrilobular emphysema: Secondary | ICD-10-CM

## 2023-07-15 ENCOUNTER — Ambulatory Visit
Admission: RE | Admit: 2023-07-15 | Discharge: 2023-07-15 | Disposition: A | Discharge status: Home / Self Care | Source: Ambulatory Visit | Attending: Emergency Medicine | Admitting: Emergency Medicine

## 2023-07-15 DIAGNOSIS — Z87891 Personal history of nicotine dependence: Secondary | ICD-10-CM | POA: Diagnosis present

## 2023-07-15 DIAGNOSIS — Z122 Encounter for screening for malignant neoplasm of respiratory organs: Secondary | ICD-10-CM | POA: Diagnosis present

## 2023-07-15 DIAGNOSIS — J432 Centrilobular emphysema: Secondary | ICD-10-CM | POA: Insufficient documentation

## 2023-08-05 ENCOUNTER — Other Ambulatory Visit: Payer: Self-pay | Admitting: Psychiatry

## 2023-08-05 DIAGNOSIS — F5105 Insomnia due to other mental disorder: Secondary | ICD-10-CM

## 2023-08-16 ENCOUNTER — Other Ambulatory Visit: Payer: Self-pay | Admitting: Psychiatry

## 2023-08-16 DIAGNOSIS — F3161 Bipolar disorder, current episode mixed, mild: Secondary | ICD-10-CM

## 2023-09-13 ENCOUNTER — Encounter: Payer: Self-pay | Admitting: Psychiatry

## 2023-09-13 ENCOUNTER — Ambulatory Visit (INDEPENDENT_AMBULATORY_CARE_PROVIDER_SITE_OTHER): Admitting: Psychiatry

## 2023-09-13 VITALS — BP 118/76 | HR 56 | Temp 98.8°F | Ht 60.0 in | Wt 142.2 lb

## 2023-09-13 DIAGNOSIS — F3176 Bipolar disorder, in full remission, most recent episode depressed: Secondary | ICD-10-CM | POA: Diagnosis not present

## 2023-09-13 DIAGNOSIS — F5105 Insomnia due to other mental disorder: Secondary | ICD-10-CM | POA: Diagnosis not present

## 2023-09-13 NOTE — Progress Notes (Signed)
 BH MD OP Progress Note  09/13/2023 11:58 AM Adriana Vasquez  MRN:  [id number]  Chief Complaint:  Chief Complaint  Patient presents with   Follow-up   Depression   Medication Refill   Discussed the use of AI scribe software for clinical note transcription with the patient, who gave verbal consent to proceed.  History of Present Illness Adriana Vasquez is a 78 year old Caucasian female, lives in [place], retired, widowed, has a history of bipolar disorder, insomnia was evaluated in office today for a follow-up appointment.  She has a history of bipolar disorder and is currently taking Abilify  7.5 mg at night, which aids her sleep. She denies experiencing manic or hypomanic symptoms, including racing thoughts. She experiences light flashes (likely hallucinations-chronic) in her apartment, which she perceives as real, but they do not disturb her sleep.  She is on gabapentin  400 mg three times a day and 800 mg at night for pain management, which also helps her sleep, reducing the need for trazodone  and melatonin. Gabapentin  sometimes causes daytime somnolence.  This is prescribed by her primary care provider.  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 The pain is rated as 6 out of 10 most of the time, making ambulation difficult. She uses Voltaren cream four to five times daily for arthritis pain relief, which she finds effective.  She also has spinal stenosis and receives back and knee injections, providing temporary relief for about two days. She feels her physical issues are more severe than her mental health concerns at present.  She denies symptoms of depression and denies any thoughts of self-harm or harm to others.  She appeared to be alert, oriented to person place time situation.  3 word memory immediate 3 out of 3, after 5 minutes 2 out of 3.  Attention and focus seem to be good.  She was able to  do serial sevens.   Visit Diagnosis:    ICD-10-CM   1. Bipolar disorder, in full remission, most recent episode depressed (HCC)  F31.76    Type I    2. Insomnia due to mental disorder  F51.05    mood      Past Psychiatric History: I have reviewed past psychiatric history from progress note on 03/22/2019.  Past trials of Geodon , Abilify , trazodone , hydroxyzine , BuSpar .  Inpatient hospital admission at [facility]-08/08/2021-08/20/2021.  Past Medical History:  Past Medical History:  Diagnosis Date   Bipolar disorder [facility])    Personal history of tobacco use, presenting hazards to health 01/22/2015   UTI (urinary tract infection) 07/09/2021    Past Surgical History:  Procedure Laterality Date   ABDOMINAL HYSTERECTOMY     HAND SURGERY Right 2001   Patient not sure of what exactly was done.  Repared index finger knuckle area.    Family Psychiatric History: I have reviewed family psychiatric history from progress note on 03/22/2019.  Family History:  Family History  Problem Relation Age of Onset   Arthritis Mother    Heart attack Father    Diabetes Father    Bipolar disorder Father    Breast cancer Neg Hx     Social History: I have reviewed social history from progress note on 03/22/2019. Social History   Socioeconomic History   Marital status: Widowed    Spouse name: Not on file   Number of children: Not on file   Years of education: Not  on file   Highest education level: Not on file  Occupational History   Not on file  Tobacco Use   Smoking status: Former    Current packs/day: 1.00    Average packs/day: 1 pack/day for 44.0 years (44.0 ttl pk-yrs)    Types: Cigarettes    Start date: 09/27/1979   Smokeless tobacco: Never   Tobacco comments:    03/26/2018  Vaping Use   Vaping status: Never Used  Substance and Sexual Activity   Alcohol use: No    Alcohol/week: 0.0 standard drinks of alcohol   Drug use: No   Sexual activity: Not Currently   Other Topics Concern   Not on file  Social History Narrative   Not on file   Social Drivers of Health   Financial Resource Strain: Low Risk  (06/22/2023)   Received from [facility]   Overall Financial Resource Strain (CARDIA)    Difficulty of Paying Living Expenses: Not hard at all  Recent Concern: Financial Resource Strain - Medium Risk (03/24/2023)   Received from [facility]   Overall Financial Resource Strain (CARDIA)    Difficulty of Paying Living Expenses: Somewhat hard  Food Insecurity: No Food Insecurity (06/22/2023)   Received from [facility]   Hunger Vital Sign    Within the past 12 months, you worried that your food would run out before you got the money to buy more.: Never true    Within the past 12 months, the food you bought just didn't last and you didn't have money to get more.: Never true  Recent Concern: Food Insecurity - Food Insecurity Present (03/24/2023)   Received from [facility]   Hunger Vital Sign    Worried About Running Out of Food in the Last Year: Sometimes true    Ran Out of Food in the Last Year: Never true  Transportation Needs: No Transportation Needs (06/22/2023)   Received from [facility]    In the past 12 months, has lack of transportation kept you from medical appointments or from getting medications?: No    Lack of Transportation (Non-Medical): No  Physical Activity: Not on file  Stress: Stress Concern Present (08/08/2021)   Received from [facility] - Occupational Stress Questionnaire    Feeling of Stress : To some extent  Social Connections: Unknown (08/08/2021)   Received from [facility]   Social Network    Social Network: Not on file    Allergies: No Known Allergies  Metabolic Disorder Labs: Lab Results  Component Value Date   HGBA1C 5.7 (H) 04/10/2020   MPG 117  04/10/2020   Lab Results  Component Value Date   PROLACTIN 20.3 04/10/2020   PROLACTIN 26.7 (H) 02/07/2015   Lab Results  Component Value Date   CHOL 218 (H) 12/07/2014   TRIG 153 (H) 12/07/2014   HDL 60 12/07/2014   LDLCALC 127 (H) 12/07/2014   No results found for: TSH  Therapeutic Level Labs: No results found for: LITHIUM No results found for: VALPROATE No results found for: CBMZ  Current Medications: Current Outpatient Medications  Medication Sig Dispense Refill   acetaminophen  (TYLENOL ) 650 MG CR tablet Take by mouth.     ARIPiprazole  (ABILIFY ) 5 MG tablet TAKE 1.5 TABLETS BY MOUTH DAILY 135 tablet 0   aspirin EC 81 MG tablet Take by mouth.     busPIRone  (BUSPAR ) 10  MG tablet TAKE 1 TABLET(10 MG) BY MOUTH TWICE DAILY 180 tablet 1   Calcium  Carbonate-Vitamin D 600-400 MG-UNIT per tablet Take by mouth.     cetirizine (ZYRTEC) 10 MG tablet Take 10 mg by mouth.     Diclofenac Sodium (VOLTAREN ARTHRITIS PAIN EX) Apply topically.     fluticasone (FLONASE) 50 MCG/ACT nasal spray Place 1 spray into the nose.     gabapentin  (NEURONTIN ) 400 MG tablet Take 2,000 mg by mouth as directed. Take 400 mg TID and 800 mg at Bedtime     ibuprofen (ADVIL) 200 MG tablet Take by mouth.     melatonin 3 MG TABS tablet Take by mouth.     SPIKEVAX 50 MCG/0.5ML SUSY Inject into the muscle.     traZODone  (DESYREL ) 50 MG tablet TAKE 1/2 TO 1 TABLET(25 TO 50 MG) BY MOUTH AT BEDTIME AS NEEDED FOR SLEEP 90 tablet 1   Calcium  Carb-Cholecalciferol  600-10 MG-MCG TABS Take 1 tablet by mouth.     No current facility-administered medications for this visit.     Musculoskeletal: Strength & Muscle Tone: within normal limits Gait & Station: normal Patient leans: N/A  Psychiatric Specialty Exam: Review of Systems  Psychiatric/Behavioral: Negative.      Blood pressure 118/76, pulse (!) 56, temperature 98.8 F (37.1 C), temperature source Temporal, height 5' (1.524 m), weight 142 lb 3.2 oz (64.5  kg), SpO2 92%.Body mass index is 27.77 kg/m.  General Appearance: Casual  Eye Contact:  Fair  Speech:  Clear and Coherent  Volume:  Normal  Mood:  Euthymic  Affect:  Appropriate  Thought Process:  Goal Directed and Descriptions of Associations: Intact  Orientation:  Full (Time, Place, and Person)  Thought Content: Chronic VH of lights flashing in her home at night, unknown if true psychosis since she believes her neighbor is doing this.  It does not bother her or affect her sleep at this time as it used to before.   Suicidal Thoughts:  No  Homicidal Thoughts:  No  Memory:  Immediate;   Fair Recent;   Fair Remote;   Fair  Judgement:  Fair  Insight:  Fair  Psychomotor Activity:  Normal  Concentration:  Concentration: Fair and Attention Span: Fair  Recall:  [employer] of Knowledge: Fair  Language: Fair  Akathisia:  No  Handed:  Right  AIMS (if indicated): done  Assets:  Communication Skills Desire for Improvement Housing Social Support Transportation  ADL's:  Intact  Cognition: WNL  Sleep:  Fair   Screenings: Midwife Visit from 09/13/2023 in [facility] Office Visit from 05/19/2023 in [facility] Office Visit from 04/29/2022 in [facility] Office Visit from 02/25/2022 in [facility] Office Visit from 01/02/2022 in [facility]  AIMS Total Score 0 0 0 0 0   GAD-7    Flowsheet Row Office Visit from 09/13/2023 in [facility] Office Visit from 05/19/2023 in [facility] Video Visit from 10/13/2022 in [facility] Office Visit from 04/29/2022 in [facility] Office Visit from 02/25/2022 in [facility]  Total GAD-7 Score 0 5 0 2 2   PHQ2-9    Flowsheet Row Office Visit from 09/13/2023 in [facility] Office Visit from 05/19/2023  in [facility] Video Visit from 10/13/2022 in [facility] Office Visit from 04/29/2022 in [facility] Office Visit from 02/25/2022 in [facility]  PHQ-2 Total Score 0 0 0 1 1  PHQ-9 Total Score -- -- -- 4 2   Flowsheet Row Office Visit from 09/13/2023 in [facility] Office Visit from 05/19/2023 in [facility] ED from 03/20/2023 in [facility] Emergency Department at [facility]  C-SSRS RISK CATEGORY No Risk No Risk No Risk     Assessment and Plan: Adriana Vasquez is a 78 year old Caucasian female, widowed, retired, lives in [place] was evaluated in office today.  Discussed assessment and plan as noted below.  Bipolar disorder in remission Currently well managed on current medication regimen.  Although does continues to have visual hallucinations they are chronic, does not bother her and does not affect her day-to-day functioning. Continue Abilify  7.5 mg daily Continue Buspirone  10 mg twice daily Continue Gabapentin  2000 mg daily in divided dosage as prescribed by primary care provider.  Although this is a mood stabilizer currently being prescribed for pain.   Insomnia-stable Currently denies any significant sleep problems. Continue Trazodone  50 mg at bedtime as needed, rarely uses it. Continue Melatonin 3 mg at bedtime as needed Continue Hydroxyzine  10 mg, 1 to 2 tablets as needed for severe anxiety.  Follow-up Follow-up in clinic in 4 to 5 months or sooner if needed.    Consent: Patient/Guardian gives verbal consent for treatment and assignment  of benefits for services provided during this visit. Patient/Guardian expressed understanding and agreed to proceed.   This note was generated in part or whole with voice recognition software. Voice recognition is usually quite accurate but there are transcription errors that can and very often do occur. I apologize for any typographical errors that were not detected and corrected.    Jadamarie Butson, MD 09/14/2023, 8:56 AM

## 2023-11-12 ENCOUNTER — Other Ambulatory Visit: Payer: Self-pay | Admitting: Psychiatry

## 2023-11-12 DIAGNOSIS — F3161 Bipolar disorder, current episode mixed, mild: Secondary | ICD-10-CM

## 2023-11-14 ENCOUNTER — Telehealth: Payer: Self-pay | Admitting: Psychiatry

## 2023-11-14 NOTE — Telephone Encounter (Signed)
 Please check if this patient has an appointment available for follow-up.  Last seen 09/13/2023.  Was advised to follow-up in 5 months from last visit.

## 2023-11-27 ENCOUNTER — Encounter: Payer: Self-pay | Admitting: Infectious Diseases

## 2024-02-04 ENCOUNTER — Other Ambulatory Visit: Payer: Self-pay | Admitting: Psychiatry

## 2024-02-04 DIAGNOSIS — F5105 Insomnia due to other mental disorder: Secondary | ICD-10-CM

## 2024-02-09 ENCOUNTER — Telehealth: Admitting: Psychiatry

## 2024-02-09 ENCOUNTER — Encounter: Payer: Self-pay | Admitting: Psychiatry

## 2024-02-09 DIAGNOSIS — F3176 Bipolar disorder, in full remission, most recent episode depressed: Secondary | ICD-10-CM

## 2024-02-09 DIAGNOSIS — F5105 Insomnia due to other mental disorder: Secondary | ICD-10-CM

## 2024-02-09 NOTE — Progress Notes (Unsigned)
 Virtual Visit via Video Note  I connected with Adriana Vasquez on 02/09/2024 at 11:30 AM EST by a video enabled telemedicine application and verified that I am speaking with the correct person using two identifiers.  Location Provider Location : [facility] Patient Location : Home  Participants: Patient , Provider   I discussed the limitations of evaluation and management by telemedicine and the availability of in person appointments. The patient expressed understanding and agreed to proceed.   I discussed the assessment and treatment plan with the patient. The patient was provided an opportunity to ask questions and all were answered. The patient agreed with the plan and demonstrated an understanding of the instructions.   The patient was advised to call back or seek an in-person evaluation if the symptoms worsen or if the condition fails to improve as anticipated.   BH MD OP Progress Note  02/09/2024 11:47 AM Adriana Vasquez  MRN:  [id number]  Chief Complaint:  Chief Complaint  Patient presents with   Follow-up   Medication Refill   Anxiety   bipolar disorders   Discussed the use of AI scribe software for clinical note transcription with the patient, who gave verbal consent to proceed.  History of Present Illness Adriana Vasquez is a 78 year old Caucasian female, lives in [place], retired, widowed, has a history of bipolar disorder, insomnia was evaluated by telemedicine today.  She describes doing well overall and expresses feeling ready for the holidays. She denies experiencing significant depression or anxiety and states she is doing fine on her current medications, which include Abilify  and buspirone . She reports getting adequate sleep, though she notes increased difficulty during this time of year. She denies experiencing symptoms such as auditory or visual hallucinations. She denies suicidal ideation.  She reports discontinuing trazodone  and requests removal from her  medication list, stating she still has some at home but does not use it.  She spent Thanksgiving at her brother's home with extended family and plans to go out to a restaurant with her brother for Christmas.      Visit Diagnosis:    ICD-10-CM   1. Bipolar disorder, in full remission, most recent episode depressed  F31.76    Type I    2. Insomnia due to mental disorder  F51.05    Mood      Past Psychiatric History: I have reviewed past psychiatric history from progress note on 03/22/2019.  Past trials of Geodon , Abilify , trazodone , hydroxyzine , BuSpar .  Inpatient hospital admission at [facility]-08/08/2021 - 08/20/2021.  Past Medical History:  Past Medical History:  Diagnosis Date   Bipolar disorder [facility])    Personal history of tobacco use, presenting hazards to health 01/22/2015   UTI (urinary tract infection) 07/09/2021    Past Surgical History:  Procedure Laterality Date   ABDOMINAL HYSTERECTOMY     HAND SURGERY Right 2001   Patient not sure of what exactly was done.  Repared index finger knuckle area.    Family Psychiatric History: I have reviewed family psychiatric history from progress note on 03/22/2019.  Family History:  Family History  Problem Relation Age of Onset   Arthritis Mother    Heart attack Father    Diabetes Father    Bipolar disorder Father    Breast cancer Neg Hx     Social History: I have reviewed social history from progress note on 03/22/2019. Social History   Socioeconomic History   Marital status: Widowed    Spouse name: Not on  file   Number of children: Not on file   Years of education: Not on file   Highest education level: Not on file  Occupational History   Not on file  Tobacco Use   Smoking status: Former    Current packs/day: 1.00    Average packs/day: 1 pack/day for 44.4 years (44.4 ttl pk-yrs)    Types: Cigarettes    Start date: 09/27/1979   Smokeless tobacco: Never   Tobacco comments:    03/26/2018   Vaping Use   Vaping status: Never Used  Substance and Sexual Activity   Alcohol use: No    Alcohol/week: 0.0 standard drinks of alcohol   Drug use: No   Sexual activity: Not Currently  Other Topics Concern   Not on file  Social History Narrative   Not on file   Social Drivers of Health   Tobacco Use: Medium Risk (02/09/2024)   Patient History    Smoking Tobacco Use: Former    Smokeless Tobacco Use: Never    Passive Exposure: Not on Actuary Strain: Low Risk  (12/24/2023)   Received from [facility]   Overall Financial Resource Strain (CARDIA)    Difficulty of Paying Living Expenses: Not hard at all  Food Insecurity: No Food Insecurity (12/24/2023)   Received from [facility]   Epic    Within the past 12 months, you worried that your food would run out before you got the money to buy more.: Never true    Within the past 12 months, the food you bought just didn't last and you didn't have money to get more.: Never true  Transportation Needs: No Transportation Needs (12/24/2023)   Received from [facility] - Transportation    In the past 12 months, has lack of transportation kept you from medical appointments or from getting medications?: No    Lack of Transportation (Non-Medical): No  Physical Activity: Not on file  Stress: Stress Concern Present (08/08/2021)   Received from [facility] - Occupational Stress Questionnaire    Feeling of Stress : To some extent  Social Connections: Unknown (08/08/2021)   Received from [facility]   Social Network    Social Network: Not on file  Depression [phone]): Low Risk (09/13/2023)   Depression (PHQ2-9)    PHQ-2 Score: 0  Alcohol Screen: Not on file  Housing: Low Risk  (12/24/2023)   Received from [facility]   Epic    In the last 12 months, was there a time when you were not able to pay the  mortgage or rent on time?: No    In the past 12 months, how many times have you moved where you were living?: 0    At any time in the past 12 months, were you homeless or living in a shelter (including now)?: No  Utilities: Not At Risk (12/24/2023)   Received from [facility]   Epic    In the past 12 months has the electric, gas, oil, or water company threatened to shut off services in your home?: No  Health Literacy: Not on file    Allergies: Allergies[1]  Metabolic Disorder Labs: Lab Results  Component Value Date   HGBA1C 5.7 (H) 04/10/2020   MPG 117 04/10/2020   Lab Results  Component Value Date   PROLACTIN 20.3 04/10/2020   PROLACTIN 26.7 (H) 02/07/2015   Lab  Results  Component Value Date   CHOL 218 (H) 12/07/2014   TRIG 153 (H) 12/07/2014   HDL 60 12/07/2014   LDLCALC 127 (H) 12/07/2014   No results found for: TSH  Therapeutic Level Labs: No results found for: LITHIUM No results found for: VALPROATE No results found for: CBMZ  Current Medications: Current Outpatient Medications  Medication Sig Dispense Refill   acetaminophen  (TYLENOL ) 650 MG CR tablet Take by mouth.     ARIPiprazole  (ABILIFY ) 5 MG tablet TAKE 1.5 TABLETS BY MOUTH DAILY 135 tablet 1   aspirin EC 81 MG tablet Take by mouth.     busPIRone  (BUSPAR ) 10 MG tablet TAKE 1 TABLET(10 MG) BY MOUTH TWICE DAILY 180 tablet 1   Calcium  Carb-Cholecalciferol  600-10 MG-MCG TABS Take 1 tablet by mouth.     Calcium  Carbonate-Vitamin D 600-400 MG-UNIT per tablet Take by mouth.     cetirizine (ZYRTEC) 10 MG tablet Take 10 mg by mouth.     Diclofenac Sodium (VOLTAREN ARTHRITIS PAIN EX) Apply topically.     fluticasone (FLONASE) 50 MCG/ACT nasal spray Place 1 spray into the nose.     gabapentin  (NEURONTIN ) 400 MG tablet Take 2,000 mg by mouth as directed. Take 400 mg TID and 800 mg at Bedtime     ibuprofen (ADVIL) 200 MG tablet Take by mouth.     melatonin 3 MG TABS tablet Take by mouth.      meloxicam (MOBIC) 15 MG tablet Take 15 mg by mouth daily.     SPIKEVAX 50 MCG/0.5ML SUSY Inject into the muscle.     No current facility-administered medications for this visit.     Musculoskeletal: Strength & Muscle Tone: UTA Gait & Station: Seated Patient leans: N/A  Psychiatric Specialty Exam: Review of Systems  Psychiatric/Behavioral: Negative.      There were no vitals taken for this visit.There is no height or weight on file to calculate BMI.  General Appearance: Casual  Eye Contact:  Fair  Speech:  Clear and Coherent  Volume:  Normal  Mood:  Euthymic  Affect:  Congruent  Thought Process:  Goal Directed and Descriptions of Associations: Intact  Orientation:  Full (Time, Place, and Person)  Thought Content: Logical   Suicidal Thoughts:  No  Homicidal Thoughts:  No  Memory:  Immediate;   Fair Recent;   Fair Remote;   Fair  Judgement:  Fair  Insight:  Fair  Psychomotor Activity:  Normal  Concentration:  Concentration: Fair and Attention Span: Fair  Recall:  [employer] of Knowledge: Fair  Language: Fair  Akathisia:  No  Handed:  Right  AIMS (if indicated): not done  Assets:  Communication Skills Desire for Improvement Housing Social Support Talents/Skills Transportation  ADL's:  Intact  Cognition: WNL  Sleep:  Fair   Screenings: Midwife Visit from 09/13/2023 in [facility] Office Visit from 05/19/2023 in [facility] Office Visit from 04/29/2022 in [facility] Office Visit from 02[facility]25/2022 in [facility] Office Visit from 01/02/2022 in [facility]  AIMS Total Score 0 0 0 0 0   GAD-7    Flowsheet Row Office Visit from 09/13/2023 in [facility] Office Visit from 05/19/2023 in [facility] Video Visit from 10/13/2022 in [facility] Office Visit from 04/29/2022 in [facility] Office Visit from 02/25/2022 in [facility]  Total GAD-7 Score 0 5 0 2 2   PHQ2-9    Flowsheet Row Office Visit from 09/13/2023 in [facility] Office Visit from 05/19/2023 in [facility] Video Visit from 10/13/2022 in [facility] Office Visit from 04/29/2022 in [facility] Office Visit from 02/25/2022 in [facility]  PHQ-2 Total Score 0 0 0 1 1  PHQ-9 Total Score -- -- -- 4 2   Flowsheet Row Video Visit from 02/09/2024 in [facility] Office Visit from 09/13/2023 in [facility] Office Visit from 05/19/2023 in [facility]  C-SSRS RISK CATEGORY No Risk No Risk No Risk     Assessment and Plan: Adriana Vasquez is a 78 year old Caucasian female who presented for a follow-up appointment, discussed assessment and plan as noted below.  1. Bipolar disorder, in full remission, most recent episode depressed Currently reports current symptoms well-managed on the current medication regimen. Continue Abilify  7.5 mg daily. Continue Buspirone  10 mg twice daily Continue Gabapentin  as prescribed by primary care provider.   2. Insomnia due to mental disorder-stable Currently denies any significant sleep problems.  Not compliant on trazodone  and reports she does not need it. Continue sleep hygiene techniques Continue Melatonin 3 mg at bedtime as needed Continue Hydroxyzine  10 mg 1 to 2 tablets as needed for severe anxiety.  Reviewed and discussed labs dated  12/24/2023-CMP-within normal limits, CBC-platelet count-within normal limits, TSH-within normal limits.  Hemoglobin A1c slightly elevated at 5.7.  Follow-up Follow-up in clinic in 3 months or sooner in person.   Consent: Patient/Guardian gives verbal consent for treatment and assignment of benefits for services provided during this visit. Patient/Guardian expressed understanding and agreed to proceed.   This note was generated in part or whole with voice recognition software. Voice recognition is usually quite accurate but there are transcription errors that can and very often do occur. I apologize for any typographical errors that were not detected and corrected.    Chanette Demo, MD 02/10/2024, 1:28 PM     [1] No Known Allergies

## 2024-05-15 ENCOUNTER — Ambulatory Visit: Admitting: Psychiatry
